# Patient Record
Sex: Female | Born: 1935
Health system: Southern US, Community
[De-identification: ages and names within clinical notes are randomized; demographics above are authoritative.]

## PROBLEM LIST (undated history)

## (undated) ENCOUNTER — Emergency Department (HOSPITAL_COMMUNITY)
Admission: EM | Disposition: A | Discharge status: Skilled Nursing Facility | Payer: Medicare Other | Attending: Emergency Medicine | Admitting: Emergency Medicine

## (undated) DIAGNOSIS — K219 Gastro-esophageal reflux disease without esophagitis: Secondary | ICD-10-CM

## (undated) DIAGNOSIS — I2510 Atherosclerotic heart disease of native coronary artery without angina pectoris: Secondary | ICD-10-CM

## (undated) DIAGNOSIS — I219 Acute myocardial infarction, unspecified: Secondary | ICD-10-CM

## (undated) DIAGNOSIS — F32A Depression, unspecified: Secondary | ICD-10-CM

## (undated) DIAGNOSIS — M810 Age-related osteoporosis without current pathological fracture: Secondary | ICD-10-CM

## (undated) DIAGNOSIS — E785 Hyperlipidemia, unspecified: Secondary | ICD-10-CM

## (undated) DIAGNOSIS — I10 Essential (primary) hypertension: Secondary | ICD-10-CM

## (undated) DIAGNOSIS — I739 Peripheral vascular disease, unspecified: Secondary | ICD-10-CM

## (undated) DIAGNOSIS — L409 Psoriasis, unspecified: Secondary | ICD-10-CM

## (undated) DIAGNOSIS — E039 Hypothyroidism, unspecified: Secondary | ICD-10-CM

## (undated) DIAGNOSIS — F329 Major depressive disorder, single episode, unspecified: Secondary | ICD-10-CM

## (undated) HISTORY — PX: TUBAL LIGATION: SHX77

## (undated) HISTORY — PX: EYE SURGERY: SHX253

## (undated) HISTORY — DX: Hypothyroidism, unspecified: E03.9

## (undated) HISTORY — PX: CATARACT EXTRACTION: SUR2

## (undated) HISTORY — PX: SUBCLAVIAN ARTERY STENT: SHX2452

## (undated) HISTORY — DX: Peripheral vascular disease, unspecified: I73.9

## (undated) HISTORY — DX: Gastro-esophageal reflux disease without esophagitis: K21.9

## (undated) HISTORY — DX: Major depressive disorder, single episode, unspecified: F32.9

## (undated) HISTORY — PX: ABDOMINAL HYSTERECTOMY: SHX81

## (undated) HISTORY — DX: Hyperlipidemia, unspecified: E78.5

## (undated) HISTORY — PX: CORONARY ANGIOPLASTY WITH STENT PLACEMENT: SHX49

## (undated) HISTORY — DX: Depression, unspecified: F32.A

## (undated) HISTORY — DX: Age-related osteoporosis without current pathological fracture: M81.0

## (undated) HISTORY — DX: Acute myocardial infarction, unspecified: I21.9

## (undated) HISTORY — DX: Atherosclerotic heart disease of native coronary artery without angina pectoris: I25.10

## (undated) HISTORY — PX: LUNG LOBECTOMY: SHX167

## (undated) HISTORY — DX: Essential (primary) hypertension: I10

## (undated) HISTORY — DX: Psoriasis, unspecified: L40.9

---

## 1999-11-11 ENCOUNTER — Inpatient Hospital Stay (HOSPITAL_COMMUNITY): Admission: EM | Admit: 1999-11-11 | Discharge: 1999-11-17 | Payer: Self-pay | Admitting: Internal Medicine

## 2001-12-18 ENCOUNTER — Inpatient Hospital Stay (HOSPITAL_COMMUNITY): Admission: EM | Admit: 2001-12-18 | Discharge: 2001-12-24 | Payer: Self-pay | Admitting: Internal Medicine

## 2001-12-18 ENCOUNTER — Encounter: Payer: Self-pay | Admitting: Internal Medicine

## 2001-12-19 ENCOUNTER — Encounter: Payer: Self-pay | Admitting: Cardiology

## 2001-12-21 ENCOUNTER — Encounter: Payer: Self-pay | Admitting: Internal Medicine

## 2001-12-23 ENCOUNTER — Encounter: Payer: Self-pay | Admitting: Neurology

## 2002-01-15 ENCOUNTER — Ambulatory Visit (HOSPITAL_COMMUNITY): Admission: RE | Admit: 2002-01-15 | Discharge: 2002-01-16 | Payer: Self-pay | Admitting: *Deleted

## 2005-11-04 ENCOUNTER — Ambulatory Visit: Payer: Self-pay | Admitting: Cardiology

## 2007-04-23 ENCOUNTER — Ambulatory Visit: Payer: Self-pay | Admitting: Cardiology

## 2011-04-25 DIAGNOSIS — IMO0002 Reserved for concepts with insufficient information to code with codable children: Secondary | ICD-10-CM | POA: Diagnosis not present

## 2011-04-25 DIAGNOSIS — IMO0001 Reserved for inherently not codable concepts without codable children: Secondary | ICD-10-CM | POA: Diagnosis not present

## 2011-04-25 DIAGNOSIS — E7800 Pure hypercholesterolemia, unspecified: Secondary | ICD-10-CM | POA: Diagnosis not present

## 2011-04-25 DIAGNOSIS — Z79899 Other long term (current) drug therapy: Secondary | ICD-10-CM | POA: Diagnosis not present

## 2011-04-25 DIAGNOSIS — I739 Peripheral vascular disease, unspecified: Secondary | ICD-10-CM | POA: Diagnosis not present

## 2011-04-25 DIAGNOSIS — M1990 Unspecified osteoarthritis, unspecified site: Secondary | ICD-10-CM | POA: Diagnosis not present

## 2011-04-25 DIAGNOSIS — G622 Polyneuropathy due to other toxic agents: Secondary | ICD-10-CM | POA: Diagnosis not present

## 2011-04-25 DIAGNOSIS — M810 Age-related osteoporosis without current pathological fracture: Secondary | ICD-10-CM | POA: Diagnosis not present

## 2011-04-25 DIAGNOSIS — I10 Essential (primary) hypertension: Secondary | ICD-10-CM | POA: Diagnosis not present

## 2011-04-25 DIAGNOSIS — G619 Inflammatory polyneuropathy, unspecified: Secondary | ICD-10-CM | POA: Diagnosis not present

## 2011-04-25 DIAGNOSIS — E782 Mixed hyperlipidemia: Secondary | ICD-10-CM | POA: Diagnosis not present

## 2011-08-16 DIAGNOSIS — I10 Essential (primary) hypertension: Secondary | ICD-10-CM | POA: Diagnosis not present

## 2011-08-16 DIAGNOSIS — S96819A Strain of other specified muscles and tendons at ankle and foot level, unspecified foot, initial encounter: Secondary | ICD-10-CM | POA: Diagnosis not present

## 2011-08-16 DIAGNOSIS — S93499A Sprain of other ligament of unspecified ankle, initial encounter: Secondary | ICD-10-CM | POA: Diagnosis not present

## 2011-08-30 DIAGNOSIS — E782 Mixed hyperlipidemia: Secondary | ICD-10-CM | POA: Diagnosis not present

## 2011-08-30 DIAGNOSIS — I10 Essential (primary) hypertension: Secondary | ICD-10-CM | POA: Diagnosis not present

## 2011-09-06 DIAGNOSIS — I739 Peripheral vascular disease, unspecified: Secondary | ICD-10-CM | POA: Diagnosis not present

## 2011-09-06 DIAGNOSIS — G622 Polyneuropathy due to other toxic agents: Secondary | ICD-10-CM | POA: Diagnosis not present

## 2011-09-06 DIAGNOSIS — IMO0002 Reserved for concepts with insufficient information to code with codable children: Secondary | ICD-10-CM | POA: Diagnosis not present

## 2011-09-06 DIAGNOSIS — M1990 Unspecified osteoarthritis, unspecified site: Secondary | ICD-10-CM | POA: Diagnosis not present

## 2011-09-06 DIAGNOSIS — IMO0001 Reserved for inherently not codable concepts without codable children: Secondary | ICD-10-CM | POA: Diagnosis not present

## 2011-09-06 DIAGNOSIS — E782 Mixed hyperlipidemia: Secondary | ICD-10-CM | POA: Diagnosis not present

## 2011-09-06 DIAGNOSIS — L408 Other psoriasis: Secondary | ICD-10-CM | POA: Diagnosis not present

## 2011-09-06 DIAGNOSIS — G619 Inflammatory polyneuropathy, unspecified: Secondary | ICD-10-CM | POA: Diagnosis not present

## 2011-09-06 DIAGNOSIS — M810 Age-related osteoporosis without current pathological fracture: Secondary | ICD-10-CM | POA: Diagnosis not present

## 2011-10-23 DIAGNOSIS — Z23 Encounter for immunization: Secondary | ICD-10-CM | POA: Diagnosis not present

## 2012-01-01 DIAGNOSIS — E782 Mixed hyperlipidemia: Secondary | ICD-10-CM | POA: Diagnosis not present

## 2012-01-01 DIAGNOSIS — I10 Essential (primary) hypertension: Secondary | ICD-10-CM | POA: Diagnosis not present

## 2012-01-01 DIAGNOSIS — E039 Hypothyroidism, unspecified: Secondary | ICD-10-CM | POA: Diagnosis not present

## 2012-01-08 DIAGNOSIS — M1990 Unspecified osteoarthritis, unspecified site: Secondary | ICD-10-CM | POA: Diagnosis not present

## 2012-01-08 DIAGNOSIS — M810 Age-related osteoporosis without current pathological fracture: Secondary | ICD-10-CM | POA: Diagnosis not present

## 2012-01-08 DIAGNOSIS — G619 Inflammatory polyneuropathy, unspecified: Secondary | ICD-10-CM | POA: Diagnosis not present

## 2012-01-08 DIAGNOSIS — I739 Peripheral vascular disease, unspecified: Secondary | ICD-10-CM | POA: Diagnosis not present

## 2012-01-08 DIAGNOSIS — L408 Other psoriasis: Secondary | ICD-10-CM | POA: Diagnosis not present

## 2012-01-08 DIAGNOSIS — IMO0002 Reserved for concepts with insufficient information to code with codable children: Secondary | ICD-10-CM | POA: Diagnosis not present

## 2012-01-08 DIAGNOSIS — IMO0001 Reserved for inherently not codable concepts without codable children: Secondary | ICD-10-CM | POA: Diagnosis not present

## 2012-01-08 DIAGNOSIS — E782 Mixed hyperlipidemia: Secondary | ICD-10-CM | POA: Diagnosis not present

## 2012-05-13 DIAGNOSIS — I10 Essential (primary) hypertension: Secondary | ICD-10-CM | POA: Diagnosis not present

## 2012-05-13 DIAGNOSIS — E782 Mixed hyperlipidemia: Secondary | ICD-10-CM | POA: Diagnosis not present

## 2012-05-20 DIAGNOSIS — M1990 Unspecified osteoarthritis, unspecified site: Secondary | ICD-10-CM | POA: Diagnosis not present

## 2012-05-20 DIAGNOSIS — L408 Other psoriasis: Secondary | ICD-10-CM | POA: Diagnosis not present

## 2012-05-20 DIAGNOSIS — IMO0002 Reserved for concepts with insufficient information to code with codable children: Secondary | ICD-10-CM | POA: Diagnosis not present

## 2012-05-20 DIAGNOSIS — IMO0001 Reserved for inherently not codable concepts without codable children: Secondary | ICD-10-CM | POA: Diagnosis not present

## 2012-05-20 DIAGNOSIS — I739 Peripheral vascular disease, unspecified: Secondary | ICD-10-CM | POA: Diagnosis not present

## 2012-05-20 DIAGNOSIS — G619 Inflammatory polyneuropathy, unspecified: Secondary | ICD-10-CM | POA: Diagnosis not present

## 2012-05-20 DIAGNOSIS — E782 Mixed hyperlipidemia: Secondary | ICD-10-CM | POA: Diagnosis not present

## 2012-05-20 DIAGNOSIS — M810 Age-related osteoporosis without current pathological fracture: Secondary | ICD-10-CM | POA: Diagnosis not present

## 2012-07-01 DIAGNOSIS — M810 Age-related osteoporosis without current pathological fracture: Secondary | ICD-10-CM | POA: Diagnosis not present

## 2012-07-01 DIAGNOSIS — L408 Other psoriasis: Secondary | ICD-10-CM | POA: Diagnosis not present

## 2012-07-01 DIAGNOSIS — G619 Inflammatory polyneuropathy, unspecified: Secondary | ICD-10-CM | POA: Diagnosis not present

## 2012-07-01 DIAGNOSIS — E782 Mixed hyperlipidemia: Secondary | ICD-10-CM | POA: Diagnosis not present

## 2012-07-01 DIAGNOSIS — M1990 Unspecified osteoarthritis, unspecified site: Secondary | ICD-10-CM | POA: Diagnosis not present

## 2012-07-01 DIAGNOSIS — IMO0001 Reserved for inherently not codable concepts without codable children: Secondary | ICD-10-CM | POA: Diagnosis not present

## 2012-07-01 DIAGNOSIS — I739 Peripheral vascular disease, unspecified: Secondary | ICD-10-CM | POA: Diagnosis not present

## 2012-07-01 DIAGNOSIS — G622 Polyneuropathy due to other toxic agents: Secondary | ICD-10-CM | POA: Diagnosis not present

## 2012-07-01 DIAGNOSIS — IMO0002 Reserved for concepts with insufficient information to code with codable children: Secondary | ICD-10-CM | POA: Diagnosis not present

## 2012-11-16 DIAGNOSIS — Z23 Encounter for immunization: Secondary | ICD-10-CM | POA: Diagnosis not present

## 2012-11-17 DIAGNOSIS — I10 Essential (primary) hypertension: Secondary | ICD-10-CM | POA: Diagnosis not present

## 2012-11-17 DIAGNOSIS — E782 Mixed hyperlipidemia: Secondary | ICD-10-CM | POA: Diagnosis not present

## 2012-11-25 DIAGNOSIS — IMO0002 Reserved for concepts with insufficient information to code with codable children: Secondary | ICD-10-CM | POA: Diagnosis not present

## 2012-11-25 DIAGNOSIS — L408 Other psoriasis: Secondary | ICD-10-CM | POA: Diagnosis not present

## 2012-11-25 DIAGNOSIS — IMO0001 Reserved for inherently not codable concepts without codable children: Secondary | ICD-10-CM | POA: Diagnosis not present

## 2012-11-25 DIAGNOSIS — I739 Peripheral vascular disease, unspecified: Secondary | ICD-10-CM | POA: Diagnosis not present

## 2012-11-25 DIAGNOSIS — G619 Inflammatory polyneuropathy, unspecified: Secondary | ICD-10-CM | POA: Diagnosis not present

## 2012-11-25 DIAGNOSIS — M810 Age-related osteoporosis without current pathological fracture: Secondary | ICD-10-CM | POA: Diagnosis not present

## 2012-11-25 DIAGNOSIS — M1990 Unspecified osteoarthritis, unspecified site: Secondary | ICD-10-CM | POA: Diagnosis not present

## 2012-11-25 DIAGNOSIS — E782 Mixed hyperlipidemia: Secondary | ICD-10-CM | POA: Diagnosis not present

## 2013-02-01 DIAGNOSIS — Z1231 Encounter for screening mammogram for malignant neoplasm of breast: Secondary | ICD-10-CM | POA: Diagnosis not present

## 2013-02-25 DIAGNOSIS — Z79899 Other long term (current) drug therapy: Secondary | ICD-10-CM | POA: Diagnosis not present

## 2013-02-25 DIAGNOSIS — Z7982 Long term (current) use of aspirin: Secondary | ICD-10-CM | POA: Diagnosis not present

## 2013-02-25 DIAGNOSIS — Z87891 Personal history of nicotine dependence: Secondary | ICD-10-CM | POA: Diagnosis not present

## 2013-02-25 DIAGNOSIS — M810 Age-related osteoporosis without current pathological fracture: Secondary | ICD-10-CM | POA: Diagnosis not present

## 2013-02-25 DIAGNOSIS — Z780 Asymptomatic menopausal state: Secondary | ICD-10-CM | POA: Diagnosis not present

## 2013-02-26 DIAGNOSIS — R197 Diarrhea, unspecified: Secondary | ICD-10-CM | POA: Diagnosis not present

## 2013-03-29 DIAGNOSIS — I10 Essential (primary) hypertension: Secondary | ICD-10-CM | POA: Diagnosis not present

## 2013-03-29 DIAGNOSIS — E782 Mixed hyperlipidemia: Secondary | ICD-10-CM | POA: Diagnosis not present

## 2013-03-29 DIAGNOSIS — E039 Hypothyroidism, unspecified: Secondary | ICD-10-CM | POA: Diagnosis not present

## 2013-03-29 DIAGNOSIS — K2100 Gastro-esophageal reflux disease with esophagitis, without bleeding: Secondary | ICD-10-CM | POA: Diagnosis not present

## 2013-04-01 DIAGNOSIS — G619 Inflammatory polyneuropathy, unspecified: Secondary | ICD-10-CM | POA: Diagnosis not present

## 2013-04-01 DIAGNOSIS — Z1331 Encounter for screening for depression: Secondary | ICD-10-CM | POA: Diagnosis not present

## 2013-04-01 DIAGNOSIS — M810 Age-related osteoporosis without current pathological fracture: Secondary | ICD-10-CM | POA: Diagnosis not present

## 2013-04-01 DIAGNOSIS — E782 Mixed hyperlipidemia: Secondary | ICD-10-CM | POA: Diagnosis not present

## 2013-04-01 DIAGNOSIS — M1990 Unspecified osteoarthritis, unspecified site: Secondary | ICD-10-CM | POA: Diagnosis not present

## 2013-04-01 DIAGNOSIS — I739 Peripheral vascular disease, unspecified: Secondary | ICD-10-CM | POA: Diagnosis not present

## 2013-04-01 DIAGNOSIS — G622 Polyneuropathy due to other toxic agents: Secondary | ICD-10-CM | POA: Diagnosis not present

## 2013-04-01 DIAGNOSIS — IMO0002 Reserved for concepts with insufficient information to code with codable children: Secondary | ICD-10-CM | POA: Diagnosis not present

## 2013-04-01 DIAGNOSIS — IMO0001 Reserved for inherently not codable concepts without codable children: Secondary | ICD-10-CM | POA: Diagnosis not present

## 2013-08-19 DIAGNOSIS — E039 Hypothyroidism, unspecified: Secondary | ICD-10-CM | POA: Diagnosis not present

## 2013-08-19 DIAGNOSIS — E782 Mixed hyperlipidemia: Secondary | ICD-10-CM | POA: Diagnosis not present

## 2013-08-19 DIAGNOSIS — K2100 Gastro-esophageal reflux disease with esophagitis, without bleeding: Secondary | ICD-10-CM | POA: Diagnosis not present

## 2013-08-19 DIAGNOSIS — M1990 Unspecified osteoarthritis, unspecified site: Secondary | ICD-10-CM | POA: Diagnosis not present

## 2013-08-19 DIAGNOSIS — I10 Essential (primary) hypertension: Secondary | ICD-10-CM | POA: Diagnosis not present

## 2013-08-26 DIAGNOSIS — G619 Inflammatory polyneuropathy, unspecified: Secondary | ICD-10-CM | POA: Diagnosis not present

## 2013-08-26 DIAGNOSIS — IMO0001 Reserved for inherently not codable concepts without codable children: Secondary | ICD-10-CM | POA: Diagnosis not present

## 2013-08-26 DIAGNOSIS — L408 Other psoriasis: Secondary | ICD-10-CM | POA: Diagnosis not present

## 2013-08-26 DIAGNOSIS — E782 Mixed hyperlipidemia: Secondary | ICD-10-CM | POA: Diagnosis not present

## 2013-08-26 DIAGNOSIS — M810 Age-related osteoporosis without current pathological fracture: Secondary | ICD-10-CM | POA: Diagnosis not present

## 2013-08-26 DIAGNOSIS — I739 Peripheral vascular disease, unspecified: Secondary | ICD-10-CM | POA: Diagnosis not present

## 2013-08-26 DIAGNOSIS — M1990 Unspecified osteoarthritis, unspecified site: Secondary | ICD-10-CM | POA: Diagnosis not present

## 2013-08-26 DIAGNOSIS — IMO0002 Reserved for concepts with insufficient information to code with codable children: Secondary | ICD-10-CM | POA: Diagnosis not present

## 2013-08-26 DIAGNOSIS — G622 Polyneuropathy due to other toxic agents: Secondary | ICD-10-CM | POA: Diagnosis not present

## 2013-10-27 DIAGNOSIS — Z23 Encounter for immunization: Secondary | ICD-10-CM | POA: Diagnosis not present

## 2013-11-18 DIAGNOSIS — H35371 Puckering of macula, right eye: Secondary | ICD-10-CM | POA: Diagnosis not present

## 2013-12-24 DIAGNOSIS — E782 Mixed hyperlipidemia: Secondary | ICD-10-CM | POA: Diagnosis not present

## 2013-12-24 DIAGNOSIS — I10 Essential (primary) hypertension: Secondary | ICD-10-CM | POA: Diagnosis not present

## 2013-12-24 DIAGNOSIS — K210 Gastro-esophageal reflux disease with esophagitis: Secondary | ICD-10-CM | POA: Diagnosis not present

## 2013-12-24 DIAGNOSIS — E039 Hypothyroidism, unspecified: Secondary | ICD-10-CM | POA: Diagnosis not present

## 2013-12-30 DIAGNOSIS — I739 Peripheral vascular disease, unspecified: Secondary | ICD-10-CM | POA: Diagnosis not present

## 2013-12-30 DIAGNOSIS — F325 Major depressive disorder, single episode, in full remission: Secondary | ICD-10-CM | POA: Diagnosis not present

## 2013-12-30 DIAGNOSIS — Z9189 Other specified personal risk factors, not elsewhere classified: Secondary | ICD-10-CM | POA: Diagnosis not present

## 2013-12-30 DIAGNOSIS — E039 Hypothyroidism, unspecified: Secondary | ICD-10-CM | POA: Diagnosis not present

## 2013-12-30 DIAGNOSIS — K210 Gastro-esophageal reflux disease with esophagitis: Secondary | ICD-10-CM | POA: Diagnosis not present

## 2013-12-30 DIAGNOSIS — E782 Mixed hyperlipidemia: Secondary | ICD-10-CM | POA: Diagnosis not present

## 2013-12-30 DIAGNOSIS — G619 Inflammatory polyneuropathy, unspecified: Secondary | ICD-10-CM | POA: Diagnosis not present

## 2013-12-30 DIAGNOSIS — I10 Essential (primary) hypertension: Secondary | ICD-10-CM | POA: Diagnosis not present

## 2014-04-12 DIAGNOSIS — Z0001 Encounter for general adult medical examination with abnormal findings: Secondary | ICD-10-CM | POA: Diagnosis not present

## 2014-04-12 DIAGNOSIS — I10 Essential (primary) hypertension: Secondary | ICD-10-CM | POA: Diagnosis not present

## 2014-04-12 DIAGNOSIS — E039 Hypothyroidism, unspecified: Secondary | ICD-10-CM | POA: Diagnosis not present

## 2014-04-12 DIAGNOSIS — F325 Major depressive disorder, single episode, in full remission: Secondary | ICD-10-CM | POA: Diagnosis not present

## 2014-04-12 DIAGNOSIS — E782 Mixed hyperlipidemia: Secondary | ICD-10-CM | POA: Diagnosis not present

## 2014-04-12 DIAGNOSIS — L400 Psoriasis vulgaris: Secondary | ICD-10-CM | POA: Diagnosis not present

## 2014-04-12 DIAGNOSIS — I2510 Atherosclerotic heart disease of native coronary artery without angina pectoris: Secondary | ICD-10-CM | POA: Diagnosis not present

## 2014-04-12 DIAGNOSIS — I739 Peripheral vascular disease, unspecified: Secondary | ICD-10-CM | POA: Diagnosis not present

## 2014-04-12 DIAGNOSIS — G619 Inflammatory polyneuropathy, unspecified: Secondary | ICD-10-CM | POA: Diagnosis not present

## 2014-04-12 DIAGNOSIS — Z1389 Encounter for screening for other disorder: Secondary | ICD-10-CM | POA: Diagnosis not present

## 2014-04-12 DIAGNOSIS — L900 Lichen sclerosus et atrophicus: Secondary | ICD-10-CM | POA: Diagnosis not present

## 2014-04-12 DIAGNOSIS — K219 Gastro-esophageal reflux disease without esophagitis: Secondary | ICD-10-CM | POA: Diagnosis not present

## 2014-04-12 DIAGNOSIS — K210 Gastro-esophageal reflux disease with esophagitis: Secondary | ICD-10-CM | POA: Diagnosis not present

## 2014-04-15 DIAGNOSIS — Z1231 Encounter for screening mammogram for malignant neoplasm of breast: Secondary | ICD-10-CM | POA: Diagnosis not present

## 2014-08-16 DIAGNOSIS — K219 Gastro-esophageal reflux disease without esophagitis: Secondary | ICD-10-CM | POA: Diagnosis not present

## 2014-08-16 DIAGNOSIS — E782 Mixed hyperlipidemia: Secondary | ICD-10-CM | POA: Diagnosis not present

## 2014-08-16 DIAGNOSIS — I10 Essential (primary) hypertension: Secondary | ICD-10-CM | POA: Diagnosis not present

## 2014-08-16 DIAGNOSIS — E039 Hypothyroidism, unspecified: Secondary | ICD-10-CM | POA: Diagnosis not present

## 2014-08-23 DIAGNOSIS — I739 Peripheral vascular disease, unspecified: Secondary | ICD-10-CM | POA: Diagnosis not present

## 2014-08-23 DIAGNOSIS — L900 Lichen sclerosus et atrophicus: Secondary | ICD-10-CM | POA: Diagnosis not present

## 2014-08-23 DIAGNOSIS — I2510 Atherosclerotic heart disease of native coronary artery without angina pectoris: Secondary | ICD-10-CM | POA: Diagnosis not present

## 2014-08-23 DIAGNOSIS — K219 Gastro-esophageal reflux disease without esophagitis: Secondary | ICD-10-CM | POA: Diagnosis not present

## 2014-08-23 DIAGNOSIS — F331 Major depressive disorder, recurrent, moderate: Secondary | ICD-10-CM | POA: Diagnosis not present

## 2014-08-23 DIAGNOSIS — L400 Psoriasis vulgaris: Secondary | ICD-10-CM | POA: Diagnosis not present

## 2014-08-23 DIAGNOSIS — E782 Mixed hyperlipidemia: Secondary | ICD-10-CM | POA: Diagnosis not present

## 2014-08-23 DIAGNOSIS — E039 Hypothyroidism, unspecified: Secondary | ICD-10-CM | POA: Diagnosis not present

## 2014-12-20 DIAGNOSIS — E782 Mixed hyperlipidemia: Secondary | ICD-10-CM | POA: Diagnosis not present

## 2014-12-20 DIAGNOSIS — K210 Gastro-esophageal reflux disease with esophagitis: Secondary | ICD-10-CM | POA: Diagnosis not present

## 2014-12-20 DIAGNOSIS — I10 Essential (primary) hypertension: Secondary | ICD-10-CM | POA: Diagnosis not present

## 2014-12-20 DIAGNOSIS — E039 Hypothyroidism, unspecified: Secondary | ICD-10-CM | POA: Diagnosis not present

## 2014-12-26 DIAGNOSIS — E039 Hypothyroidism, unspecified: Secondary | ICD-10-CM | POA: Diagnosis not present

## 2014-12-26 DIAGNOSIS — Z23 Encounter for immunization: Secondary | ICD-10-CM | POA: Diagnosis not present

## 2014-12-26 DIAGNOSIS — L400 Psoriasis vulgaris: Secondary | ICD-10-CM | POA: Diagnosis not present

## 2014-12-26 DIAGNOSIS — K219 Gastro-esophageal reflux disease without esophagitis: Secondary | ICD-10-CM | POA: Diagnosis not present

## 2014-12-26 DIAGNOSIS — I739 Peripheral vascular disease, unspecified: Secondary | ICD-10-CM | POA: Diagnosis not present

## 2014-12-26 DIAGNOSIS — I25111 Atherosclerotic heart disease of native coronary artery with angina pectoris with documented spasm: Secondary | ICD-10-CM | POA: Diagnosis not present

## 2014-12-26 DIAGNOSIS — E782 Mixed hyperlipidemia: Secondary | ICD-10-CM | POA: Diagnosis not present

## 2014-12-26 DIAGNOSIS — F331 Major depressive disorder, recurrent, moderate: Secondary | ICD-10-CM | POA: Diagnosis not present

## 2014-12-29 ENCOUNTER — Encounter: Payer: Self-pay | Admitting: Internal Medicine

## 2015-01-17 ENCOUNTER — Other Ambulatory Visit: Payer: Self-pay

## 2015-01-17 ENCOUNTER — Encounter: Payer: Self-pay | Admitting: Gastroenterology

## 2015-01-17 ENCOUNTER — Ambulatory Visit (INDEPENDENT_AMBULATORY_CARE_PROVIDER_SITE_OTHER): Payer: Medicare Other | Admitting: Gastroenterology

## 2015-01-17 ENCOUNTER — Encounter: Payer: Self-pay | Admitting: Internal Medicine

## 2015-01-17 VITALS — BP 172/72 | HR 60 | Temp 96.7°F | Ht 61.0 in | Wt 126.4 lb

## 2015-01-17 DIAGNOSIS — K529 Noninfective gastroenteritis and colitis, unspecified: Secondary | ICD-10-CM | POA: Diagnosis not present

## 2015-01-17 DIAGNOSIS — R197 Diarrhea, unspecified: Secondary | ICD-10-CM

## 2015-01-17 MED ORDER — PEG 3350-KCL-NA BICARB-NACL 420 G PO SOLR: 4000.0000 mL | ORAL | Status: DC

## 2015-01-17 NOTE — Progress Notes (Signed)
Primary Care Physician:  Donzetta SprungANIEL, TERRY, MD Primary Gastroenterologist:  Dr. Jena Gaussourk   Chief Complaint  Patient presents with  . Diarrhea  . set up TCS    HPI:   Melissa Bentley is a 81 y.o. female presenting today at the request of her PCP secondary to diarrhea.   Has had severe diarrhea for over a year. States she has been tried on multiple agents. States she had stool studies "quite awhile ago". Has to wear a pad when she leaves the house. Has fecal incontinence. Every day "running". Will sometimes be up to 7-8 times. Lately has been taking Imodium after a big bowel movement, and it slows it down for 2 days at the most. Has been embarrassing. Has funny feelings in stomach but no abdominal pain. Baseline bowel habits for patient had been constipation historically. No hematochezia. States weight has slowly been decreasing. Food doesn't taste good to her. Eats like a bird. No relation of diarrhea with eating. No aggravating factors. Has had episodes of getting hot, sweaty, shaky, then gets nauseated but no vomiting. Took a few bites of yogurt, which helped to calm down. States the first time it happened, she was worried about her heart. Believes she had a [facility] past.   Past Medical History  Diagnosis Date  . CAD (coronary artery disease)   . GERD (gastroesophageal reflux disease)   . Osteoporosis   . Hypothyroidism   . Hyperlipidemia   . Hypertension   . Depression   . PVD (peripheral vascular disease) (HCC)   . Psoriasis   . MI (myocardial infarction) [facility](HCC)     Past Surgical History  Procedure Laterality Date  . Abdominal hysterectomy    . Lung lobectomy    . Tubal ligation    . Coronary angioplasty with stent placement    . Subclavian artery stent      Current Outpatient Prescriptions  Medication Sig Dispense Refill  . aspirin 81 MG tablet Take 81 mg by mouth daily.    Marland Kitchen. atorvastatin (LIPITOR) 40 MG tablet     . cilostazol (PLETAL) 100  MG tablet     . citalopram (CELEXA) 20 MG tablet     . clopidogrel (PLAVIX) 75 MG tablet     . levothyroxine (SYNTHROID, LEVOTHROID) 75 MCG tablet     . lisinopril (PRINIVIL,ZESTRIL) 40 MG tablet     . metoprolol (LOPRESSOR) 50 MG tablet     . omeprazole (PRILOSEC) 20 MG capsule Reported on 01/17/2015     No current facility-administered medications for this visit.    Allergies as of 01/17/2015  . (No Known Allergies)    Family History  Problem Relation Age of Onset  . Colon cancer Neg Hx   . Lung cancer Sister     Social History   Social History  . Marital Status: Widowed    Spouse Name: N/A  . Number of Children: N/A  . Years of Education: N/A   Occupational History  . Not on file.   Social History Main Topics  . Smoking status: Former Smoker    Quit date: 01/17/1996  . Smokeless tobacco: Not on file  . Alcohol Use: No  . Drug Use: No  . Sexual Activity: Not on file   Other Topics Concern  . Not on file   Social History Narrative  . No narrative on file    Review of Systems: Gen: see HPI  CV: Denies chest pain, heart  palpitations, peripheral edema, syncope.  Resp: +cough occasionally, DOE GI: see HPI  GU : Denies urinary burning, urinary frequency, urinary hesitancy MS: +joint pain  Derm: Denies rash, itching, dry skin Psych: Denies depression, anxiety, memory loss, and confusion Heme: Denies bruising, bleeding, and enlarged lymph nodes.  Physical Exam: BP 172/72 mmHg  Pulse 60  Temp(Src) 96.7 F (35.9 C)  Ht  (1.549 m)  Wt 126 lb 6.4 oz (57.335 kg)  BMI 23.90 kg/m2 General:   Alert and oriented. Pleasant and cooperative. Well-nourished and well-developed.  Head:  Normocephalic and atraumatic. Eyes:  Without icterus, sclera clear and conjunctiva pink.  Ears:  Normal auditory acuity. Nose:  No deformity, discharge,  or lesions. Mouth:  No deformity or lesions, oral mucosa pink.  Lungs:  Clear to auscultation bilaterally. No wheezes, rales,  or rhonchi. No distress.  Heart:  S1, S2 present without murmurs appreciated.  Abdomen:  +BS, soft, non-tender and non-distended. No HSM noted. No guarding or rebound. No masses appreciated.  Rectal:  Deferred  Msk:  Symmetrical without gross deformities. Normal posture. Extremities:  Without edema. Neurologic:  Alert and  oriented x4;  grossly normal neurologically. Psych:  Alert and cooperative. Normal mood and affect.   Outside labs: requested Outside stool studies: requested

## 2015-01-17 NOTE — Patient Instructions (Signed)
We have scheduled you for a colonoscopy with Dr. Jena Gaussourk in the near future.  I am getting copies of your prior stool studies and labs. I will call you once we have samples of a new medication I would like you to try, only after I review the stool studies.

## 2015-01-27 NOTE — Assessment & Plan Note (Addendum)
81 year old female with severe diarrhea for at least a year, failing multiple OTC agents. Associated incontinence. [facility], and I have requested procedure notes. Patient also tells me she had stool studies a few months ago that were normal, but I do not have this either. Doubt dealing with infectious etiology due to chronicity. I have requested stool studies regardless. She needs an updated colonoscopy with random colonic biopsies to evaluate for microscopic colitis or other etiology. If stool studies were negative and colonoscopy benign, could trial Viberzi 75 mg po BID.   Proceed with TCS with Dr. Jena Gaussourk in near future: the risks, benefits, and alternatives have been discussed with the patient in detail. The patient states understanding and desires to proceed. Attempt to obtain outside colonoscopy report and labs Follow-up with me after colonoscopy

## 2015-01-31 NOTE — Progress Notes (Signed)
cc'ed to pcp °

## 2015-02-01 ENCOUNTER — Encounter (HOSPITAL_COMMUNITY)
Admission: RE | Disposition: A | Discharge status: Home / Self Care | Payer: Self-pay | Source: Ambulatory Visit | Attending: Internal Medicine

## 2015-02-01 ENCOUNTER — Ambulatory Visit (HOSPITAL_COMMUNITY)
Admission: RE | Admit: 2015-02-01 | Discharge: 2015-02-01 | Disposition: A | Discharge status: Home / Self Care | Payer: Medicare Other | Source: Ambulatory Visit | Attending: Internal Medicine | Admitting: Internal Medicine

## 2015-02-01 ENCOUNTER — Encounter (HOSPITAL_COMMUNITY): Payer: Self-pay | Admitting: *Deleted

## 2015-02-01 DIAGNOSIS — E039 Hypothyroidism, unspecified: Secondary | ICD-10-CM | POA: Insufficient documentation

## 2015-02-01 DIAGNOSIS — F329 Major depressive disorder, single episode, unspecified: Secondary | ICD-10-CM | POA: Insufficient documentation

## 2015-02-01 DIAGNOSIS — K5730 Diverticulosis of large intestine without perforation or abscess without bleeding: Secondary | ICD-10-CM | POA: Diagnosis not present

## 2015-02-01 DIAGNOSIS — E785 Hyperlipidemia, unspecified: Secondary | ICD-10-CM | POA: Diagnosis not present

## 2015-02-01 DIAGNOSIS — Z79899 Other long term (current) drug therapy: Secondary | ICD-10-CM | POA: Diagnosis not present

## 2015-02-01 DIAGNOSIS — Z87891 Personal history of nicotine dependence: Secondary | ICD-10-CM | POA: Diagnosis not present

## 2015-02-01 DIAGNOSIS — Z7982 Long term (current) use of aspirin: Secondary | ICD-10-CM | POA: Diagnosis not present

## 2015-02-01 DIAGNOSIS — I10 Essential (primary) hypertension: Secondary | ICD-10-CM | POA: Diagnosis not present

## 2015-02-01 DIAGNOSIS — R197 Diarrhea, unspecified: Secondary | ICD-10-CM | POA: Diagnosis not present

## 2015-02-01 DIAGNOSIS — K219 Gastro-esophageal reflux disease without esophagitis: Secondary | ICD-10-CM | POA: Insufficient documentation

## 2015-02-01 DIAGNOSIS — Z801 Family history of malignant neoplasm of trachea, bronchus and lung: Secondary | ICD-10-CM | POA: Diagnosis not present

## 2015-02-01 HISTORY — PX: COLONOSCOPY: SHX5424

## 2015-02-01 SURGERY — COLONOSCOPY
Anesthesia: Moderate Sedation

## 2015-02-01 MED ORDER — SODIUM CHLORIDE 0.9 % IV SOLN
INTRAVENOUS | Status: DC
  Administered 2015-02-01: 1000 mL via INTRAVENOUS

## 2015-02-01 MED ORDER — MEPERIDINE HCL 100 MG/ML IJ SOLN
INTRAMUSCULAR | Status: DC
Start: 2015-02-01 — End: 2015-02-01
  Filled 2015-02-01: qty 2

## 2015-02-01 MED ORDER — ONDANSETRON HCL 4 MG/2ML IJ SOLN
INTRAMUSCULAR | Status: AC
  Filled 2015-02-01: qty 2

## 2015-02-01 MED ORDER — ONDANSETRON HCL 4 MG/2ML IJ SOLN
INTRAMUSCULAR | Status: DC | PRN
  Administered 2015-02-01: 4 mg via INTRAVENOUS

## 2015-02-01 MED ORDER — STERILE WATER FOR IRRIGATION IR SOLN
Status: DC | PRN
  Administered 2015-02-01: 13:00:00

## 2015-02-01 MED ORDER — MIDAZOLAM HCL 5 MG/5ML IJ SOLN
INTRAMUSCULAR | Status: DC | PRN
  Administered 2015-02-01 (×2): 1 mg via INTRAVENOUS

## 2015-02-01 MED ORDER — MEPERIDINE HCL 100 MG/ML IJ SOLN
INTRAMUSCULAR | Status: DC | PRN
  Administered 2015-02-01 (×2): 25 mg via INTRAVENOUS

## 2015-02-01 MED ORDER — MIDAZOLAM HCL 5 MG/5ML IJ SOLN
INTRAMUSCULAR | Status: DC
Start: 2015-02-01 — End: 2015-02-01
  Filled 2015-02-01: qty 10

## 2015-02-01 NOTE — H&P (View-Only) (Signed)
Primary Care Physician:  Donzetta SprungANIEL, TERRY, MD Primary Gastroenterologist:  Dr. Jena Gaussourk   Chief Complaint  Patient presents with  . Diarrhea  . set up TCS    HPI:   Melissa Bentley is a 82 y.o. female presenting today at the request of her PCP secondary to diarrhea.   Has had severe diarrhea for over a year. States she has been tried on multiple agents. States she had stool studies "quite awhile ago". Has to wear a pad when she leaves the house. Has fecal incontinence. Every day "running". Will sometimes be up to 7-8 times. Lately has been taking Imodium after a big bowel movement, and it slows it down for 2 days at the most. Has been embarrassing. Has funny feelings in stomach but no abdominal pain. Baseline bowel habits for patient had been constipation historically. No hematochezia. States weight has slowly been decreasing. Food doesn't taste good to her. Eats like a bird. No relation of diarrhea with eating. No aggravating factors. Has had episodes of getting hot, sweaty, shaky, then gets nauseated but no vomiting. Took a few bites of yogurt, which helped to calm down. States the first time it happened, she was worried about her heart. Believes she had a colonoscopy at [facility] in remote past.   Past Medical History  Diagnosis Date  . CAD (coronary artery disease)   . GERD (gastroesophageal reflux disease)   . Osteoporosis   . Hypothyroidism   . Hyperlipidemia   . Hypertension   . Depression   . PVD (peripheral vascular disease) (HCC)   . Psoriasis   . MI (myocardial infarction) [facility](HCC)     Past Surgical History  Procedure Laterality Date  . Abdominal hysterectomy    . Lung lobectomy    . Tubal ligation    . Coronary angioplasty with stent placement    . Subclavian artery stent      Current Outpatient Prescriptions  Medication Sig Dispense Refill  . aspirin 81 MG tablet Take 81 mg by mouth daily.    Marland Kitchen. atorvastatin (LIPITOR) 40 MG tablet     . cilostazol (PLETAL) 100  MG tablet     . citalopram (CELEXA) 20 MG tablet     . clopidogrel (PLAVIX) 75 MG tablet     . levothyroxine (SYNTHROID, LEVOTHROID) 75 MCG tablet     . lisinopril (PRINIVIL,ZESTRIL) 40 MG tablet     . metoprolol (LOPRESSOR) 50 MG tablet     . omeprazole (PRILOSEC) 20 MG capsule Reported on 01/17/2015     No current facility-administered medications for this visit.    Allergies as of 01/17/2015  . (No Known Allergies)    Family History  Problem Relation Age of Onset  . Colon cancer Neg Hx   . Lung cancer Sister     Social History   Social History  . Marital Status: Widowed    Spouse Name: N/A  . Number of Children: N/A  . Years of Education: N/A   Occupational History  . Not on file.   Social History Main Topics  . Smoking status: Former Smoker    Quit date: 01/17/1996  . Smokeless tobacco: Not on file  . Alcohol Use: No  . Drug Use: No  . Sexual Activity: Not on file   Other Topics Concern  . Not on file   Social History Narrative  . No narrative on file    Review of Systems: Gen: see HPI  CV: Denies chest pain, heart  palpitations, peripheral edema, syncope.  Resp: +cough occasionally, DOE GI: see HPI  GU : Denies urinary burning, urinary frequency, urinary hesitancy MS: +joint pain  Derm: Denies rash, itching, dry skin Psych: Denies depression, anxiety, memory loss, and confusion Heme: Denies bruising, bleeding, and enlarged lymph nodes.  Physical Exam: BP 172/72 mmHg  Pulse 60  Temp(Src) 96.7 F (35.9 C)  Ht 5\' 1"  (1.549 m)  Wt 126 lb 6.4 oz (57.335 kg)  BMI 23.90 kg/m2 General:   Alert and oriented. Pleasant and cooperative. Well-nourished and well-developed.  Head:  Normocephalic and atraumatic. Eyes:  Without icterus, sclera clear and conjunctiva pink.  Ears:  Normal auditory acuity. Nose:  No deformity, discharge,  or lesions. Mouth:  No deformity or lesions, oral mucosa pink.  Lungs:  Clear to auscultation bilaterally. No wheezes, rales,  or rhonchi. No distress.  Heart:  S1, S2 present without murmurs appreciated.  Abdomen:  +BS, soft, non-tender and non-distended. No HSM noted. No guarding or rebound. No masses appreciated.  Rectal:  Deferred  Msk:  Symmetrical without gross deformities. Normal posture. Extremities:  Without edema. Neurologic:  Alert and  oriented x4;  grossly normal neurologically. Psych:  Alert and cooperative. Normal mood and affect.   Outside labs: requested Outside stool studies: requested

## 2015-02-01 NOTE — Op Note (Signed)
[facility] [address] [place], [id number]   COLONOSCOPY PROCEDURE REPORT  PATIENT: Melissa Bentley, Melissa Bentley  MR#: [id number] BIRTHDATE: 02-13-1927 , 87  yrs. old GENDER: female ENDOSCOPIST: Bentley.  Roetta SessionsMichael Matina Rodier, MD FACP [facility] REFERRED XB:JYNWGBY:Terry Reuel Boomaniel, M.D. PROCEDURE DATE:  02/01/2015 PROCEDURE:   Colonoscopy with biopsy INDICATIONS:Chronic diarrhea. MEDICATIONS: Versed 2 mg IV and Demerol 50 mg IV in divided doses. Zofran 4 mg IV. ASA CLASS:       Class III  CONSENT: The risks, benefits, alternatives and imponderables including but not limited to bleeding, perforation as well as the possibility of a missed lesion have been reviewed.  The potential for biopsy, lesion removal, etc. have also been discussed. Questions have been answered.  All parties agreeable.  Please see the history and physical in the medical record for more information.  DESCRIPTION OF PROCEDURE:   After the risks benefits and alternatives of the procedure were thoroughly explained, informed consent was obtained.  The digital rectal exam revealed no abnormalities of the rectum.   The EC-[id number] ([id number]  endoscope was introduced through the anus and advanced to the cecum, which was identified by both the appendix and ileocecal valve. No adverse events experienced.   The quality of the prep was adequate  The instrument was then slowly withdrawn as the colon was fully examined. Estimated blood loss is zero unless otherwise noted in this procedure report.      COLON FINDINGS: Normal-appearing rectal mucosa.  Densely populated colonic diverticula from the rectosigmoid junction all the way to the cecum.  However, the colonic mucosa otherwise appeared normal. I attempted to intubate the terminal ileum but was unable to do so.  Segmental biopsies of the ascending and descending/sigmoid segments taken to evaluate for microscopic colitis.  Also, a stool sample was submitted for GI pathogen panel.   Retroflexion was performed. .   Withdrawal time=10 minutes 0 seconds.  The scope was withdrawn and the procedure completed. COMPLICATIONS: There were no immediate complications. EBL 3 mL ENDOSCOPIC IMPRESSION: Colonic diverticulosis. Status post segmental biopsy and stool sampling  RECOMMENDATIONS: Follow-up on pending studies.  eSigned:  R. Roetta SessionsMichael Kert Shackett, MD Jerrel IvoryFACP [facility] 02/01/2015 1:39 PM   cc:  CPT CODES: ICD CODES:  The ICD and CPT codes recommended by this software are interpretations from the data that the clinical staff has captured with the software.  The verification of the translation of this report to the ICD and CPT codes and modifiers is the sole responsibility of the health care institution and practicing physician where this report was generated.  [employer] will not be held responsible for the validity of the ICD and CPT codes included on this report.  AMA assumes no liability for data contained or not contained herein. CPT is a Publishing rights managerregistered trademark of the [employer].

## 2015-02-01 NOTE — Discharge Instructions (Signed)
Colonoscopy Discharge Instructions  Read the instructions outlined below and refer to this sheet in the next few weeks. These discharge instructions provide you with general information on caring for yourself after you leave the hospital. Your doctor may also give you specific instructions. While your treatment has been planned according to the most current medical practices available, unavoidable complications occasionally occur. If you have any problems or questions after discharge, call Dr. Jena Gauss at [phone]. ACTIVITY  You may resume your regular activity, but move at a slower pace for the next 24 hours.   Take frequent rest periods for the next 24 hours.   Walking will help get rid of the air and reduce the bloated feeling in your belly (abdomen).   No driving for 24 hours (because of the medicine (anesthesia) used during the test).    Do not sign any important legal documents or operate any machinery for 24 hours (because of the anesthesia used during the test).  NUTRITION  Drink plenty of fluids.   You may resume your normal diet as instructed by your doctor.   Begin with a light meal and progress to your normal diet. Heavy or fried foods are harder to digest and may make you feel sick to your stomach (nauseated).   Avoid alcoholic beverages for 24 hours or as instructed.  MEDICATIONS  You may resume your normal medications unless your doctor tells you otherwise.  WHAT YOU CAN EXPECT TODAY  Some feelings of bloating in the abdomen.   Passage of more gas than usual.   Spotting of blood in your stool or on the toilet paper.  IF YOU HAD POLYPS REMOVED DURING THE COLONOSCOPY:  No aspirin products for 7 days or as instructed.   No alcohol for 7 days or as instructed.   Eat a soft diet for the next 24 hours.  FINDING OUT THE RESULTS OF YOUR TEST Not all test results are available during your visit. If your test results are not back during the visit, make an appointment  with your caregiver to find out the results. Do not assume everything is normal if you have not heard from your caregiver or the medical facility. It is important for you to follow up on all of your test results.  SEEK IMMEDIATE MEDICAL ATTENTION IF:  You have more than a spotting of blood in your stool.   Your belly is swollen (abdominal distention).   You are nauseated or vomiting.   You have a temperature over 101.   You have abdominal pain or discomfort that is severe or gets worse throughout the day.    Diverticulosis information provided  Continue all of your regular medications including Plavix  Further recommendations to follow pending review of pathology report   Diverticulosis Diverticulosis is the condition that develops when small pouches (diverticula) form in the wall of your colon. Your colon, or large intestine, is where water is absorbed and stool is formed. The pouches form when the inside layer of your colon pushes through weak spots in the outer layers of your colon. CAUSES  No one knows exactly what causes diverticulosis. RISK FACTORS  Being older than 50. Your risk for this condition increases with age. Diverticulosis is rare in people younger than 40 years. By age 22, almost everyone has it.  Eating a low-fiber diet.  Being frequently constipated.  Being overweight.  Not getting enough exercise.  Smoking.  Taking over-the-counter pain medicines, like aspirin and ibuprofen. SYMPTOMS  Most people with  diverticulosis do not have symptoms. DIAGNOSIS  Because diverticulosis often has no symptoms, health care providers often discover the condition during an exam for other colon problems. In many cases, a health care provider will diagnose diverticulosis while using a flexible scope to examine the colon (colonoscopy). TREATMENT  If you have never developed an infection related to diverticulosis, you may not need treatment. If you have had an infection  before, treatment may include:  Eating more fruits, vegetables, and grains.  Taking a fiber supplement.  Taking a live bacteria supplement (probiotic).  Taking medicine to relax your colon. HOME CARE INSTRUCTIONS   Drink at least 6-8 glasses of water each day to prevent constipation.  Try not to strain when you have a bowel movement.  Keep all follow-up appointments. If you have had an infection before:  Increase the fiber in your diet as directed by your health care provider or dietitian.  Take a dietary fiber supplement if your health care provider approves.  Only take medicines as directed by your health care provider. SEEK MEDICAL CARE IF:   You have abdominal pain.  You have bloating.  You have cramps.  You have not gone to the bathroom in 3 days. SEEK IMMEDIATE MEDICAL CARE IF:   Your pain gets worse.  Yourbloating becomes very bad.  You have a fever or chills, and your symptoms suddenly get worse.  You begin vomiting.  You have bowel movements that are bloody or black. MAKE SURE YOU:  Understand these instructions.  Will watch your condition.  Will get help right away if you are not doing well or get worse.   This information is not intended to replace advice given to you by your health care provider. Make sure you discuss any questions you have with your health care provider.   Document Released: 10/12/2003 Document Revised: 01/19/2013 Document Reviewed: 12/09/2012 Elsevier Interactive Patient Education [employer]2016 Elsevier Inc.

## 2015-02-01 NOTE — Interval H&P Note (Signed)
History and Physical Interval Note:  02/01/2015 12:57 PM  Melissa Bentley  has presented today for surgery, with the diagnosis of diarrhea  The various methods of treatment have been discussed with the patient and family. After consideration of risks, benefits and other options for treatment, the patient has consented to  Procedure(s) with comments: COLONOSCOPY (N/A) - 145 - moved to 10:10 - office to notify  as a surgical intervention .  The patient's history has been reviewed, patient examined, no change in status, stable for surgery.  I have reviewed the patient's chart and labs.  Questions were answered to the patient's satisfaction.     Chistina Roston  No change. Diagnostic colonoscopy per plan.  The risks, benefits, limitations, alternatives and imponderables have been reviewed with the patient. Questions have been answered. All parties are agreeable.

## 2015-02-02 LAB — GASTROINTESTINAL PANEL BY PCR, STOOL (REPLACES STOOL CULTURE)

## 2015-02-05 ENCOUNTER — Encounter: Payer: Self-pay | Admitting: Internal Medicine

## 2015-02-06 ENCOUNTER — Encounter (HOSPITAL_COMMUNITY): Payer: Self-pay | Admitting: Internal Medicine

## 2015-02-09 ENCOUNTER — Telehealth: Payer: Self-pay | Admitting: Gastroenterology

## 2015-02-09 NOTE — Telephone Encounter (Signed)
GI pathogen panel completed, but as we now know (but didn't know then), Cdiff not included.  Needs Cdiff prior to appt on 1/18.

## 2015-02-15 ENCOUNTER — Ambulatory Visit (INDEPENDENT_AMBULATORY_CARE_PROVIDER_SITE_OTHER): Payer: Medicare Other | Admitting: Gastroenterology

## 2015-02-15 ENCOUNTER — Encounter: Payer: Self-pay | Admitting: Gastroenterology

## 2015-02-15 VITALS — BP 108/58 | HR 56 | Temp 97.9°F | Ht 61.0 in | Wt 122.0 lb

## 2015-02-15 DIAGNOSIS — K529 Noninfective gastroenteritis and colitis, unspecified: Secondary | ICD-10-CM

## 2015-02-15 MED ORDER — ELUXADOLINE 75 MG PO TABS: 1.0000 | ORAL_TABLET | Freq: Two times a day (BID) | ORAL | Status: DC

## 2015-02-15 NOTE — Patient Instructions (Signed)
Please complete the stool studies first.    I would like to get the results before you start the medication. Once we get the results, you can start Viberzi 1 tablet twice a day with food. Monitor for any severe abdominal pain or constipation without a bowel movement in 3-4 days. Call me if this happens.  I will see you back in 2 weeks.

## 2015-02-15 NOTE — Progress Notes (Signed)
Referring Provider: Caryl Bis, MD Primary Care Physician:  Gar Ponto, MD  Primary GI: Dr. Gala Romney   Chief Complaint  Patient presents with  . Diarrhea    HPI:   Melissa Bentley is an 82 y.o. female presenting today with a history of chronic diarrhea, with most recent colonoscopy revealing colonic diverticulosis but negative stool sampling. GI pathogen panel completed with negative results, but Cdiff PCR is no longer included in GI pathogen panel.   No change in bowel habits. Persistent loose stool. Sometimes fecal incontinence. Will take some imodium to try and slow it down. Down 4 lbs since last visit. Believes she may have lost 15-20 lbs in the past year. Will have mild abdominal discomfort occasionally.   Had a few episodes of lower abdominal discomfort, radiating up all over her, felt like she had to vomit but passed on its own. Last episode a month or more.   Past Medical History  Diagnosis Date  . CAD (coronary artery disease)   . GERD (gastroesophageal reflux disease)   . Osteoporosis   . Hypothyroidism   . Hyperlipidemia   . Hypertension   . Depression   . PVD (peripheral vascular disease) ([place])   . Psoriasis   . MI (myocardial infarction) [facility])     Past Surgical History  Procedure Laterality Date  . Abdominal hysterectomy    . Lung lobectomy    . Tubal ligation    . Coronary angioplasty with stent placement    . Subclavian artery stent    . Colonoscopy N/A 02/01/2015    Dr. Rourk:colonic diverticulosis s/p biopsy with unremarkable colonic mucosa, no microscopic colitis.     Current Outpatient Prescriptions  Medication Sig Dispense Refill  . aspirin 81 MG tablet Take 81 mg by mouth daily.    Marland Kitchen atorvastatin (LIPITOR) 40 MG tablet Take 40 mg by mouth daily at 6 PM.     . cilostazol (PLETAL) 100 MG tablet Take 100 mg by mouth 2 (two) times daily.     . citalopram (CELEXA) 20 MG tablet Take 20 mg by mouth daily.     . clopidogrel (PLAVIX) 75 MG tablet  Take 75 mg by mouth daily.     Marland Kitchen levothyroxine (SYNTHROID, LEVOTHROID) 75 MCG tablet Take 75 mcg by mouth daily before breakfast.     . lisinopril (PRINIVIL,ZESTRIL) 40 MG tablet Take 40 mg by mouth daily.     . metoprolol (LOPRESSOR) 50 MG tablet Take 50 mg by mouth 2 (two) times daily.     Marland Kitchen omeprazole (PRILOSEC) 20 MG capsule Take 20 mg by mouth daily. Reported on 01/17/2015    . pantoprazole (PROTONIX) 40 MG tablet Take 40 mg by mouth daily.    . polyethylene glycol-electrolytes (TRILYTE) 420 G solution Take 4,000 mLs by mouth as directed. (Patient not taking: Reported on 02/15/2015) 4000 mL 0   No current facility-administered medications for this visit.    Allergies as of 02/15/2015  . (No Known Allergies)    Family History  Problem Relation Age of Onset  . Colon cancer Neg Hx   . Lung cancer Sister     Social History   Social History  . Marital Status: Widowed    Spouse Name: N/A  . Number of Children: N/A  . Years of Education: N/A   Social History Main Topics  . Smoking status: Former Smoker    Quit date: 01/17/1996  . Smokeless tobacco: None  . Alcohol Use: No  .  Drug Use: No  . Sexual Activity: Not Asked   Other Topics Concern  . None   Social History Narrative    Review of Systems: Negative unless mentioned in HPI.   Physical Exam: BP 108/58 mmHg  Pulse 56  Temp(Src) 97.9 F (36.6 C) (Oral)  Ht _0  (1.549 m)  Wt 122 lb (55.339 kg)  BMI 23.06 kg/m2 General:   Alert and oriented. No distress noted. Pleasant and cooperative.  Head:  Normocephalic and atraumatic. Eyes:  Conjuctiva clear without scleral icterus. Abdomen:  +BS, soft, non-tender and non-distended. No rebound or guarding. No HSM or masses noted. Msk:  Symmetrical without gross deformities. Normal posture. Extremities:  Without edema. Neurologic:  Alert and  oriented x4;  grossly normal neurologically. Psych:  Alert and cooperative. Normal mood and affect.  Outside labs: per nurse  with Dr. Quillian Quince, TSH is 0.870 a few months ago. Nov 2016: BUN 12, Cr 0.70, Albumin 3.9, Tbili 0.4, Alk Phos 118, AST 16, ALT 6,

## 2015-02-15 NOTE — Assessment & Plan Note (Signed)
82 year old female with chronic diarrhea and recent colonoscopy on file without any significant findings. GI pathogen panel negative, but Cdiff PCR has not been checked as of yet (no longer in GI pathogen panel as thought). TSH normal recently. I highly doubt infectious etiology but would still like to obtain a Cdiff by PCR. Her weight loss over the past year and even since last appointment is concerning. Unable to rule out pancreatic insufficiency or other occult process. I will check Cdiff PCR and fecal elastase now. Once this is returned, will trial Viberzi 75 mg at the lower dose (even though she would be a candidate for 100 mg), and have her return in 2 weeks for close follow-up. I have a low threshold for pursuing a CT scan if there is not marked improvement at that time.

## 2015-02-15 NOTE — Telephone Encounter (Signed)
Was not able to reach pt prior to her appt. AS is aware. Pt given stool container at the ov.

## 2015-02-20 DIAGNOSIS — K529 Noninfective gastroenteritis and colitis, unspecified: Secondary | ICD-10-CM | POA: Diagnosis not present

## 2015-02-20 DIAGNOSIS — R197 Diarrhea, unspecified: Secondary | ICD-10-CM | POA: Diagnosis not present

## 2015-02-20 NOTE — Progress Notes (Signed)
CC'ED TO PCP 

## 2015-02-21 LAB — CLOSTRIDIUM DIFFICILE BY PCR: Toxigenic C. Difficile by PCR: NOT DETECTED

## 2015-02-24 LAB — PANCREATIC ELASTASE, FECAL: Pancreatic Elastase-1, Stool: 343 mcg/g

## 2015-03-01 ENCOUNTER — Encounter: Payer: Self-pay | Admitting: Gastroenterology

## 2015-03-01 ENCOUNTER — Ambulatory Visit (INDEPENDENT_AMBULATORY_CARE_PROVIDER_SITE_OTHER): Payer: Medicare Other | Admitting: Gastroenterology

## 2015-03-01 VITALS — BP 144/75 | HR 64 | Temp 97.1°F | Ht 61.0 in | Wt 125.4 lb

## 2015-03-01 DIAGNOSIS — R911 Solitary pulmonary nodule: Secondary | ICD-10-CM | POA: Diagnosis not present

## 2015-03-01 DIAGNOSIS — M546 Pain in thoracic spine: Secondary | ICD-10-CM | POA: Diagnosis not present

## 2015-03-01 DIAGNOSIS — E039 Hypothyroidism, unspecified: Secondary | ICD-10-CM | POA: Diagnosis not present

## 2015-03-01 DIAGNOSIS — S299XXA Unspecified injury of thorax, initial encounter: Secondary | ICD-10-CM | POA: Diagnosis not present

## 2015-03-01 DIAGNOSIS — Z87891 Personal history of nicotine dependence: Secondary | ICD-10-CM | POA: Diagnosis not present

## 2015-03-01 DIAGNOSIS — F329 Major depressive disorder, single episode, unspecified: Secondary | ICD-10-CM | POA: Diagnosis not present

## 2015-03-01 DIAGNOSIS — R197 Diarrhea, unspecified: Secondary | ICD-10-CM

## 2015-03-01 DIAGNOSIS — Z7902 Long term (current) use of antithrombotics/antiplatelets: Secondary | ICD-10-CM | POA: Diagnosis not present

## 2015-03-01 DIAGNOSIS — W010XXA Fall on same level from slipping, tripping and stumbling without subsequent striking against object, initial encounter: Secondary | ICD-10-CM | POA: Diagnosis not present

## 2015-03-01 DIAGNOSIS — I739 Peripheral vascular disease, unspecified: Secondary | ICD-10-CM | POA: Diagnosis not present

## 2015-03-01 DIAGNOSIS — I252 Old myocardial infarction: Secondary | ICD-10-CM | POA: Diagnosis not present

## 2015-03-01 DIAGNOSIS — M549 Dorsalgia, unspecified: Secondary | ICD-10-CM | POA: Diagnosis not present

## 2015-03-01 DIAGNOSIS — M533 Sacrococcygeal disorders, not elsewhere classified: Secondary | ICD-10-CM | POA: Diagnosis not present

## 2015-03-01 DIAGNOSIS — Z79899 Other long term (current) drug therapy: Secondary | ICD-10-CM | POA: Diagnosis not present

## 2015-03-01 DIAGNOSIS — S3993XA Unspecified injury of pelvis, initial encounter: Secondary | ICD-10-CM | POA: Diagnosis not present

## 2015-03-01 DIAGNOSIS — E7800 Pure hypercholesterolemia, unspecified: Secondary | ICD-10-CM | POA: Diagnosis not present

## 2015-03-01 DIAGNOSIS — S300XXA Contusion of lower back and pelvis, initial encounter: Secondary | ICD-10-CM | POA: Diagnosis not present

## 2015-03-01 DIAGNOSIS — I10 Essential (primary) hypertension: Secondary | ICD-10-CM | POA: Diagnosis not present

## 2015-03-01 DIAGNOSIS — S3992XA Unspecified injury of lower back, initial encounter: Secondary | ICD-10-CM | POA: Diagnosis not present

## 2015-03-01 MED ORDER — ELUXADOLINE 75 MG PO TABS: 1.0000 | ORAL_TABLET | Freq: Every day | ORAL | Status: DC

## 2015-03-01 NOTE — Progress Notes (Signed)
Referring Provider: Richardean Chimera, MD Primary Care Physician:  Donzetta Sprung, MD  Primary GI: Dr. Jena Gauss   Chief Complaint  Patient presents with  . Follow-up    HPI:   Melissa Bentley is an 82 y.o. female presenting today with a history of chronic diarrhea, with most recent colonoscopy revealing colonic diverticulosis but negative stool sampling. GI pathogen panel completed with negative results, Cdiff PCR negative. Normal pancreatic elastase.   Fell right before visit today. Tailbone hurts. Upper back hurts. Was trying to get out of tub. Started Viberzi 75 mg BID. Finished samples today that were given last week. Actually caused constipation. No abdominal pain but feels bloated. Last BM last week. No N/V.   Past Medical History  Diagnosis Date  . CAD (coronary artery disease)   . GERD (gastroesophageal reflux disease)   . Osteoporosis   . Hypothyroidism   . Hyperlipidemia   . Hypertension   . Depression   . PVD (peripheral vascular disease) ([facility])   . Psoriasis   . MI (myocardial infarction) [facility])     Past Surgical History  Procedure Laterality Date  . Abdominal hysterectomy    . Lung lobectomy    . Tubal ligation    . Coronary angioplasty with stent placement    . Subclavian artery stent    . Colonoscopy N/A 02/01/2015    Dr. Rourk:colonic diverticulosis s/p biopsy with unremarkable colonic mucosa, no microscopic colitis.     Current Outpatient Prescriptions  Medication Sig Dispense Refill  . aspirin 81 MG tablet Take 81 mg by mouth daily.    Marland Kitchen atorvastatin (LIPITOR) 40 MG tablet Take 40 mg by mouth daily at 6 PM.     . cilostazol (PLETAL) 100 MG tablet Take 100 mg by mouth 2 (two) times daily.     . citalopram (CELEXA) 20 MG tablet Take 20 mg by mouth daily.     . clopidogrel (PLAVIX) 75 MG tablet Take 75 mg by mouth daily.     . Eluxadoline (VIBERZI) 75 MG TABS Take 1 tablet by mouth 2 (two) times daily with a meal. 14 tablet 0  . levothyroxine (SYNTHROID,  LEVOTHROID) 75 MCG tablet Take 75 mcg by mouth daily before breakfast.     . lisinopril (PRINIVIL,ZESTRIL) 40 MG tablet Take 40 mg by mouth daily.     . metoprolol (LOPRESSOR) 50 MG tablet Take 50 mg by mouth 2 (two) times daily.     Marland Kitchen omeprazole (PRILOSEC) 20 MG capsule Take 20 mg by mouth daily. Reported on 01/17/2015    . pantoprazole (PROTONIX) 40 MG tablet Take 40 mg by mouth daily.    . polyethylene glycol-electrolytes (TRILYTE) 420 G solution Take 4,000 mLs by mouth as directed. (Patient not taking: Reported on 03/01/2015) 4000 mL 0   No current facility-administered medications for this visit.    Allergies as of 03/01/2015  . (No Known Allergies)    Family History  Problem Relation Age of Onset  . Colon cancer Neg Hx   . Lung cancer Sister     Social History   Social History  . Marital Status: Widowed    Spouse Name: N/A  . Number of Children: N/A  . Years of Education: N/A   Social History Main Topics  . Smoking status: Former Smoker    Quit date: 01/17/1996  . Smokeless tobacco: None  . Alcohol Use: No  . Drug Use: No  . Sexual Activity: Not Asked   Other Topics Concern  .  None   Social History Narrative    Review of Systems: As noted in HPI.   Physical Exam: BP 144/75 mmHg  Pulse 64  Temp(Src) 97.1 F (36.2 C)  Ht  (1.549 m)  Wt 125 lb 6.4 oz (56.881 kg)  BMI 23.71 kg/m2 General:   Alert and oriented. No distress noted. Pleasant and cooperative.  Head:  Normocephalic and atraumatic. Eyes:  Conjuctiva clear without scleral icterus. Mouth:  Oral mucosa pink and moist. Good dentition. No lesions. Abdomen:  +BS, soft, non-tender and non-distended. No rebound or guarding. No HSM or masses noted. Msk:  Kyphosis. No obvious abnormalities on spine, lower back, where patient had fallen. No obvious bruising.  Extremities:  Without edema. Neurologic:  Alert and  oriented x4;  grossly normal neurologically. Psych:  Alert and cooperative. Normal mood and  affect.

## 2015-03-01 NOTE — Patient Instructions (Signed)
Please have your son take you to the emergency room to be evaluated.  Do not take Viberzi right now. You may take dulcolax and Miralax. Let's see how you do without anything. If you start having diarrhea again, you can take just 1 tablet with breakfast. You may just need to just take it every other day. CALL ME IF YOU DON"T HAVE A BOWEL MOVEMENT FOR MORE THAN 3 DAYS.   I will see you in 2-4 weeks.

## 2015-03-02 DIAGNOSIS — M545 Low back pain: Secondary | ICD-10-CM | POA: Diagnosis not present

## 2015-03-02 DIAGNOSIS — R911 Solitary pulmonary nodule: Secondary | ICD-10-CM | POA: Diagnosis not present

## 2015-03-07 DIAGNOSIS — R911 Solitary pulmonary nodule: Secondary | ICD-10-CM | POA: Diagnosis not present

## 2015-03-07 DIAGNOSIS — I2510 Atherosclerotic heart disease of native coronary artery without angina pectoris: Secondary | ICD-10-CM | POA: Diagnosis not present

## 2015-03-07 DIAGNOSIS — K449 Diaphragmatic hernia without obstruction or gangrene: Secondary | ICD-10-CM | POA: Diagnosis not present

## 2015-03-07 DIAGNOSIS — J432 Centrilobular emphysema: Secondary | ICD-10-CM | POA: Diagnosis not present

## 2015-03-08 NOTE — Assessment & Plan Note (Signed)
82 year old female with chronic diarrhea, essentially normal colonoscopy with negative stool sampling, now with improvement after starting Viberzi 75 mg BID. In fact, she is more constipated and hasn't had a BM in several days. She has no concerning features. Of more concern, she fell prior to her appointment and notes considerable back discomfort. No numbness, tingling, difficulty moving extremities.   Stop Viberzi for right now. Take a dose of Miralax. If diarrhea recurs, ONLY take 1 Viberzi daily to every other day. Call if no BM in more than 2 days. I have also instructed her to go to the ED due to recent fall. Her son will meet her at her home and take her. She is in no apparent distress. Return in 2-4 weeks.

## 2015-03-09 ENCOUNTER — Telehealth: Payer: Self-pay | Admitting: Gastroenterology

## 2015-03-09 NOTE — Telephone Encounter (Signed)
I spoke with the pt, she is constipated. Ever since taking the viberzi, she will go 5-6 days without a bm. No pain, but does have some cramping before finally having a bm. Her last bm was today, she said it was a small amount but it was semi-formed. She is out of the viberzi samples but stated it was going to cost her $400 a month and she couldn't afford them anyway.  She also wanted me to let AS know that she did go to the ED and get checked out where she fell. She said to let AS know that they told her nothing was broken. They did find a lung nodule and did a CT and they told her it was nothing to worry about.

## 2015-03-09 NOTE — Progress Notes (Signed)
cc'ed to pcp °

## 2015-03-09 NOTE — Telephone Encounter (Signed)
Patient calling to give update, viberzi is working too well, is not constipated.  Taken all the samples.  [phone]

## 2015-03-10 NOTE — Telephone Encounter (Signed)
Stop the Viberzi.  Let's just have her take a probiotic daily such as Align, Restora, Philip's colon health. I need to see her back in a few weeks, which is probably already scheduled.

## 2015-03-13 NOTE — Telephone Encounter (Signed)
Pt has ov on 03/24/15

## 2015-03-13 NOTE — Telephone Encounter (Signed)
Tried to call pt- NA- LMOM with recommendations.  

## 2015-03-24 ENCOUNTER — Encounter: Payer: Self-pay | Admitting: Gastroenterology

## 2015-03-24 ENCOUNTER — Ambulatory Visit (INDEPENDENT_AMBULATORY_CARE_PROVIDER_SITE_OTHER): Payer: Medicare Other | Admitting: Gastroenterology

## 2015-03-24 VITALS — BP 141/75 | HR 56 | Temp 98.0°F | Ht 61.0 in | Wt 124.6 lb

## 2015-03-24 DIAGNOSIS — R197 Diarrhea, unspecified: Secondary | ICD-10-CM | POA: Diagnosis not present

## 2015-03-24 NOTE — Progress Notes (Signed)
Referring Provider: Richardean Chimera, MD Primary Care Physician:  Donzetta Sprung, MD  Primary GI: Dr. Jena Gauss   Chief Complaint  Patient presents with  . Follow-up    HPI:   Melissa Bentley is a 82 y.o. female presenting today with a history of chronic diarrhea, with most recent colonoscopy revealing colonic diverticulosis but negative stool sampling. GI pathogen panel completed with negative results, Cdiff PCR negative. Normal pancreatic elastase. Was given Viberzi 75 BID but this caused significant constipation. She is unable to afford this either.   Weight is fluctuating. Down about a lb from earlier this month. Dec 2016 was 2 lbs heavier than today. Outside labs with normal TSH. States when she went to the ED after a fall that they found a "nodule" on her lung. Dr. Reuel Boom has followed up on this and she states it is benign. Had a CT chest [facility]. Will request this. Taking Philip's probiotic. No further diarrhea. Stool is now "broken up pieces" but not watery. Terrible gas. Still wears a pad "just in case".   Past Medical History  Diagnosis Date  . CAD (coronary artery disease)   . GERD (gastroesophageal reflux disease)   . Osteoporosis   . Hypothyroidism   . Hyperlipidemia   . Hypertension   . Depression   . PVD (peripheral vascular disease) ([facility])   . Psoriasis   . MI (myocardial infarction) [facility])     Past Surgical History  Procedure Laterality Date  . Abdominal hysterectomy    . Lung lobectomy    . Tubal ligation    . Coronary angioplasty with stent placement    . Subclavian artery stent    . Colonoscopy N/A 02/01/2015    Dr. Rourk:colonic diverticulosis s/p biopsy with unremarkable colonic mucosa, no microscopic colitis.     Current Outpatient Prescriptions  Medication Sig Dispense Refill  . aspirin 81 MG tablet Take 81 mg by mouth daily.    Marland Kitchen atorvastatin (LIPITOR) 40 MG tablet Take 40 mg by mouth daily at 6 PM.     . cilostazol (PLETAL) 100 MG tablet Take  100 mg by mouth 2 (two) times daily.     . citalopram (CELEXA) 20 MG tablet Take 20 mg by mouth daily.     . clopidogrel (PLAVIX) 75 MG tablet Take 75 mg by mouth daily.     . Eluxadoline (VIBERZI) 75 MG TABS Take 1 tablet by mouth daily with breakfast. Do not take if more than 4 days without a bowel movement. Call our office. 60 tablet 3  . levothyroxine (SYNTHROID, LEVOTHROID) 75 MCG tablet Take 75 mcg by mouth daily before breakfast.     . lisinopril (PRINIVIL,ZESTRIL) 40 MG tablet Take 40 mg by mouth daily.     . metoprolol (LOPRESSOR) 50 MG tablet Take 50 mg by mouth 2 (two) times daily.     Marland Kitchen omeprazole (PRILOSEC) 20 MG capsule Take 20 mg by mouth daily. Reported on 01/17/2015    . pantoprazole (PROTONIX) 40 MG tablet Take 40 mg by mouth daily.    . polyethylene glycol-electrolytes (TRILYTE) 420 G solution Take 4,000 mLs by mouth as directed. (Patient not taking: Reported on 03/24/2015) 4000 mL 0   No current facility-administered medications for this visit.    Allergies as of 03/24/2015  . (No Known Allergies)    Family History  Problem Relation Age of Onset  . Colon cancer Neg Hx   . Lung cancer Sister     Social History  Social History  . Marital Status: Widowed    Spouse Name: N/A  . Number of Children: N/A  . Years of Education: N/A   Social History Main Topics  . Smoking status: Former Smoker    Quit date: 01/17/1996  . Smokeless tobacco: None  . Alcohol Use: No  . Drug Use: No  . Sexual Activity: Not Asked   Other Topics Concern  . None   Social History Narrative    Review of Systems: As mentioned in HPI   Physical Exam: BP 141/75 mmHg  Pulse 56  Temp(Src) 98 F (36.7 C) (Oral)  Ht  (1.549 m)  Wt 124 lb 9.6 oz (56.518 kg)  BMI 23.56 kg/m2 General:   Alert and oriented. No distress noted. Pleasant and cooperative.  Head:  Normocephalic and atraumatic. Eyes:  Conjuctiva clear without scleral icterus. Abdomen:  +BS, soft, non-tender and  non-distended. No rebound or guarding. No HSM or masses noted. Extremities:  Without edema. Neurologic:  Alert and  oriented x4;  grossly normal neurologically. Psych:  Alert and cooperative. Normal mood and affect.

## 2015-03-24 NOTE — Assessment & Plan Note (Signed)
82 year old female with chronic diarrhea that actually has resolved as of this visit. Previously Viberzi prescribed but this caused significant constipation. She has had a thorough work-up to include colonoscopy, stool studies, fecal elastase test. I am not convinced that there is not a component of pancreatic insufficiency in this scenario, despite "normal" fecal elastase.  She is down 1 lb since last visit but overall her weight has been staying within the same few pounds. I would like to trial Zenpep 40,000 units one with each meal. We will see her back in 6 weeks. If she has any further weight loss, I recommend a CT to rule out any occult malignancy. Will retrieve CT chest from [facility] in interm.

## 2015-03-24 NOTE — Patient Instructions (Signed)
Start taking Zenpep 1 capsule with meals three times a day.  I would like to see you in 4-6 weeks.   I have provided samples of Zenpep. Let me know how you like this.

## 2015-03-27 NOTE — Progress Notes (Signed)
CC'D TO PCP °

## 2015-04-25 DIAGNOSIS — E782 Mixed hyperlipidemia: Secondary | ICD-10-CM | POA: Diagnosis not present

## 2015-04-25 DIAGNOSIS — K219 Gastro-esophageal reflux disease without esophagitis: Secondary | ICD-10-CM | POA: Diagnosis not present

## 2015-04-25 DIAGNOSIS — I10 Essential (primary) hypertension: Secondary | ICD-10-CM | POA: Diagnosis not present

## 2015-04-25 DIAGNOSIS — Z1389 Encounter for screening for other disorder: Secondary | ICD-10-CM | POA: Diagnosis not present

## 2015-04-25 DIAGNOSIS — I739 Peripheral vascular disease, unspecified: Secondary | ICD-10-CM | POA: Diagnosis not present

## 2015-04-25 DIAGNOSIS — E039 Hypothyroidism, unspecified: Secondary | ICD-10-CM | POA: Diagnosis not present

## 2015-04-25 DIAGNOSIS — I2510 Atherosclerotic heart disease of native coronary artery without angina pectoris: Secondary | ICD-10-CM | POA: Diagnosis not present

## 2015-04-25 DIAGNOSIS — F331 Major depressive disorder, recurrent, moderate: Secondary | ICD-10-CM | POA: Diagnosis not present

## 2015-04-27 ENCOUNTER — Ambulatory Visit (INDEPENDENT_AMBULATORY_CARE_PROVIDER_SITE_OTHER): Payer: Medicare Other | Admitting: Gastroenterology

## 2015-04-27 ENCOUNTER — Encounter: Payer: Self-pay | Admitting: Gastroenterology

## 2015-04-27 VITALS — BP 124/75 | HR 55 | Temp 96.9°F | Ht 61.0 in | Wt 125.2 lb

## 2015-04-27 DIAGNOSIS — R197 Diarrhea, unspecified: Secondary | ICD-10-CM

## 2015-04-27 NOTE — Assessment & Plan Note (Signed)
Resolved. Colonoscopy up-to-date. I am quite pleased that her weight is remaining stable. At this point, she is actually trending more to constipation. Start fiber daily. Return in 3 months or sooner if needed.

## 2015-04-27 NOTE — Progress Notes (Signed)
Referring Provider: Richardean Chimeraaniel, [name], MD Primary Care Physician:  Donzetta SprungANIEL, TERRY, MD  Primary GI: Dr. Jena Gaussourk.   Chief Complaint  Patient presents with  . Follow-up    HPI:   Melissa Bentley is an 82 y.o. female presenting today with a history of chronic diarrhea, with most recent colonoscopy revealing colonic diverticulosis but negative stool sampling. GI pathogen panel completed with negative results, Cdiff PCR negative. Normal pancreatic elastase. Was given Viberzi 75 BID but this caused significant constipation. She is unable to afford this either. Weight has been fluctuating. Outside TSH normal. At last visit no diarrhea. I gave her Zenpep empirically at last visit. She states she never got this. Weight is stable. She returns today stating her diarrhea has resolved. No abdominal pain. Appetite so/so. Doing better than she has been doing. Excited about visiting her great great granddaughter in the future in [place].   Past Medical History  Diagnosis Date  . CAD (coronary artery disease)   . GERD (gastroesophageal reflux disease)   . Osteoporosis   . Hypothyroidism   . Hyperlipidemia   . Hypertension   . Depression   . PVD (peripheral vascular disease) ([facility])   . Psoriasis   . MI (myocardial infarction) [facility])     Past Surgical History  Procedure Laterality Date  . Abdominal hysterectomy    . Lung lobectomy    . Tubal ligation    . Coronary angioplasty with stent placement    . Subclavian artery stent    . Colonoscopy N/A 02/01/2015    Dr. Rourk:colonic diverticulosis s/p biopsy with unremarkable colonic mucosa, no microscopic colitis.     Current Outpatient Prescriptions  Medication Sig Dispense Refill  . aspirin 81 MG tablet Take 81 mg by mouth daily. Reported on 04/27/2015    . atorvastatin (LIPITOR) 40 MG tablet Take 40 mg by mouth daily at 6 PM.     . cilostazol (PLETAL) 100 MG tablet Take 100 mg by mouth 2 (two) times daily.     . citalopram (CELEXA) 20 MG tablet Take  20 mg by mouth daily.     . clopidogrel (PLAVIX) 75 MG tablet Take 75 mg by mouth daily.     . Eluxadoline (VIBERZI) 75 MG TABS Take 1 tablet by mouth daily with breakfast. Do not take if more than 4 days without a bowel movement. Call our office. 60 tablet 3  . levothyroxine (SYNTHROID, LEVOTHROID) 75 MCG tablet Take 75 mcg by mouth daily before breakfast.     . lisinopril (PRINIVIL,ZESTRIL) 40 MG tablet Take 40 mg by mouth daily.     . metoprolol (LOPRESSOR) 50 MG tablet Take 50 mg by mouth 2 (two) times daily.     . pantoprazole (PROTONIX) 40 MG tablet Take 40 mg by mouth daily.    Marland Kitchen. omeprazole (PRILOSEC) 20 MG capsule Take 20 mg by mouth daily. Reported on 04/27/2015    . polyethylene glycol-electrolytes (TRILYTE) 420 G solution Take 4,000 mLs by mouth as directed. (Patient not taking: Reported on 03/24/2015) 4000 mL 0   No current facility-administered medications for this visit.    Allergies as of 04/27/2015  . (No Known Allergies)    Family History  Problem Relation Age of Onset  . Colon cancer Neg Hx   . Lung cancer Sister     Social History   Social History  . Marital Status: Widowed    Spouse Name: N/A  . Number of Children: N/A  . Years of Education:  N/A   Social History Main Topics  . Smoking status: Former Smoker    Quit date: 01/17/1996  . Smokeless tobacco: None  . Alcohol Use: No  . Drug Use: No  . Sexual Activity: Not Asked   Other Topics Concern  . None   Social History Narrative    Review of Systems: Negative unless mentioned in HPI.   Physical Exam: BP 124/75 mmHg  Pulse 55  Temp(Src) 96.9 F (36.1 C)  Ht  (1.549 m)  Wt 125 lb 3.2 oz (56.79 kg)  BMI 23.67 kg/m2 General:   Alert and oriented. No distress noted. Pleasant and cooperative.  Head:  Normocephalic and atraumatic. Eyes:  Conjuctiva clear without scleral icterus Abdomen:  +BS, soft, non-tender and non-distended. No rebound or guarding. No HSM or masses noted. Msk:  Symmetrical  without gross deformities. Normal posture Extremities:  Without edema. Neurologic:  Alert and  oriented x4;  grossly normal neurologically Psych:  Alert and cooperative. Normal mood and affect.

## 2015-04-27 NOTE — Patient Instructions (Signed)
Start taking benefiber or metamucil as directed daily.  I will see you back in 3 months!

## 2015-04-28 DIAGNOSIS — Z1231 Encounter for screening mammogram for malignant neoplasm of breast: Secondary | ICD-10-CM | POA: Diagnosis not present

## 2015-04-28 NOTE — Progress Notes (Signed)
cc'ed to pcp °

## 2015-04-28 NOTE — Progress Notes (Signed)
[facility]. This notes that there is NO suspicious lung nodule. There had been a small nodule questioned on thoracic spine films previously, but this was felt to be a vessel likely.

## 2015-05-31 DIAGNOSIS — Z79899 Other long term (current) drug therapy: Secondary | ICD-10-CM | POA: Diagnosis not present

## 2015-05-31 DIAGNOSIS — I10 Essential (primary) hypertension: Secondary | ICD-10-CM | POA: Diagnosis not present

## 2015-05-31 DIAGNOSIS — Z87891 Personal history of nicotine dependence: Secondary | ICD-10-CM | POA: Diagnosis not present

## 2015-05-31 DIAGNOSIS — M810 Age-related osteoporosis without current pathological fracture: Secondary | ICD-10-CM | POA: Diagnosis not present

## 2015-05-31 DIAGNOSIS — E7800 Pure hypercholesterolemia, unspecified: Secondary | ICD-10-CM | POA: Diagnosis not present

## 2015-05-31 DIAGNOSIS — E039 Hypothyroidism, unspecified: Secondary | ICD-10-CM | POA: Diagnosis not present

## 2015-05-31 DIAGNOSIS — Z780 Asymptomatic menopausal state: Secondary | ICD-10-CM | POA: Diagnosis not present

## 2015-05-31 DIAGNOSIS — Z7982 Long term (current) use of aspirin: Secondary | ICD-10-CM | POA: Diagnosis not present

## 2015-05-31 DIAGNOSIS — K219 Gastro-esophageal reflux disease without esophagitis: Secondary | ICD-10-CM | POA: Diagnosis not present

## 2015-07-05 DIAGNOSIS — Z8249 Family history of ischemic heart disease and other diseases of the circulatory system: Secondary | ICD-10-CM | POA: Diagnosis not present

## 2015-07-05 DIAGNOSIS — Z23 Encounter for immunization: Secondary | ICD-10-CM | POA: Diagnosis not present

## 2015-07-05 DIAGNOSIS — S52591A Other fractures of lower end of right radius, initial encounter for closed fracture: Secondary | ICD-10-CM | POA: Diagnosis not present

## 2015-07-05 DIAGNOSIS — W010XXA Fall on same level from slipping, tripping and stumbling without subsequent striking against object, initial encounter: Secondary | ICD-10-CM | POA: Diagnosis not present

## 2015-07-05 DIAGNOSIS — I252 Old myocardial infarction: Secondary | ICD-10-CM | POA: Diagnosis not present

## 2015-07-05 DIAGNOSIS — E039 Hypothyroidism, unspecified: Secondary | ICD-10-CM | POA: Diagnosis not present

## 2015-07-05 DIAGNOSIS — Z7902 Long term (current) use of antithrombotics/antiplatelets: Secondary | ICD-10-CM | POA: Diagnosis not present

## 2015-07-05 DIAGNOSIS — Z79899 Other long term (current) drug therapy: Secondary | ICD-10-CM | POA: Diagnosis not present

## 2015-07-05 DIAGNOSIS — S8391XA Sprain of unspecified site of right knee, initial encounter: Secondary | ICD-10-CM | POA: Diagnosis not present

## 2015-07-05 DIAGNOSIS — I739 Peripheral vascular disease, unspecified: Secondary | ICD-10-CM | POA: Diagnosis not present

## 2015-07-05 DIAGNOSIS — I10 Essential (primary) hypertension: Secondary | ICD-10-CM | POA: Diagnosis not present

## 2015-07-05 DIAGNOSIS — I519 Heart disease, unspecified: Secondary | ICD-10-CM | POA: Diagnosis not present

## 2015-07-05 DIAGNOSIS — M25561 Pain in right knee: Secondary | ICD-10-CM | POA: Diagnosis not present

## 2015-07-05 DIAGNOSIS — S52501A Unspecified fracture of the lower end of right radius, initial encounter for closed fracture: Secondary | ICD-10-CM | POA: Diagnosis not present

## 2015-07-05 DIAGNOSIS — M25531 Pain in right wrist: Secondary | ICD-10-CM | POA: Diagnosis not present

## 2015-07-05 DIAGNOSIS — S8001XA Contusion of right knee, initial encounter: Secondary | ICD-10-CM | POA: Diagnosis not present

## 2015-07-06 DIAGNOSIS — S52591A Other fractures of lower end of right radius, initial encounter for closed fracture: Secondary | ICD-10-CM | POA: Diagnosis not present

## 2015-07-06 DIAGNOSIS — S80211A Abrasion, right knee, initial encounter: Secondary | ICD-10-CM | POA: Diagnosis not present

## 2015-07-06 DIAGNOSIS — R296 Repeated falls: Secondary | ICD-10-CM | POA: Diagnosis not present

## 2015-07-08 DIAGNOSIS — M79641 Pain in right hand: Secondary | ICD-10-CM | POA: Diagnosis not present

## 2015-07-08 DIAGNOSIS — S0512XA Contusion of eyeball and orbital tissues, left eye, initial encounter: Secondary | ICD-10-CM | POA: Diagnosis not present

## 2015-07-08 DIAGNOSIS — S29019A Strain of muscle and tendon of unspecified wall of thorax, initial encounter: Secondary | ICD-10-CM | POA: Diagnosis not present

## 2015-07-08 DIAGNOSIS — I10 Essential (primary) hypertension: Secondary | ICD-10-CM | POA: Diagnosis not present

## 2015-07-08 DIAGNOSIS — I519 Heart disease, unspecified: Secondary | ICD-10-CM | POA: Diagnosis not present

## 2015-07-08 DIAGNOSIS — E039 Hypothyroidism, unspecified: Secondary | ICD-10-CM | POA: Diagnosis not present

## 2015-07-08 DIAGNOSIS — R51 Headache: Secondary | ICD-10-CM | POA: Diagnosis not present

## 2015-07-08 DIAGNOSIS — W109XXA Fall (on) (from) unspecified stairs and steps, initial encounter: Secondary | ICD-10-CM | POA: Diagnosis not present

## 2015-07-08 DIAGNOSIS — Z955 Presence of coronary angioplasty implant and graft: Secondary | ICD-10-CM | POA: Diagnosis not present

## 2015-07-08 DIAGNOSIS — S29012A Strain of muscle and tendon of back wall of thorax, initial encounter: Secondary | ICD-10-CM | POA: Diagnosis not present

## 2015-07-08 DIAGNOSIS — S0511XA Contusion of eyeball and orbital tissues, right eye, initial encounter: Secondary | ICD-10-CM | POA: Diagnosis not present

## 2015-07-08 DIAGNOSIS — Z7902 Long term (current) use of antithrombotics/antiplatelets: Secondary | ICD-10-CM | POA: Diagnosis not present

## 2015-07-08 DIAGNOSIS — Z79899 Other long term (current) drug therapy: Secondary | ICD-10-CM | POA: Diagnosis not present

## 2015-07-08 DIAGNOSIS — M542 Cervicalgia: Secondary | ICD-10-CM | POA: Diagnosis not present

## 2015-07-08 DIAGNOSIS — S0083XA Contusion of other part of head, initial encounter: Secondary | ICD-10-CM | POA: Diagnosis not present

## 2015-07-08 DIAGNOSIS — M546 Pain in thoracic spine: Secondary | ICD-10-CM | POA: Diagnosis not present

## 2015-07-08 DIAGNOSIS — F329 Major depressive disorder, single episode, unspecified: Secondary | ICD-10-CM | POA: Diagnosis not present

## 2015-07-08 DIAGNOSIS — Z8249 Family history of ischemic heart disease and other diseases of the circulatory system: Secondary | ICD-10-CM | POA: Diagnosis not present

## 2015-07-08 DIAGNOSIS — M7989 Other specified soft tissue disorders: Secondary | ICD-10-CM | POA: Diagnosis not present

## 2015-07-08 DIAGNOSIS — E7800 Pure hypercholesterolemia, unspecified: Secondary | ICD-10-CM | POA: Diagnosis not present

## 2015-07-08 DIAGNOSIS — I739 Peripheral vascular disease, unspecified: Secondary | ICD-10-CM | POA: Diagnosis not present

## 2015-07-08 DIAGNOSIS — M25531 Pain in right wrist: Secondary | ICD-10-CM | POA: Diagnosis not present

## 2015-07-08 DIAGNOSIS — I252 Old myocardial infarction: Secondary | ICD-10-CM | POA: Diagnosis not present

## 2015-07-08 DIAGNOSIS — M79631 Pain in right forearm: Secondary | ICD-10-CM | POA: Diagnosis not present

## 2015-07-11 DIAGNOSIS — M25531 Pain in right wrist: Secondary | ICD-10-CM | POA: Diagnosis not present

## 2015-07-11 DIAGNOSIS — S52551A Other extraarticular fracture of lower end of right radius, initial encounter for closed fracture: Secondary | ICD-10-CM | POA: Diagnosis not present

## 2015-07-25 DIAGNOSIS — S52551A Other extraarticular fracture of lower end of right radius, initial encounter for closed fracture: Secondary | ICD-10-CM | POA: Diagnosis not present

## 2015-07-28 ENCOUNTER — Ambulatory Visit: Payer: Medicare Other | Admitting: Gastroenterology

## 2015-08-22 DIAGNOSIS — S52551A Other extraarticular fracture of lower end of right radius, initial encounter for closed fracture: Secondary | ICD-10-CM | POA: Diagnosis not present

## 2015-09-07 ENCOUNTER — Encounter: Payer: Self-pay | Admitting: Gastroenterology

## 2015-09-07 ENCOUNTER — Ambulatory Visit (INDEPENDENT_AMBULATORY_CARE_PROVIDER_SITE_OTHER): Payer: Medicare Other | Admitting: Gastroenterology

## 2015-09-07 VITALS — BP 133/70 | HR 53 | Temp 97.0°F | Ht 61.0 in | Wt 118.2 lb

## 2015-09-07 DIAGNOSIS — K529 Noninfective gastroenteritis and colitis, unspecified: Secondary | ICD-10-CM

## 2015-09-07 NOTE — Patient Instructions (Signed)
Stop Imodium for now.  Start taking Zenpep 1 capsule with meals. I have provided samples. Let me know how this works. If it does, we will send to the pharmacy.  I will see you in 6-8 weeks!

## 2015-09-07 NOTE — Progress Notes (Signed)
Referring Provider: Richardean Bentley, Melissa G, MD Primary Care Physician:  Donzetta SprungANIEL, TERRY, MD  Primary GI: Dr. Jena Gaussourk   Chief Complaint  Patient presents with  . Follow-up    diarrhea at times    HPI:   Melissa Bentley is a 82 y.o. female presenting today with a history of chronic diarrhea, with most recent colonoscopy noting colonic diverticulosis but negative stool sampling. GI pathogen panel completed and negative. Cdiff PCR negative. Normal pancreatic elastase. Viberzi with significant constipation but also unable to afford. Outside TSH normal. At last visit in March, diarrhea had resolved. She has lost 7 lbs since March.   Fallen twice since last seen. Will have diarrhea, takes Imodium, then not go for several days then have diarrhea again. No abdominal pain. No N/V. Good appetite.   Past Medical History:  Diagnosis Date  . CAD (coronary artery disease)   . Depression   . GERD (gastroesophageal reflux disease)   . Hyperlipidemia   . Hypertension   . Hypothyroidism   . MI (myocardial infarction) (HCC)   . Osteoporosis   . Psoriasis   . PVD (peripheral vascular disease) (HCC)     Past Surgical History:  Procedure Laterality Date  . ABDOMINAL HYSTERECTOMY    . COLONOSCOPY N/A 02/01/2015   Dr. Rourk:colonic diverticulosis s/p biopsy with unremarkable colonic mucosa, no microscopic colitis.   . CORONARY ANGIOPLASTY WITH STENT PLACEMENT    . LUNG LOBECTOMY    . SUBCLAVIAN ARTERY STENT    . TUBAL LIGATION      Current Outpatient Prescriptions  Medication Sig Dispense Refill  . aspirin 81 MG tablet Take 81 mg by mouth daily. Reported on 04/27/2015    . atorvastatin (LIPITOR) 40 MG tablet Take 40 mg by mouth daily at 6 PM.     . cilostazol (PLETAL) 100 MG tablet Take 100 mg by mouth 2 (two) times daily.     . citalopram (CELEXA) 20 MG tablet Take 20 mg by mouth daily.     . clopidogrel (PLAVIX) 75 MG tablet Take 75 mg by mouth daily.     Marland Kitchen. levothyroxine (SYNTHROID, LEVOTHROID) 75  MCG tablet Take 75 mcg by mouth daily before breakfast.     . lisinopril (PRINIVIL,ZESTRIL) 40 MG tablet Take 40 mg by mouth daily.     . metoprolol (LOPRESSOR) 50 MG tablet Take 50 mg by mouth 2 (two) times daily.     . pantoprazole (PROTONIX) 40 MG tablet Take 40 mg by mouth daily.     No current facility-administered medications for this visit.     Allergies as of 09/07/2015  . (No Known Allergies)    Family History  Problem Relation Age of Onset  . Colon cancer Neg Hx   . Lung cancer Sister     Social History   Social History  . Marital status: Widowed    Spouse name: N/A  . Number of children: N/A  . Years of education: N/A   Social History Main Topics  . Smoking status: Former Smoker    Quit date: 01/17/1996  . Smokeless tobacco: None  . Alcohol use No  . Drug use: No  . Sexual activity: Not Asked   Other Topics Concern  . None   Social History Narrative  . None    Review of Systems: As mentioned in HPI   Physical Exam: BP 133/70   Pulse (!) 53   Temp 97 F (36.1 C) (Oral)   Ht 5\' 1"  (1.549 m)  Wt 118 lb 3.2 oz (53.6 kg)   BMI 22.33 kg/m  General:   Alert and oriented. No distress noted. Pleasant and cooperative.  Head:  Normocephalic and atraumatic. Eyes:  Conjuctiva clear without scleral icterus. Abdomen:  +BS, soft, non-tender and non-distended. No rebound or guarding. No HSM or masses noted. Extremities:  Without edema. Neurologic:  Alert and  oriented x4;  grossly normal neurologically. Psych:  Alert and cooperative. Normal mood and affect.

## 2015-09-10 NOTE — Assessment & Plan Note (Addendum)
82 year old female with chronic, intermittent diarrhea. Weight loss noted since last visit, although this is in the setting of 2 falls. Colonoscopy on file, negative stool studies, TSH normal recently. Trial of Zenpep 40,000 units 1 capsule with meals three times a day. Hold Imodium for now. Return for close follow-up in 6 weeks.

## 2015-09-11 NOTE — Progress Notes (Signed)
cc'ed to pcp °

## 2015-09-19 DIAGNOSIS — I10 Essential (primary) hypertension: Secondary | ICD-10-CM | POA: Diagnosis not present

## 2015-09-19 DIAGNOSIS — E039 Hypothyroidism, unspecified: Secondary | ICD-10-CM | POA: Diagnosis not present

## 2015-09-19 DIAGNOSIS — K219 Gastro-esophageal reflux disease without esophagitis: Secondary | ICD-10-CM | POA: Diagnosis not present

## 2015-09-19 DIAGNOSIS — E782 Mixed hyperlipidemia: Secondary | ICD-10-CM | POA: Diagnosis not present

## 2015-09-26 DIAGNOSIS — E039 Hypothyroidism, unspecified: Secondary | ICD-10-CM | POA: Diagnosis not present

## 2015-09-26 DIAGNOSIS — Z0001 Encounter for general adult medical examination with abnormal findings: Secondary | ICD-10-CM | POA: Diagnosis not present

## 2015-09-26 DIAGNOSIS — Z6821 Body mass index (BMI) 21.0-21.9, adult: Secondary | ICD-10-CM | POA: Diagnosis not present

## 2015-09-26 DIAGNOSIS — I2510 Atherosclerotic heart disease of native coronary artery without angina pectoris: Secondary | ICD-10-CM | POA: Diagnosis not present

## 2015-09-26 DIAGNOSIS — I10 Essential (primary) hypertension: Secondary | ICD-10-CM | POA: Diagnosis not present

## 2015-09-26 DIAGNOSIS — F331 Major depressive disorder, recurrent, moderate: Secondary | ICD-10-CM | POA: Diagnosis not present

## 2015-09-26 DIAGNOSIS — E782 Mixed hyperlipidemia: Secondary | ICD-10-CM | POA: Diagnosis not present

## 2015-09-26 DIAGNOSIS — I739 Peripheral vascular disease, unspecified: Secondary | ICD-10-CM | POA: Diagnosis not present

## 2015-09-29 ENCOUNTER — Other Ambulatory Visit: Payer: Self-pay

## 2015-10-24 ENCOUNTER — Ambulatory Visit (INDEPENDENT_AMBULATORY_CARE_PROVIDER_SITE_OTHER): Payer: Medicare Other | Admitting: Gastroenterology

## 2015-10-24 ENCOUNTER — Encounter: Payer: Self-pay | Admitting: Gastroenterology

## 2015-10-24 VITALS — BP 116/63 | HR 52 | Temp 97.9°F | Ht 61.0 in | Wt 117.8 lb

## 2015-10-24 DIAGNOSIS — K529 Noninfective gastroenteritis and colitis, unspecified: Secondary | ICD-10-CM

## 2015-10-24 NOTE — Patient Instructions (Signed)
We will see you back in January 2018.   I am glad you are doing well, and please let me know if you need anything!

## 2015-10-24 NOTE — Progress Notes (Signed)
Referring Provider: Richardean Chimeraaniel, [name], MD Primary Care Physician:  Donzetta SprungERRY DANIEL, MD  Primary GI: Dr. Jena Gaussourk   Chief Complaint  Patient presents with  . Follow-up    HPI:   Melissa Bentley is a 82 y.o. female presenting today with a history of chronic diarrhea, with most recent colonoscopy noting colonic diverticulosis but negative stool sampling. GI pathogen panel completed and negative. Cdiff PCR negative. Normal pancreatic elastase. Viberzi with significant constipation but also unable to afford. Outside TSH normal. At last visit in March, diarrhea had resolved. At last visit in Aug, she had lost 7 lbs. She was given trial of Zenpep. Returns today for close follow-up.   Zenpep helped with diarrhea but didn't want to take as prescribed because it constipated her. Wants to just go back to Imodium. Will wait until she has a loose stool, then take a small Imodium, but then she knows she won't have a BM for a few days. If she doesn't take something, she is concerned about urgency. She would rather take the imodium.   Past Medical History:  Diagnosis Date  . CAD (coronary artery disease)   . Depression   . GERD (gastroesophageal reflux disease)   . Hyperlipidemia   . Hypertension   . Hypothyroidism   . MI (myocardial infarction) (HCC)   . Osteoporosis   . Psoriasis   . PVD (peripheral vascular disease) (HCC)     Past Surgical History:  Procedure Laterality Date  . ABDOMINAL HYSTERECTOMY    . COLONOSCOPY N/A 02/01/2015   Dr. Rourk:colonic diverticulosis s/p biopsy with unremarkable colonic mucosa, no microscopic colitis.   . CORONARY ANGIOPLASTY WITH STENT PLACEMENT    . LUNG LOBECTOMY    . SUBCLAVIAN ARTERY STENT    . TUBAL LIGATION      Current Outpatient Prescriptions  Medication Sig Dispense Refill  . aspirin 81 MG tablet Take 81 mg by mouth daily. Reported on 04/27/2015    . atorvastatin (LIPITOR) 40 MG tablet Take 40 mg by mouth daily at 6 PM.     . cilostazol (PLETAL)  100 MG tablet Take 100 mg by mouth 2 (two) times daily.     . citalopram (CELEXA) 20 MG tablet Take 20 mg by mouth daily.     . clopidogrel (PLAVIX) 75 MG tablet Take 75 mg by mouth daily.     Marland Kitchen. levothyroxine (SYNTHROID, LEVOTHROID) 75 MCG tablet Take 75 mcg by mouth daily before breakfast.     . lisinopril (PRINIVIL,ZESTRIL) 40 MG tablet Take 40 mg by mouth daily.     . metoprolol (LOPRESSOR) 50 MG tablet Take 50 mg by mouth 2 (two) times daily.     . pantoprazole (PROTONIX) 40 MG tablet Take 40 mg by mouth daily.     No current facility-administered medications for this visit.     Allergies as of 10/24/2015  . (No Known Allergies)    Family History  Problem Relation Age of Onset  . Colon cancer Neg Hx   . Lung cancer Sister     Social History   Social History  . Marital status: Widowed    Spouse name: N/A  . Number of children: N/A  . Years of education: N/A   Social History Main Topics  . Smoking status: Former Smoker    Quit date: 01/17/1996  . Smokeless tobacco: None  . Alcohol use No  . Drug use: No  . Sexual activity: Not Asked   Other Topics Concern  .  None   Social History Narrative  . None    Review of Systems: Negative unless mentioned in HPI   Physical Exam: BP 116/63   Pulse (!) 52   Temp 97.9 F (36.6 C) (Oral)   Ht 5\' 1"  (1.549 m)   Wt 117 lb 12.8 oz (53.4 kg)   BMI 22.26 kg/m  General:   Alert and oriented. No distress noted. Pleasant and cooperative.  Head:  Normocephalic and atraumatic. Eyes:  Conjuctiva clear without scleral icterus. Abdomen:  +BS, soft, non-tender and non-distended. No rebound or guarding. No HSM or masses noted. Msk:  Symmetrical without gross deformities. Normal posture. Extremities:  Without edema. Neurologic:  Alert and  oriented x4 Psych:  Alert and cooperative. Normal mood and affect.

## 2015-10-25 NOTE — Progress Notes (Signed)
CC'ED TO PCP 

## 2015-10-25 NOTE — Assessment & Plan Note (Signed)
Chronic intermittent diarrhea, with Zenpep causing constipation. Colonoscopy on file, stool studies normal, TSH normal. Imodium seems to be the best agent, taken sparingly prn. Continue close follow-up. Return in Jan.

## 2015-12-01 DIAGNOSIS — Z23 Encounter for immunization: Secondary | ICD-10-CM | POA: Diagnosis not present

## 2016-01-26 DIAGNOSIS — I10 Essential (primary) hypertension: Secondary | ICD-10-CM | POA: Diagnosis not present

## 2016-01-26 DIAGNOSIS — E782 Mixed hyperlipidemia: Secondary | ICD-10-CM | POA: Diagnosis not present

## 2016-01-26 DIAGNOSIS — D692 Other nonthrombocytopenic purpura: Secondary | ICD-10-CM | POA: Diagnosis not present

## 2016-01-26 DIAGNOSIS — K210 Gastro-esophageal reflux disease with esophagitis: Secondary | ICD-10-CM | POA: Diagnosis not present

## 2016-01-31 DIAGNOSIS — Z6822 Body mass index (BMI) 22.0-22.9, adult: Secondary | ICD-10-CM | POA: Diagnosis not present

## 2016-01-31 DIAGNOSIS — I10 Essential (primary) hypertension: Secondary | ICD-10-CM | POA: Diagnosis not present

## 2016-01-31 DIAGNOSIS — I739 Peripheral vascular disease, unspecified: Secondary | ICD-10-CM | POA: Diagnosis not present

## 2016-01-31 DIAGNOSIS — K219 Gastro-esophageal reflux disease without esophagitis: Secondary | ICD-10-CM | POA: Diagnosis not present

## 2016-01-31 DIAGNOSIS — E782 Mixed hyperlipidemia: Secondary | ICD-10-CM | POA: Diagnosis not present

## 2016-01-31 DIAGNOSIS — I2510 Atherosclerotic heart disease of native coronary artery without angina pectoris: Secondary | ICD-10-CM | POA: Diagnosis not present

## 2016-01-31 DIAGNOSIS — E039 Hypothyroidism, unspecified: Secondary | ICD-10-CM | POA: Diagnosis not present

## 2016-01-31 DIAGNOSIS — F331 Major depressive disorder, recurrent, moderate: Secondary | ICD-10-CM | POA: Diagnosis not present

## 2016-02-26 ENCOUNTER — Ambulatory Visit: Payer: Medicare Other | Admitting: Gastroenterology

## 2016-05-27 DIAGNOSIS — E039 Hypothyroidism, unspecified: Secondary | ICD-10-CM | POA: Diagnosis not present

## 2016-05-27 DIAGNOSIS — I10 Essential (primary) hypertension: Secondary | ICD-10-CM | POA: Diagnosis not present

## 2016-05-27 DIAGNOSIS — K210 Gastro-esophageal reflux disease with esophagitis: Secondary | ICD-10-CM | POA: Diagnosis not present

## 2016-05-27 DIAGNOSIS — E782 Mixed hyperlipidemia: Secondary | ICD-10-CM | POA: Diagnosis not present

## 2016-05-29 DIAGNOSIS — E039 Hypothyroidism, unspecified: Secondary | ICD-10-CM | POA: Diagnosis not present

## 2016-05-29 DIAGNOSIS — I2510 Atherosclerotic heart disease of native coronary artery without angina pectoris: Secondary | ICD-10-CM | POA: Diagnosis not present

## 2016-05-29 DIAGNOSIS — I739 Peripheral vascular disease, unspecified: Secondary | ICD-10-CM | POA: Diagnosis not present

## 2016-05-29 DIAGNOSIS — Z1389 Encounter for screening for other disorder: Secondary | ICD-10-CM | POA: Diagnosis not present

## 2016-05-29 DIAGNOSIS — Z6823 Body mass index (BMI) 23.0-23.9, adult: Secondary | ICD-10-CM | POA: Diagnosis not present

## 2016-05-29 DIAGNOSIS — F331 Major depressive disorder, recurrent, moderate: Secondary | ICD-10-CM | POA: Diagnosis not present

## 2016-05-29 DIAGNOSIS — E782 Mixed hyperlipidemia: Secondary | ICD-10-CM | POA: Diagnosis not present

## 2016-05-29 DIAGNOSIS — I10 Essential (primary) hypertension: Secondary | ICD-10-CM | POA: Diagnosis not present

## 2016-06-10 DIAGNOSIS — S8002XA Contusion of left knee, initial encounter: Secondary | ICD-10-CM | POA: Diagnosis not present

## 2016-06-10 DIAGNOSIS — Z6823 Body mass index (BMI) 23.0-23.9, adult: Secondary | ICD-10-CM | POA: Diagnosis not present

## 2016-06-10 DIAGNOSIS — S51802A Unspecified open wound of left forearm, initial encounter: Secondary | ICD-10-CM | POA: Diagnosis not present

## 2016-06-25 DIAGNOSIS — M1000 Idiopathic gout, unspecified site: Secondary | ICD-10-CM | POA: Diagnosis not present

## 2016-06-25 DIAGNOSIS — Z6822 Body mass index (BMI) 22.0-22.9, adult: Secondary | ICD-10-CM | POA: Diagnosis not present

## 2016-06-26 DIAGNOSIS — M1000 Idiopathic gout, unspecified site: Secondary | ICD-10-CM | POA: Diagnosis not present

## 2016-07-10 DIAGNOSIS — Z6821 Body mass index (BMI) 21.0-21.9, adult: Secondary | ICD-10-CM | POA: Diagnosis not present

## 2016-07-10 DIAGNOSIS — R0602 Shortness of breath: Secondary | ICD-10-CM | POA: Diagnosis not present

## 2016-07-10 DIAGNOSIS — R55 Syncope and collapse: Secondary | ICD-10-CM | POA: Diagnosis not present

## 2016-07-15 DIAGNOSIS — I517 Cardiomegaly: Secondary | ICD-10-CM | POA: Diagnosis not present

## 2016-07-15 DIAGNOSIS — R55 Syncope and collapse: Secondary | ICD-10-CM | POA: Diagnosis not present

## 2016-07-15 DIAGNOSIS — I340 Nonrheumatic mitral (valve) insufficiency: Secondary | ICD-10-CM | POA: Diagnosis not present

## 2016-10-02 DIAGNOSIS — E782 Mixed hyperlipidemia: Secondary | ICD-10-CM | POA: Diagnosis not present

## 2016-10-02 DIAGNOSIS — E039 Hypothyroidism, unspecified: Secondary | ICD-10-CM | POA: Diagnosis not present

## 2016-10-02 DIAGNOSIS — Z9189 Other specified personal risk factors, not elsewhere classified: Secondary | ICD-10-CM | POA: Diagnosis not present

## 2016-10-02 DIAGNOSIS — K210 Gastro-esophageal reflux disease with esophagitis: Secondary | ICD-10-CM | POA: Diagnosis not present

## 2016-10-02 DIAGNOSIS — I10 Essential (primary) hypertension: Secondary | ICD-10-CM | POA: Diagnosis not present

## 2016-10-07 DIAGNOSIS — Z0001 Encounter for general adult medical examination with abnormal findings: Secondary | ICD-10-CM | POA: Diagnosis not present

## 2016-10-07 DIAGNOSIS — Z1212 Encounter for screening for malignant neoplasm of rectum: Secondary | ICD-10-CM | POA: Diagnosis not present

## 2016-10-07 DIAGNOSIS — Z23 Encounter for immunization: Secondary | ICD-10-CM | POA: Diagnosis not present

## 2016-10-07 DIAGNOSIS — I2510 Atherosclerotic heart disease of native coronary artery without angina pectoris: Secondary | ICD-10-CM | POA: Diagnosis not present

## 2016-10-07 DIAGNOSIS — I739 Peripheral vascular disease, unspecified: Secondary | ICD-10-CM | POA: Diagnosis not present

## 2016-10-07 DIAGNOSIS — E039 Hypothyroidism, unspecified: Secondary | ICD-10-CM | POA: Diagnosis not present

## 2016-10-07 DIAGNOSIS — R911 Solitary pulmonary nodule: Secondary | ICD-10-CM | POA: Diagnosis not present

## 2016-10-07 DIAGNOSIS — I10 Essential (primary) hypertension: Secondary | ICD-10-CM | POA: Diagnosis not present

## 2016-10-28 DIAGNOSIS — S3993XA Unspecified injury of pelvis, initial encounter: Secondary | ICD-10-CM | POA: Diagnosis not present

## 2016-10-28 DIAGNOSIS — I252 Old myocardial infarction: Secondary | ICD-10-CM | POA: Diagnosis not present

## 2016-10-28 DIAGNOSIS — S7001XA Contusion of right hip, initial encounter: Secondary | ICD-10-CM | POA: Diagnosis not present

## 2016-10-28 DIAGNOSIS — S32120A Nondisplaced Zone II fracture of sacrum, initial encounter for closed fracture: Secondary | ICD-10-CM | POA: Diagnosis not present

## 2016-10-28 DIAGNOSIS — W1839XA Other fall on same level, initial encounter: Secondary | ICD-10-CM | POA: Diagnosis not present

## 2016-10-28 DIAGNOSIS — F329 Major depressive disorder, single episode, unspecified: Secondary | ICD-10-CM | POA: Diagnosis not present

## 2016-10-28 DIAGNOSIS — Z87891 Personal history of nicotine dependence: Secondary | ICD-10-CM | POA: Diagnosis not present

## 2016-10-28 DIAGNOSIS — Z902 Acquired absence of lung [part of]: Secondary | ICD-10-CM | POA: Diagnosis not present

## 2016-10-28 DIAGNOSIS — M25551 Pain in right hip: Secondary | ICD-10-CM | POA: Diagnosis not present

## 2016-10-28 DIAGNOSIS — Z79899 Other long term (current) drug therapy: Secondary | ICD-10-CM | POA: Diagnosis not present

## 2016-10-28 DIAGNOSIS — E7800 Pure hypercholesterolemia, unspecified: Secondary | ICD-10-CM | POA: Diagnosis not present

## 2016-10-28 DIAGNOSIS — I10 Essential (primary) hypertension: Secondary | ICD-10-CM | POA: Diagnosis not present

## 2016-10-28 DIAGNOSIS — E039 Hypothyroidism, unspecified: Secondary | ICD-10-CM | POA: Diagnosis not present

## 2016-10-28 DIAGNOSIS — S79911A Unspecified injury of right hip, initial encounter: Secondary | ICD-10-CM | POA: Diagnosis not present

## 2016-10-31 DIAGNOSIS — W102XXA Fall (on)(from) incline, initial encounter: Secondary | ICD-10-CM | POA: Diagnosis not present

## 2016-10-31 DIAGNOSIS — S32129A Unspecified Zone II fracture of sacrum, initial encounter for closed fracture: Secondary | ICD-10-CM | POA: Diagnosis not present

## 2016-10-31 DIAGNOSIS — Z6821 Body mass index (BMI) 21.0-21.9, adult: Secondary | ICD-10-CM | POA: Diagnosis not present

## 2016-10-31 DIAGNOSIS — M25551 Pain in right hip: Secondary | ICD-10-CM | POA: Diagnosis not present

## 2016-11-13 DIAGNOSIS — M84454A Pathological fracture, pelvis, initial encounter for fracture: Secondary | ICD-10-CM | POA: Diagnosis not present

## 2016-11-13 DIAGNOSIS — R296 Repeated falls: Secondary | ICD-10-CM | POA: Diagnosis not present

## 2016-11-13 DIAGNOSIS — Z6821 Body mass index (BMI) 21.0-21.9, adult: Secondary | ICD-10-CM | POA: Diagnosis not present

## 2017-02-03 DIAGNOSIS — I10 Essential (primary) hypertension: Secondary | ICD-10-CM | POA: Diagnosis not present

## 2017-02-03 DIAGNOSIS — E039 Hypothyroidism, unspecified: Secondary | ICD-10-CM | POA: Diagnosis not present

## 2017-02-03 DIAGNOSIS — Z9189 Other specified personal risk factors, not elsewhere classified: Secondary | ICD-10-CM | POA: Diagnosis not present

## 2017-02-03 DIAGNOSIS — K210 Gastro-esophageal reflux disease with esophagitis: Secondary | ICD-10-CM | POA: Diagnosis not present

## 2017-02-03 DIAGNOSIS — E782 Mixed hyperlipidemia: Secondary | ICD-10-CM | POA: Diagnosis not present

## 2017-02-06 DIAGNOSIS — I739 Peripheral vascular disease, unspecified: Secondary | ICD-10-CM | POA: Diagnosis not present

## 2017-02-06 DIAGNOSIS — Z6821 Body mass index (BMI) 21.0-21.9, adult: Secondary | ICD-10-CM | POA: Diagnosis not present

## 2017-02-06 DIAGNOSIS — E039 Hypothyroidism, unspecified: Secondary | ICD-10-CM | POA: Diagnosis not present

## 2017-02-06 DIAGNOSIS — I2510 Atherosclerotic heart disease of native coronary artery without angina pectoris: Secondary | ICD-10-CM | POA: Diagnosis not present

## 2017-02-06 DIAGNOSIS — L400 Psoriasis vulgaris: Secondary | ICD-10-CM | POA: Diagnosis not present

## 2017-02-06 DIAGNOSIS — K219 Gastro-esophageal reflux disease without esophagitis: Secondary | ICD-10-CM | POA: Diagnosis not present

## 2017-02-06 DIAGNOSIS — E782 Mixed hyperlipidemia: Secondary | ICD-10-CM | POA: Diagnosis not present

## 2017-02-06 DIAGNOSIS — I10 Essential (primary) hypertension: Secondary | ICD-10-CM | POA: Diagnosis not present

## 2017-06-06 DIAGNOSIS — E782 Mixed hyperlipidemia: Secondary | ICD-10-CM | POA: Diagnosis not present

## 2017-06-06 DIAGNOSIS — Z1331 Encounter for screening for depression: Secondary | ICD-10-CM | POA: Diagnosis not present

## 2017-06-06 DIAGNOSIS — Z1389 Encounter for screening for other disorder: Secondary | ICD-10-CM | POA: Diagnosis not present

## 2017-06-06 DIAGNOSIS — I10 Essential (primary) hypertension: Secondary | ICD-10-CM | POA: Diagnosis not present

## 2017-06-06 DIAGNOSIS — Z681 Body mass index (BMI) 19 or less, adult: Secondary | ICD-10-CM | POA: Diagnosis not present

## 2017-06-06 DIAGNOSIS — I739 Peripheral vascular disease, unspecified: Secondary | ICD-10-CM | POA: Diagnosis not present

## 2017-06-06 DIAGNOSIS — M545 Low back pain: Secondary | ICD-10-CM | POA: Diagnosis not present

## 2017-06-06 DIAGNOSIS — E039 Hypothyroidism, unspecified: Secondary | ICD-10-CM | POA: Diagnosis not present

## 2017-07-25 DIAGNOSIS — I10 Essential (primary) hypertension: Secondary | ICD-10-CM | POA: Diagnosis not present

## 2017-07-25 DIAGNOSIS — E782 Mixed hyperlipidemia: Secondary | ICD-10-CM | POA: Diagnosis not present

## 2017-07-25 DIAGNOSIS — E039 Hypothyroidism, unspecified: Secondary | ICD-10-CM | POA: Diagnosis not present

## 2017-09-24 DIAGNOSIS — Z6820 Body mass index (BMI) 20.0-20.9, adult: Secondary | ICD-10-CM | POA: Diagnosis not present

## 2017-09-24 DIAGNOSIS — R0602 Shortness of breath: Secondary | ICD-10-CM | POA: Diagnosis not present

## 2017-09-24 DIAGNOSIS — R55 Syncope and collapse: Secondary | ICD-10-CM | POA: Diagnosis not present

## 2017-09-24 DIAGNOSIS — R296 Repeated falls: Secondary | ICD-10-CM | POA: Diagnosis not present

## 2017-10-01 DIAGNOSIS — E039 Hypothyroidism, unspecified: Secondary | ICD-10-CM | POA: Diagnosis not present

## 2017-10-01 DIAGNOSIS — R202 Paresthesia of skin: Secondary | ICD-10-CM | POA: Diagnosis not present

## 2017-10-01 DIAGNOSIS — E7800 Pure hypercholesterolemia, unspecified: Secondary | ICD-10-CM | POA: Diagnosis not present

## 2017-10-01 DIAGNOSIS — I252 Old myocardial infarction: Secondary | ICD-10-CM | POA: Diagnosis not present

## 2017-10-01 DIAGNOSIS — S065X0A Traumatic subdural hemorrhage without loss of consciousness, initial encounter: Secondary | ICD-10-CM | POA: Diagnosis not present

## 2017-10-01 DIAGNOSIS — Z87891 Personal history of nicotine dependence: Secondary | ICD-10-CM | POA: Diagnosis not present

## 2017-10-01 DIAGNOSIS — R51 Headache: Secondary | ICD-10-CM | POA: Diagnosis not present

## 2017-10-01 DIAGNOSIS — I10 Essential (primary) hypertension: Secondary | ICD-10-CM | POA: Diagnosis not present

## 2017-10-01 DIAGNOSIS — I6200 Nontraumatic subdural hemorrhage, unspecified: Secondary | ICD-10-CM | POA: Diagnosis not present

## 2017-10-01 DIAGNOSIS — W1839XA Other fall on same level, initial encounter: Secondary | ICD-10-CM | POA: Diagnosis not present

## 2017-10-01 DIAGNOSIS — R55 Syncope and collapse: Secondary | ICD-10-CM | POA: Diagnosis not present

## 2017-10-01 DIAGNOSIS — I739 Peripheral vascular disease, unspecified: Secondary | ICD-10-CM | POA: Diagnosis not present

## 2017-10-01 DIAGNOSIS — F329 Major depressive disorder, single episode, unspecified: Secondary | ICD-10-CM | POA: Diagnosis not present

## 2017-10-01 DIAGNOSIS — Z79899 Other long term (current) drug therapy: Secondary | ICD-10-CM | POA: Diagnosis not present

## 2017-10-01 DIAGNOSIS — W19XXXA Unspecified fall, initial encounter: Secondary | ICD-10-CM | POA: Diagnosis not present

## 2017-10-01 DIAGNOSIS — Z7902 Long term (current) use of antithrombotics/antiplatelets: Secondary | ICD-10-CM | POA: Diagnosis not present

## 2017-10-02 DIAGNOSIS — I6203 Nontraumatic chronic subdural hemorrhage: Secondary | ICD-10-CM | POA: Diagnosis not present

## 2017-10-03 ENCOUNTER — Inpatient Hospital Stay (HOSPITAL_COMMUNITY): Payer: Medicare Other | Admitting: Certified Registered"

## 2017-10-03 ENCOUNTER — Inpatient Hospital Stay (HOSPITAL_COMMUNITY)
Admission: AD | Admit: 2017-10-03 | Discharge: 2017-10-10 | DRG: 026 | Disposition: A | Discharge status: Skilled Nursing Facility | Payer: Medicare Other | Source: Other Acute Inpatient Hospital | Attending: Neurosurgery | Admitting: Neurosurgery

## 2017-10-03 ENCOUNTER — Inpatient Hospital Stay (HOSPITAL_COMMUNITY)
Admission: AD | Disposition: A | Discharge status: Skilled Nursing Facility | Payer: Self-pay | Source: Other Acute Inpatient Hospital | Attending: Neurosurgery

## 2017-10-03 ENCOUNTER — Other Ambulatory Visit: Payer: Self-pay

## 2017-10-03 ENCOUNTER — Encounter (HOSPITAL_COMMUNITY): Payer: Self-pay | Admitting: Certified Registered"

## 2017-10-03 DIAGNOSIS — S065X0A Traumatic subdural hemorrhage without loss of consciousness, initial encounter: Principal | ICD-10-CM | POA: Diagnosis present

## 2017-10-03 DIAGNOSIS — R2689 Other abnormalities of gait and mobility: Secondary | ICD-10-CM | POA: Diagnosis not present

## 2017-10-03 DIAGNOSIS — I10 Essential (primary) hypertension: Secondary | ICD-10-CM | POA: Diagnosis present

## 2017-10-03 DIAGNOSIS — M5030 Other cervical disc degeneration, unspecified cervical region: Secondary | ICD-10-CM | POA: Diagnosis not present

## 2017-10-03 DIAGNOSIS — Z48811 Encounter for surgical aftercare following surgery on the nervous system: Secondary | ICD-10-CM | POA: Diagnosis not present

## 2017-10-03 DIAGNOSIS — Z743 Need for continuous supervision: Secondary | ICD-10-CM | POA: Diagnosis not present

## 2017-10-03 DIAGNOSIS — I252 Old myocardial infarction: Secondary | ICD-10-CM | POA: Diagnosis not present

## 2017-10-03 DIAGNOSIS — W19XXXA Unspecified fall, initial encounter: Secondary | ICD-10-CM | POA: Diagnosis present

## 2017-10-03 DIAGNOSIS — L409 Psoriasis, unspecified: Secondary | ICD-10-CM | POA: Diagnosis present

## 2017-10-03 DIAGNOSIS — Z7982 Long term (current) use of aspirin: Secondary | ICD-10-CM

## 2017-10-03 DIAGNOSIS — E039 Hypothyroidism, unspecified: Secondary | ICD-10-CM | POA: Diagnosis not present

## 2017-10-03 DIAGNOSIS — Z79890 Hormone replacement therapy (postmenopausal): Secondary | ICD-10-CM | POA: Diagnosis not present

## 2017-10-03 DIAGNOSIS — Z79899 Other long term (current) drug therapy: Secondary | ICD-10-CM

## 2017-10-03 DIAGNOSIS — M6281 Muscle weakness (generalized): Secondary | ICD-10-CM | POA: Diagnosis not present

## 2017-10-03 DIAGNOSIS — Y838 Other surgical procedures as the cause of abnormal reaction of the patient, or of later complication, without mention of misadventure at the time of the procedure: Secondary | ICD-10-CM | POA: Diagnosis not present

## 2017-10-03 DIAGNOSIS — I739 Peripheral vascular disease, unspecified: Secondary | ICD-10-CM | POA: Diagnosis not present

## 2017-10-03 DIAGNOSIS — R4182 Altered mental status, unspecified: Secondary | ICD-10-CM | POA: Diagnosis not present

## 2017-10-03 DIAGNOSIS — Z87891 Personal history of nicotine dependence: Secondary | ICD-10-CM

## 2017-10-03 DIAGNOSIS — I2510 Atherosclerotic heart disease of native coronary artery without angina pectoris: Secondary | ICD-10-CM | POA: Diagnosis not present

## 2017-10-03 DIAGNOSIS — K219 Gastro-esophageal reflux disease without esophagitis: Secondary | ICD-10-CM | POA: Diagnosis not present

## 2017-10-03 DIAGNOSIS — W1830XA Fall on same level, unspecified, initial encounter: Secondary | ICD-10-CM | POA: Diagnosis not present

## 2017-10-03 DIAGNOSIS — Z7902 Long term (current) use of antithrombotics/antiplatelets: Secondary | ICD-10-CM

## 2017-10-03 DIAGNOSIS — L8990 Pressure ulcer of unspecified site, unspecified stage: Secondary | ICD-10-CM | POA: Diagnosis present

## 2017-10-03 DIAGNOSIS — I6203 Nontraumatic chronic subdural hemorrhage: Secondary | ICD-10-CM | POA: Diagnosis present

## 2017-10-03 DIAGNOSIS — R296 Repeated falls: Secondary | ICD-10-CM | POA: Diagnosis not present

## 2017-10-03 DIAGNOSIS — R41841 Cognitive communication deficit: Secondary | ICD-10-CM | POA: Diagnosis not present

## 2017-10-03 DIAGNOSIS — G9761 Postprocedural hematoma of a nervous system organ or structure following a nervous system procedure: Secondary | ICD-10-CM | POA: Diagnosis not present

## 2017-10-03 DIAGNOSIS — E7800 Pure hypercholesterolemia, unspecified: Secondary | ICD-10-CM | POA: Diagnosis not present

## 2017-10-03 DIAGNOSIS — E785 Hyperlipidemia, unspecified: Secondary | ICD-10-CM | POA: Diagnosis present

## 2017-10-03 DIAGNOSIS — F329 Major depressive disorder, single episode, unspecified: Secondary | ICD-10-CM | POA: Diagnosis present

## 2017-10-03 DIAGNOSIS — Z90710 Acquired absence of both cervix and uterus: Secondary | ICD-10-CM | POA: Diagnosis not present

## 2017-10-03 DIAGNOSIS — M810 Age-related osteoporosis without current pathological fracture: Secondary | ICD-10-CM | POA: Diagnosis not present

## 2017-10-03 DIAGNOSIS — Z955 Presence of coronary angioplasty implant and graft: Secondary | ICD-10-CM

## 2017-10-03 DIAGNOSIS — R52 Pain, unspecified: Secondary | ICD-10-CM | POA: Diagnosis not present

## 2017-10-03 DIAGNOSIS — E041 Nontoxic single thyroid nodule: Secondary | ICD-10-CM | POA: Diagnosis not present

## 2017-10-03 DIAGNOSIS — S0993XA Unspecified injury of face, initial encounter: Secondary | ICD-10-CM | POA: Diagnosis not present

## 2017-10-03 DIAGNOSIS — Z9181 History of falling: Secondary | ICD-10-CM | POA: Diagnosis not present

## 2017-10-03 DIAGNOSIS — I6200 Nontraumatic subdural hemorrhage, unspecified: Secondary | ICD-10-CM | POA: Diagnosis not present

## 2017-10-03 DIAGNOSIS — R279 Unspecified lack of coordination: Secondary | ICD-10-CM | POA: Diagnosis not present

## 2017-10-03 DIAGNOSIS — S065X9D Traumatic subdural hemorrhage with loss of consciousness of unspecified duration, subsequent encounter: Secondary | ICD-10-CM | POA: Diagnosis not present

## 2017-10-03 DIAGNOSIS — I119 Hypertensive heart disease without heart failure: Secondary | ICD-10-CM | POA: Diagnosis not present

## 2017-10-03 DIAGNOSIS — M5489 Other dorsalgia: Secondary | ICD-10-CM | POA: Diagnosis not present

## 2017-10-03 DIAGNOSIS — S199XXA Unspecified injury of neck, initial encounter: Secondary | ICD-10-CM | POA: Diagnosis not present

## 2017-10-03 HISTORY — PX: CRANIOTOMY: SHX93

## 2017-10-03 LAB — ABO/RH: ABO/RH(D): O NEG

## 2017-10-03 LAB — MRSA PCR SCREENING: MRSA by PCR: NEGATIVE

## 2017-10-03 SURGERY — CRANIOTOMY HEMATOMA EVACUATION SUBDURAL
Anesthesia: General | Laterality: Right

## 2017-10-03 MED ORDER — THROMBIN 20000 UNITS EX SOLR
CUTANEOUS | Status: DC | PRN
  Administered 2017-10-03: 15:00:00 via TOPICAL

## 2017-10-03 MED ORDER — FENTANYL CITRATE (PF) 100 MCG/2ML IJ SOLN: 25.0000 ug | INTRAMUSCULAR | Status: DC | PRN

## 2017-10-03 MED ORDER — LACTATED RINGERS IV SOLN
INTRAVENOUS | Status: DC | PRN
  Administered 2017-10-03: 16:00:00 via INTRAVENOUS

## 2017-10-03 MED ORDER — LEVETIRACETAM IN NACL 500 MG/100ML IV SOLN
500.0000 mg | Freq: Two times a day (BID) | INTRAVENOUS | Status: DC
  Administered 2017-10-03 – 2017-10-08 (×11): 500 mg via INTRAVENOUS
  Filled 2017-10-03 (×11): qty 100

## 2017-10-03 MED ORDER — ONDANSETRON HCL 4 MG/2ML IJ SOLN: 4.0000 mg | INTRAMUSCULAR | Status: DC | PRN

## 2017-10-03 MED ORDER — METOPROLOL TARTRATE 50 MG PO TABS
50.0000 mg | ORAL_TABLET | Freq: Two times a day (BID) | ORAL | Status: DC
  Administered 2017-10-03 – 2017-10-10 (×12): 50 mg via ORAL
  Filled 2017-10-03 (×12): qty 1

## 2017-10-03 MED ORDER — DEXAMETHASONE SODIUM PHOSPHATE 10 MG/ML IJ SOLN
INTRAMUSCULAR | Status: DC | PRN
  Administered 2017-10-03: 10 mg via INTRAVENOUS

## 2017-10-03 MED ORDER — LIDOCAINE-EPINEPHRINE 0.5 %-1:200000 IJ SOLN
INTRAMUSCULAR | Status: AC
  Filled 2017-10-03: qty 1

## 2017-10-03 MED ORDER — ONDANSETRON HCL 4 MG/2ML IJ SOLN
INTRAMUSCULAR | Status: DC | PRN
  Administered 2017-10-03: 4 mg via INTRAVENOUS

## 2017-10-03 MED ORDER — HYDROCODONE-ACETAMINOPHEN 5-325 MG PO TABS
1.0000 | ORAL_TABLET | ORAL | Status: DC | PRN
  Administered 2017-10-04 – 2017-10-09 (×5): 1 via ORAL
  Filled 2017-10-03 (×5): qty 1

## 2017-10-03 MED ORDER — LISINOPRIL 20 MG PO TABS
40.0000 mg | ORAL_TABLET | Freq: Every day | ORAL | Status: DC
  Administered 2017-10-05 – 2017-10-10 (×5): 40 mg via ORAL
  Filled 2017-10-03 (×5): qty 2

## 2017-10-03 MED ORDER — LIDOCAINE 2% (20 MG/ML) 5 ML SYRINGE
INTRAMUSCULAR | Status: DC | PRN
  Administered 2017-10-03: 100 mg via INTRAVENOUS

## 2017-10-03 MED ORDER — LIDOCAINE-EPINEPHRINE 0.5 %-1:200000 IJ SOLN
INTRAMUSCULAR | Status: DC | PRN
  Administered 2017-10-03: 6 mL

## 2017-10-03 MED ORDER — ONDANSETRON HCL 4 MG/2ML IJ SOLN
INTRAMUSCULAR | Status: AC
  Filled 2017-10-03: qty 2

## 2017-10-03 MED ORDER — PROMETHAZINE HCL 25 MG PO TABS: 12.5000 mg | ORAL_TABLET | ORAL | Status: DC | PRN

## 2017-10-03 MED ORDER — PROPOFOL 10 MG/ML IV BOLUS
INTRAVENOUS | Status: DC | PRN
  Administered 2017-10-03: 100 mg via INTRAVENOUS

## 2017-10-03 MED ORDER — METOCLOPRAMIDE HCL 5 MG/ML IJ SOLN: 10.0000 mg | Freq: Once | INTRAMUSCULAR | Status: DC | PRN

## 2017-10-03 MED ORDER — FENTANYL CITRATE (PF) 250 MCG/5ML IJ SOLN
INTRAMUSCULAR | Status: AC
  Filled 2017-10-03: qty 5

## 2017-10-03 MED ORDER — DEXAMETHASONE SODIUM PHOSPHATE 10 MG/ML IJ SOLN
INTRAMUSCULAR | Status: AC
  Filled 2017-10-03: qty 1

## 2017-10-03 MED ORDER — CEFAZOLIN SODIUM-DEXTROSE 2-3 GM-%(50ML) IV SOLR
INTRAVENOUS | Status: DC | PRN
  Administered 2017-10-03: 2 g via INTRAVENOUS

## 2017-10-03 MED ORDER — ATORVASTATIN CALCIUM 40 MG PO TABS
40.0000 mg | ORAL_TABLET | Freq: Every day | ORAL | Status: DC
  Administered 2017-10-04 – 2017-10-09 (×5): 40 mg via ORAL
  Filled 2017-10-03 (×5): qty 1

## 2017-10-03 MED ORDER — POTASSIUM CHLORIDE IN NACL 20-0.9 MEQ/L-% IV SOLN
INTRAVENOUS | Status: DC
  Administered 2017-10-03 – 2017-10-08 (×10): via INTRAVENOUS
  Filled 2017-10-03 (×10): qty 1000

## 2017-10-03 MED ORDER — SUGAMMADEX SODIUM 200 MG/2ML IV SOLN
INTRAVENOUS | Status: DC | PRN
  Administered 2017-10-03: 200 mg via INTRAVENOUS

## 2017-10-03 MED ORDER — MAGNESIUM CITRATE PO SOLN: 1.0000 | Freq: Once | ORAL | Status: DC | PRN

## 2017-10-03 MED ORDER — SODIUM CHLORIDE 0.9 % IV SOLN
INTRAVENOUS | Status: DC | PRN
  Administered 2017-10-03: 16:00:00 via INTRAVENOUS

## 2017-10-03 MED ORDER — CILOSTAZOL 50 MG PO TABS
100.0000 mg | ORAL_TABLET | Freq: Two times a day (BID) | ORAL | Status: DC
  Administered 2017-10-03 – 2017-10-10 (×13): 100 mg via ORAL
  Filled 2017-10-03 (×4): qty 1
  Filled 2017-10-03 (×2): qty 2
  Filled 2017-10-03 (×3): qty 1
  Filled 2017-10-03 (×2): qty 2
  Filled 2017-10-03 (×3): qty 1
  Filled 2017-10-03: qty 2
  Filled 2017-10-03: qty 1

## 2017-10-03 MED ORDER — ROCURONIUM BROMIDE 10 MG/ML (PF) SYRINGE
PREFILLED_SYRINGE | INTRAVENOUS | Status: DC | PRN
  Administered 2017-10-03: 50 mg via INTRAVENOUS

## 2017-10-03 MED ORDER — FAMOTIDINE IN NACL 20-0.9 MG/50ML-% IV SOLN
20.0000 mg | Freq: Two times a day (BID) | INTRAVENOUS | Status: DC
  Administered 2017-10-03 – 2017-10-07 (×9): 20 mg via INTRAVENOUS
  Filled 2017-10-03 (×9): qty 50

## 2017-10-03 MED ORDER — PANTOPRAZOLE SODIUM 40 MG PO TBEC
40.0000 mg | DELAYED_RELEASE_TABLET | Freq: Every day | ORAL | Status: DC
  Administered 2017-10-04 – 2017-10-10 (×6): 40 mg via ORAL
  Filled 2017-10-03 (×6): qty 1

## 2017-10-03 MED ORDER — SODIUM CHLORIDE 0.9 % IV SOLN
INTRAVENOUS | Status: DC | PRN
  Administered 2017-10-03: 19:00:00 via INTRAVENOUS

## 2017-10-03 MED ORDER — MORPHINE SULFATE (PF) 2 MG/ML IV SOLN
1.0000 mg | INTRAVENOUS | Status: DC | PRN
  Administered 2017-10-04 – 2017-10-06 (×2): 2 mg via INTRAVENOUS
  Filled 2017-10-03 (×2): qty 1

## 2017-10-03 MED ORDER — EPHEDRINE SULFATE-NACL 50-0.9 MG/10ML-% IV SOSY
PREFILLED_SYRINGE | INTRAVENOUS | Status: DC | PRN
  Administered 2017-10-03 (×4): 10 mg via INTRAVENOUS

## 2017-10-03 MED ORDER — DOCUSATE SODIUM 100 MG PO CAPS
100.0000 mg | ORAL_CAPSULE | Freq: Two times a day (BID) | ORAL | Status: DC
  Administered 2017-10-03 – 2017-10-10 (×12): 100 mg via ORAL
  Filled 2017-10-03 (×13): qty 1

## 2017-10-03 MED ORDER — LEVOTHYROXINE SODIUM 75 MCG PO TABS
75.0000 ug | ORAL_TABLET | Freq: Every day | ORAL | Status: DC
  Administered 2017-10-04 – 2017-10-10 (×6): 75 ug via ORAL
  Filled 2017-10-03 (×6): qty 1

## 2017-10-03 MED ORDER — THROMBIN 20000 UNITS EX KIT
PACK | CUTANEOUS | Status: AC
  Filled 2017-10-03: qty 1

## 2017-10-03 MED ORDER — 0.9 % SODIUM CHLORIDE (POUR BTL) OPTIME
TOPICAL | Status: DC | PRN
  Administered 2017-10-03 (×3): 1000 mL

## 2017-10-03 MED ORDER — NALOXONE HCL 0.4 MG/ML IJ SOLN: 0.0800 mg | INTRAMUSCULAR | Status: DC | PRN

## 2017-10-03 MED ORDER — CEFAZOLIN SODIUM 1 G IJ SOLR
INTRAMUSCULAR | Status: AC
  Filled 2017-10-03: qty 20

## 2017-10-03 MED ORDER — MEPERIDINE HCL 50 MG/ML IJ SOLN: 6.2500 mg | INTRAMUSCULAR | Status: DC | PRN

## 2017-10-03 MED ORDER — ASPIRIN EC 81 MG PO TBEC
81.0000 mg | DELAYED_RELEASE_TABLET | Freq: Every day | ORAL | Status: DC
  Administered 2017-10-04 – 2017-10-10 (×6): 81 mg via ORAL
  Filled 2017-10-03 (×6): qty 1

## 2017-10-03 MED ORDER — LABETALOL HCL 5 MG/ML IV SOLN
10.0000 mg | INTRAVENOUS | Status: DC | PRN
  Administered 2017-10-05: 20 mg via INTRAVENOUS
  Administered 2017-10-06: 10 mg via INTRAVENOUS
  Administered 2017-10-08 (×2): 20 mg via INTRAVENOUS
  Administered 2017-10-08: 40 mg via INTRAVENOUS
  Filled 2017-10-03 (×2): qty 4
  Filled 2017-10-03: qty 8
  Filled 2017-10-03 (×2): qty 4

## 2017-10-03 MED ORDER — FENTANYL CITRATE (PF) 250 MCG/5ML IJ SOLN
INTRAMUSCULAR | Status: DC | PRN
  Administered 2017-10-03: 50 ug via INTRAVENOUS
  Administered 2017-10-03: 25 ug via INTRAVENOUS

## 2017-10-03 MED ORDER — ONDANSETRON HCL 4 MG PO TABS: 4.0000 mg | ORAL_TABLET | ORAL | Status: DC | PRN

## 2017-10-03 MED ORDER — CITALOPRAM HYDROBROMIDE 10 MG PO TABS
20.0000 mg | ORAL_TABLET | Freq: Every day | ORAL | Status: DC
  Administered 2017-10-04 – 2017-10-10 (×6): 20 mg via ORAL
  Filled 2017-10-03 (×6): qty 2

## 2017-10-03 MED ORDER — BISACODYL 5 MG PO TBEC: 5.0000 mg | DELAYED_RELEASE_TABLET | Freq: Every day | ORAL | Status: DC | PRN

## 2017-10-03 SURGICAL SUPPLY — 63 items
BENZOIN TINCTURE PRP APPL 2/3 (GAUZE/BANDAGES/DRESSINGS) IMPLANT
BLADE CLIPPER SURG (BLADE) ×2 IMPLANT
BLADE ULTRA TIP 2M (BLADE) ×2 IMPLANT
BNDG GAUZE ELAST 4 BULKY (GAUZE/BANDAGES/DRESSINGS) IMPLANT
BUR ACORN 6.0 PRECISION (BURR) ×2 IMPLANT
BUR MATCHSTICK NEURO 3.0 LAGG (BURR) IMPLANT
BUR SPIRAL ROUTER 2.3 (BUR) IMPLANT
CANISTER SUCT 3000ML PPV (MISCELLANEOUS) ×2 IMPLANT
CARTRIDGE OIL MAESTRO DRILL (MISCELLANEOUS) ×1 IMPLANT
CLIP VESOCCLUDE MED 6/CT (CLIP) IMPLANT
DIFFUSER DRILL AIR PNEUMATIC (MISCELLANEOUS) ×2 IMPLANT
DRAIN JACKSON PRATT 10MM FLAT (MISCELLANEOUS) IMPLANT
DRAPE NEUROLOGICAL W/INCISE (DRAPES) ×2 IMPLANT
DRAPE SURG 17X23 STRL (DRAPES) IMPLANT
DRAPE WARM FLUID 44X44 (DRAPE) ×2 IMPLANT
DRSG TELFA 3X8 NADH (GAUZE/BANDAGES/DRESSINGS) ×2 IMPLANT
DURAPREP 6ML APPLICATOR 50/CS (WOUND CARE) ×2 IMPLANT
ELECT REM PT RETURN 9FT ADLT (ELECTROSURGICAL) ×2
ELECTRODE REM PT RTRN 9FT ADLT (ELECTROSURGICAL) ×1 IMPLANT
EVACUATOR 1/8 PVC DRAIN (DRAIN) IMPLANT
EVACUATOR SILICONE 100CC (DRAIN) ×2 IMPLANT
GAUZE 4X4 16PLY RFD (DISPOSABLE) IMPLANT
GAUZE SPONGE 4X4 12PLY STRL (GAUZE/BANDAGES/DRESSINGS) ×2 IMPLANT
GLOVE BIOGEL PI IND STRL 8 (GLOVE) ×1 IMPLANT
GLOVE BIOGEL PI INDICATOR 8 (GLOVE) ×1
GLOVE ECLIPSE 6.5 STRL STRAW (GLOVE) ×2 IMPLANT
GLOVE ECLIPSE 8.0 STRL XLNG CF (GLOVE) ×2 IMPLANT
GLOVE EXAM NITRILE LRG STRL (GLOVE) IMPLANT
GLOVE EXAM NITRILE XL STR (GLOVE) IMPLANT
GLOVE EXAM NITRILE XS STR PU (GLOVE) IMPLANT
GOWN STRL REUS W/ TWL LRG LVL3 (GOWN DISPOSABLE) ×2 IMPLANT
GOWN STRL REUS W/ TWL XL LVL3 (GOWN DISPOSABLE) IMPLANT
GOWN STRL REUS W/TWL 2XL LVL3 (GOWN DISPOSABLE) ×2 IMPLANT
GOWN STRL REUS W/TWL LRG LVL3 (GOWN DISPOSABLE) ×4
GOWN STRL REUS W/TWL XL LVL3 (GOWN DISPOSABLE)
HEMOSTAT SURGICEL 2X14 (HEMOSTASIS) IMPLANT
KIT BASIN OR (CUSTOM PROCEDURE TRAY) ×2 IMPLANT
KIT TURNOVER KIT B (KITS) ×2 IMPLANT
NEEDLE HYPO 25X1 1.5 SAFETY (NEEDLE) ×2 IMPLANT
NS IRRIG 1000ML POUR BTL (IV SOLUTION) ×6 IMPLANT
OIL CARTRIDGE MAESTRO DRILL (MISCELLANEOUS) ×2
PACK CRANIOTOMY CUSTOM (CUSTOM PROCEDURE TRAY) ×2 IMPLANT
PATTIES SURGICAL .5 X.5 (GAUZE/BANDAGES/DRESSINGS) IMPLANT
PATTIES SURGICAL .5 X3 (DISPOSABLE) IMPLANT
PATTIES SURGICAL 1X1 (DISPOSABLE) IMPLANT
PLATE 1.5/0.5 18.5MM BURR HOLE (Plate) ×4 IMPLANT
SCREW SELF DRILL HT 1.5/4MM (Screw) ×20 IMPLANT
SPONGE NEURO XRAY DETECT 1X3 (DISPOSABLE) IMPLANT
SPONGE SURGIFOAM ABS GEL 100 (HEMOSTASIS) ×2 IMPLANT
STAPLER VISISTAT 35W (STAPLE) ×2 IMPLANT
SUT ETHILON 3 0 FSL (SUTURE) IMPLANT
SUT ETHILON 3 0 PS 1 (SUTURE) IMPLANT
SUT NURALON 4 0 TR CR/8 (SUTURE) ×4 IMPLANT
SUT STEEL 0 (SUTURE)
SUT STEEL 0 18XMFL TIE 17 (SUTURE) IMPLANT
SUT VIC AB 2-0 CT2 18 VCP726D (SUTURE) ×4 IMPLANT
TAPE CLOTH SURG 4X10 WHT LF (GAUZE/BANDAGES/DRESSINGS) ×2 IMPLANT
TOWEL GREEN STERILE (TOWEL DISPOSABLE) ×2 IMPLANT
TOWEL GREEN STERILE FF (TOWEL DISPOSABLE) ×2 IMPLANT
TRAY FOLEY MTR SLVR 16FR STAT (SET/KITS/TRAYS/PACK) ×2 IMPLANT
TUBE CONNECTING 12X1/4 (SUCTIONS) ×2 IMPLANT
UNDERPAD 30X30 (UNDERPADS AND DIAPERS) ×2 IMPLANT
WATER STERILE IRR 1000ML POUR (IV SOLUTION) ×2 IMPLANT

## 2017-10-03 NOTE — Progress Notes (Signed)
Patient arrived via [facility] to surgical short stay.  VS obtained, no personal belongings with patient, 20g IV in R AC already in placed, 18g placed in R FA, T&S obtained, labwork reviewed from [facility] with anesthesia, CHG bath provided, multiple bruises all over body, R/L hips, face, bilateral arms, L hematoma over eye.  Dr. Franky Macho speaking with family currently.

## 2017-10-03 NOTE — Anesthesia Preprocedure Evaluation (Signed)
Anesthesia Evaluation  Patient identified by MRN, date of birth, ID band Patient awake    Reviewed: Allergy & Precautions, NPO status , Patient's Chart, lab work & pertinent test results  Airway Mallampati: II  TM Distance: >3 FB Neck ROM: Full    Dental no notable dental hx. (+) Edentulous Upper, Edentulous Lower   Pulmonary former smoker,    Pulmonary exam normal breath sounds clear to auscultation       Cardiovascular hypertension, Pt. on medications and Pt. on home beta blockers + CAD, + Past MI and + Cardiac Stents  Normal cardiovascular exam Rhythm:Regular Rate:Normal     Neuro/Psych negative neurological ROS  negative psych ROS   GI/Hepatic negative GI ROS, Neg liver ROS,   Endo/Other  negative endocrine ROS  Renal/GU negative Renal ROS  negative genitourinary   Musculoskeletal negative musculoskeletal ROS (+)   Abdominal   Peds negative pediatric ROS (+)  Hematology negative hematology ROS (+)   Anesthesia Other Findings   Reproductive/Obstetrics negative OB ROS                             Anesthesia Physical Anesthesia Plan  ASA: III and emergent  Anesthesia Plan: General   Post-op Pain Management:    Induction: Intravenous  PONV Risk Score and Plan: 3 and Ondansetron and Treatment may vary due to age or medical condition  Airway Management Planned: Oral ETT  Additional Equipment:   Intra-op Plan:   Post-operative Plan: Extubation in OR and Possible Post-op intubation/ventilation  Informed Consent: I have reviewed the patients History and Physical, chart, labs and discussed the procedure including the risks, benefits and alternatives for the proposed anesthesia with the patient or authorized representative who has indicated his/her understanding and acceptance.   Dental advisory given  Plan Discussed with: CRNA  Anesthesia Plan Comments:          Anesthesia Quick Evaluation

## 2017-10-03 NOTE — Op Note (Signed)
10/03/2017  6:11 PM  PATIENT:  Melissa Bentley  84 y.o. female  PRE-OPERATIVE DIAGNOSIS: right chronic  Subdural Hematoma  POST-OPERATIVE DIAGNOSIS: right chronic subdural hematoma  PROCEDURE:  Procedure(s): right parietal CRANIOTOMY HEMATOMA EVACUATION SUBDURAL  SURGEON: Surgeon(s): Coletta Memos, MD Julio Sicks, MD  ASSISTANTS:Pool, Sherilyn Cooter  ANESTHESIA:   general  EBL:  Total I/O In: 1200 [I.V.:1200] Out: 750 [Urine:450; Blood:300]  BLOOD ADMINISTERED:none  COUNT:per nursing  DRAINS: none   SPECIMEN:  No Specimen  DICTATION: Melissa Bentley was taken to the operating room, intubated, and placed under a general anesthetic without difficulty. She was positioned supine with her head on a horseshoe headrest. I shaved and prepped her head in a sterile manner. A foley catheter was also placed under sterile conditions. I made a coronal incision in the parietal region and placed Raney clips on the scalp edges. I exposed the skull with a cerebellar retractor. I used the drill and created two burr holes, then finished the craniotomy with the router burr. I removed the flap and opened the dura. Dark reddish brown liquid shot out of the dural opening under pressure. I irrigated copiously in the subdural space to fully evacuate the hematoma. The brain while pulsatile did not immediately fill the subdural space. I approximated the dura, and had placed a drain which clotted immediately. I removed the drain, and closed.  I approximated the dura with Dr. Lindalou Hose assistance. We approximated the galea with vicryl sutures, the scalp edges were approximated with staples. I applied a sterile dressing. She was extubated and taken to the PACU  PLAN OF CARE: Admit to inpatient   PATIENT DISPOSITION:  PACU - hemodynamically stable.   Delay start of Pharmacological VTE agent (>24hrs) due to surgical blood loss or risk of bleeding:  yes

## 2017-10-03 NOTE — H&P (Signed)
Pulse (!) 57   Temp 98 F (36.7 C) (Oral)   Resp 18   Ht 5\' 1"  (1.549 m)   Wt 49 kg   SpO2 99%   BMI 20.41 kg/m   Melissa Bentley is a 84 y.o. female Whom I saw yesterday in the office to evaluate a chronic subdural hematoma which was identified two days ago on a head CT. She was in the [place] ED after the CT was performed. She had complained of headaches, and had taken three falls in the last three weeks. The hematoma was chronic and isodense with brain. Despite its size, there was minimal shift 5mm right to left, and basal cisterns were widely patent. Thus she and her son decided that we could monitor the clot and repeat her scan in one month.  She however, fell twice since seeing me in the office yesterday, and has expanded the clot, with some fresh blood. I was called by the ED at [facility] and she is transferred to undergo operative hematoma evacuation.  No Known Allergies Past Medical History:  Diagnosis Date  . CAD (coronary artery disease)   . Depression   . GERD (gastroesophageal reflux disease)   . Hyperlipidemia   . Hypertension   . Hypothyroidism   . MI (myocardial infarction) (HCC)   . Osteoporosis   . Psoriasis   . PVD (peripheral vascular disease) (HCC)    Past Surgical History:  Procedure Laterality Date  . ABDOMINAL HYSTERECTOMY    . COLONOSCOPY N/A 02/01/2015   Dr. Rourk:colonic diverticulosis s/p biopsy with unremarkable colonic mucosa, no microscopic colitis.   . CORONARY ANGIOPLASTY WITH STENT PLACEMENT    . LUNG LOBECTOMY    . SUBCLAVIAN ARTERY STENT    . TUBAL LIGATION     Family History  Problem Relation Age of Onset  . Lung cancer Sister   . Colon cancer Neg Hx    Social History   Socioeconomic History  . Marital status: Widowed    Spouse name: Not on file  . Number of children: Not on file  . Years of education: Not on file  . Highest education level: Not on file  Occupational History  . Not on file  Social Needs  . Financial resource  strain: Not on file  . Food insecurity:    Worry: Not on file    Inability: Not on file  . Transportation needs:    Medical: Not on file    Non-medical: Not on file  Tobacco Use  . Smoking status: Former Smoker    Last attempt to quit: 01/17/1996    Years since quitting: 21.7  . Smokeless tobacco: Never Used  Substance and Sexual Activity  . Alcohol use: No    Alcohol/week: 0.0 standard drinks  . Drug use: No  . Sexual activity: Not on file  Lifestyle  . Physical activity:    Days per week: Not on file    Minutes per session: Not on file  . Stress: Not on file  Relationships  . Social connections:    Talks on phone: Not on file    Gets together: Not on file    Attends religious service: Not on file    Active member of club or organization: Not on file    Attends meetings of clubs or organizations: Not on file    Relationship status: Not on file  . Intimate partner violence:    Fear of current or ex partner: Not on file  Emotionally abused: Not on file    Physically abused: Not on file    Forced sexual activity: Not on file  Other Topics Concern  . Not on file  Social History Narrative  . Not on file   Prior to Admission medications   Medication Sig Start Date End Date Taking? Authorizing Provider  aspirin 81 MG tablet Take 81 mg by mouth daily. Reported on 04/27/2015    [provider]  atorvastatin (LIPITOR) 40 MG tablet Take 40 mg by mouth daily at 6 PM.  01/05/15   [provider]  cilostazol (PLETAL) 100 MG tablet Take 100 mg by mouth 2 (two) times daily.  01/05/15   [provider]  citalopram (CELEXA) 20 MG tablet Take 20 mg by mouth daily.  01/05/15   [provider]  clopidogrel (PLAVIX) 75 MG tablet Take 75 mg by mouth daily.  01/05/15   [provider]  levothyroxine (SYNTHROID, LEVOTHROID) 75 MCG tablet Take 75 mcg by mouth daily before breakfast.  01/05/15   [provider]  lisinopril (PRINIVIL,ZESTRIL) 40  MG tablet Take 40 mg by mouth daily.  01/05/15   [provider]  metoprolol (LOPRESSOR) 50 MG tablet Take 50 mg by mouth 2 (two) times daily.  01/05/15   [provider]  pantoprazole (PROTONIX) 40 MG tablet Take 40 mg by mouth daily.    [provider]   Physical Exam  Constitutional: She is oriented to person, place, and time. She appears well-developed and well-nourished. She appears distressed.  HENT:  Head: Head is with raccoon's eyes, with Battle's sign and with right periorbital erythema.  Neurological: She is alert and oriented to person, place, and time. She has normal strength. No cranial nerve deficit. Gait normal. She displays no Babinski's sign on the right side. She displays no Babinski's sign on the left side.  Reflex Scores:      Tricep reflexes are 2+ on the right side and 2+ on the left side.      Bicep reflexes are 2+ on the right side and 2+ on the left side.      Brachioradialis reflexes are 2+ on the right side and 2+ on the left side.      Patellar reflexes are 2+ on the right side and 2+ on the left side.      Achilles reflexes are 2+ on the right side and 2+ on the left side. Coordination is poor in the lower extremities Gait slightly unsteady  No results found. Assessment/plan OR for craniotomy for subdural hematoma evacuation Risks and benefits were discussed at length with her family, all parties wish to move ahead with the surgery Risks including stroke, coma, death, brain damage, hematoma recurrence, need for further surgery and other risks were discussed.

## 2017-10-03 NOTE — Anesthesia Procedure Notes (Signed)
Procedure Name: Intubation Date/Time: 10/03/2017 4:02 PM Performed by: Myna Bright, CRNA Pre-anesthesia Checklist: Patient identified, Emergency Drugs available, Suction available and Patient being monitored Patient Re-evaluated:Patient Re-evaluated prior to induction Oxygen Delivery Method: Circle system utilized Preoxygenation: Pre-oxygenation with 100% oxygen Induction Type: IV induction Ventilation: Mask ventilation without difficulty and Oral airway inserted - appropriate to patient size Laryngoscope Size: Mac and 3 Grade View: Grade I Tube type: Oral Tube size: 7.0 mm Number of attempts: 1 Airway Equipment and Method: Stylet Placement Confirmation: ETT inserted through vocal cords under direct vision,  positive ETCO2 and breath sounds checked- equal and bilateral Secured at: 19 cm Tube secured with: Tape Dental Injury: Teeth and Oropharynx as per pre-operative assessment

## 2017-10-03 NOTE — Transfer of Care (Signed)
Immediate Anesthesia Transfer of Care Note  Patient: Melissa Bentley  Procedure(s) Performed: CRANIOTOMY HEMATOMA EVACUATION SUBDURAL (Right )  Patient Location: PACU  Anesthesia Type:General  Level of Consciousness: awake, alert , oriented and patient cooperative  Airway & Oxygen Therapy: Patient Spontanous Breathing and Patient connected to nasal cannula oxygen  Post-op Assessment: Report given to RN, Post -op Vital signs reviewed and stable and Patient moving all extremities  Post vital signs: Reviewed and stable  Last Vitals:  Vitals Value Taken Time  BP 157/71 10/03/2017  6:42 PM  Temp    Pulse 79 10/03/2017  6:43 PM  Resp 15 10/03/2017  6:43 PM  SpO2 97 % 10/03/2017  6:43 PM  Vitals shown include unvalidated device data.  Last Pain:  Vitals:   10/03/17 1750  TempSrc:   PainSc: 0-No pain      Patients Stated Pain Goal: 3 (10/03/17 1451)  Complications: No apparent anesthesia complications

## 2017-10-04 MED ORDER — BOOST / RESOURCE BREEZE PO LIQD CUSTOM
1.0000 | Freq: Three times a day (TID) | ORAL | Status: DC
  Administered 2017-10-04 – 2017-10-09 (×11): 1 via ORAL

## 2017-10-04 NOTE — Progress Notes (Signed)
Patient ID: Melissa Bentley, female   DOB: February 28, 1927, 84 y.o.   MRN: [id number] BP 133/61   Pulse 60   Temp 98.3 F (36.8 C) (Axillary)   Resp 18   Ht 5\' 1"  (1.549 m)   Wt 49 kg   SpO2 96%   BMI 20.41 kg/m  Lethargic, just given morphine. Has been confused Wound is dry, blood stain on dressing Stable.  Bruises on face are evolving normally

## 2017-10-04 NOTE — Anesthesia Postprocedure Evaluation (Signed)
Anesthesia Post Note  Patient: Stefan Church Laubach  Procedure(s) Performed: CRANIOTOMY HEMATOMA EVACUATION SUBDURAL (Right )     Patient location during evaluation: PACU Anesthesia Type: General Level of consciousness: awake and alert Pain management: pain level controlled Vital Signs Assessment: post-procedure vital signs reviewed and stable Respiratory status: spontaneous breathing, nonlabored ventilation, respiratory function stable and patient connected to nasal cannula oxygen Cardiovascular status: blood pressure returned to baseline and stable Postop Assessment: no apparent nausea or vomiting Anesthetic complications: no    Last Vitals:  Vitals:   10/04/17 0700 10/04/17 0800  BP: 133/61 (!) 111/52  Pulse: 60 (!) 58  Resp: 18 14  Temp:    SpO2: 96% 96%    Last Pain:  Vitals:   10/04/17 0354  TempSrc: Axillary  PainSc:                  Beryle Lathe

## 2017-10-04 NOTE — Progress Notes (Signed)
Initial Nutrition Assessment  DOCUMENTATION CODES:   Not applicable  INTERVENTION:    Boost Breeze po TID, each supplement provides 250 kcal and 9 grams of protein  NUTRITION DIAGNOSIS:   Increased nutrient needs related to acute illness, wound healing as evidenced by estimated needs.  GOAL:   Patient will meet greater than or equal to 90% of their needs  MONITOR:   Diet advancement, PO intake, Supplement acceptance  REASON FOR ASSESSMENT:   Malnutrition Screening Tool    ASSESSMENT:   84 yo female with PMH of CAD, GERD, osteoporosis, HLD, HTN, PVD, and MI who was admitted on 9/6 for chronic subdural hematoma. S/P evacuation of SDH on admission.   Patient sound asleep during RD visit. She did not awaken during NFPE. Clear liquid meal tray at bedside, untouched.   Labs and medications reviewed.    NUTRITION - FOCUSED PHYSICAL EXAM:    Most Recent Value  Orbital Region  Unable to assess  Upper Arm Region  Unable to assess  Thoracic and Lumbar Region  Unable to assess  Buccal Region  Mild depletion  Temple Region  Mild depletion  Clavicle Bone Region  Moderate depletion  Clavicle and Acromion Bone Region  Moderate depletion  Scapular Bone Region  Unable to assess  Dorsal Hand  Unable to assess  Patellar Region  Severe depletion  Anterior Thigh Region  Severe depletion  Posterior Calf Region  Moderate depletion  Edema (RD Assessment)  None  Hair  Reviewed  Eyes  Unable to assess  Mouth  Unable to assess  Skin  Reviewed  Nails  Unable to assess       Diet Order:   Diet Order            Diet clear liquid Room service appropriate? Yes; Fluid consistency: Thin  Diet effective now              EDUCATION NEEDS:   No education needs have been identified at this time  Skin:  Skin Assessment: Skin Integrity Issues: Skin Integrity Issues:: Stage I Stage I: coccyx  Last BM:  unknown  Height:   Ht Readings from Last 1 Encounters:  10/03/17 5\' 1"   (1.549 m)    Weight:   Wt Readings from Last 1 Encounters:  10/03/17 49 kg    Ideal Body Weight:  47.7 kg  BMI:  Body mass index is 20.41 kg/m.  Estimated Nutritional Needs:   Kcal:  1400-1600  Protein:  70-80 gm  Fluid:  1.4 L    Joaquin Courts, RD, LDN, CNSC Pager [phone] After Hours Pager [phone]

## 2017-10-05 ENCOUNTER — Inpatient Hospital Stay (HOSPITAL_COMMUNITY): Payer: Medicare Other

## 2017-10-05 ENCOUNTER — Inpatient Hospital Stay (HOSPITAL_COMMUNITY): Payer: Medicare Other | Admitting: Certified Registered"

## 2017-10-05 ENCOUNTER — Encounter (HOSPITAL_COMMUNITY)
Admission: AD | Disposition: A | Discharge status: Skilled Nursing Facility | Payer: Self-pay | Source: Other Acute Inpatient Hospital | Attending: Neurosurgery

## 2017-10-05 DIAGNOSIS — L8990 Pressure ulcer of unspecified site, unspecified stage: Secondary | ICD-10-CM

## 2017-10-05 HISTORY — PX: CRANIOTOMY: SHX93

## 2017-10-05 LAB — PREPARE RBC (CROSSMATCH)

## 2017-10-05 SURGERY — CRANIOTOMY HEMATOMA EVACUATION SUBDURAL
Anesthesia: General | Site: Head | Laterality: Right

## 2017-10-05 MED ORDER — PHENYLEPHRINE 40 MCG/ML (10ML) SYRINGE FOR IV PUSH (FOR BLOOD PRESSURE SUPPORT)
PREFILLED_SYRINGE | INTRAVENOUS | Status: DC | PRN
  Administered 2017-10-05 (×2): 40 ug via INTRAVENOUS

## 2017-10-05 MED ORDER — LIDOCAINE HCL (CARDIAC) PF 100 MG/5ML IV SOSY
PREFILLED_SYRINGE | INTRAVENOUS | Status: DC | PRN
  Administered 2017-10-05: 60 mg via INTRAVENOUS

## 2017-10-05 MED ORDER — PHENYLEPHRINE 40 MCG/ML (10ML) SYRINGE FOR IV PUSH (FOR BLOOD PRESSURE SUPPORT)
PREFILLED_SYRINGE | INTRAVENOUS | Status: AC
  Filled 2017-10-05: qty 10

## 2017-10-05 MED ORDER — LIDOCAINE-EPINEPHRINE 0.5 %-1:200000 IJ SOLN
INTRAMUSCULAR | Status: AC
  Filled 2017-10-05: qty 1

## 2017-10-05 MED ORDER — SODIUM CHLORIDE 0.9 % IV SOLN
INTRAVENOUS | Status: DC | PRN
  Administered 2017-10-05: 50 ug/min via INTRAVENOUS

## 2017-10-05 MED ORDER — BACITRACIN ZINC 500 UNIT/GM EX OINT
TOPICAL_OINTMENT | CUTANEOUS | Status: DC | PRN
  Administered 2017-10-05: 1 via TOPICAL

## 2017-10-05 MED ORDER — ORAL CARE MOUTH RINSE
15.0000 mL | Freq: Two times a day (BID) | OROMUCOSAL | Status: DC
  Administered 2017-10-05 – 2017-10-09 (×5): 15 mL via OROMUCOSAL

## 2017-10-05 MED ORDER — FENTANYL CITRATE (PF) 100 MCG/2ML IJ SOLN: 25.0000 ug | INTRAMUSCULAR | Status: DC | PRN

## 2017-10-05 MED ORDER — ROCURONIUM BROMIDE 100 MG/10ML IV SOLN
INTRAVENOUS | Status: DC | PRN
  Administered 2017-10-05: 50 mg via INTRAVENOUS

## 2017-10-05 MED ORDER — MICROFIBRILLAR COLL HEMOSTAT EX PADS
MEDICATED_PAD | CUTANEOUS | Status: DC | PRN
  Administered 2017-10-05: 1 via TOPICAL

## 2017-10-05 MED ORDER — ETOMIDATE 2 MG/ML IV SOLN
INTRAVENOUS | Status: DC | PRN
  Administered 2017-10-05: 16 mg via INTRAVENOUS

## 2017-10-05 MED ORDER — EPHEDRINE 5 MG/ML INJ
INTRAVENOUS | Status: AC
  Filled 2017-10-05: qty 10

## 2017-10-05 MED ORDER — ONDANSETRON HCL 4 MG/2ML IJ SOLN
INTRAMUSCULAR | Status: DC | PRN
  Administered 2017-10-05: 4 mg via INTRAVENOUS

## 2017-10-05 MED ORDER — THROMBIN 20000 UNITS EX KIT
PACK | CUTANEOUS | Status: AC
  Filled 2017-10-05: qty 1

## 2017-10-05 MED ORDER — DEXAMETHASONE SODIUM PHOSPHATE 10 MG/ML IJ SOLN
10.0000 mg | Freq: Four times a day (QID) | INTRAMUSCULAR | Status: AC
  Administered 2017-10-05 – 2017-10-08 (×12): 10 mg via INTRAVENOUS
  Filled 2017-10-05 (×12): qty 1

## 2017-10-05 MED ORDER — FENTANYL CITRATE (PF) 250 MCG/5ML IJ SOLN
INTRAMUSCULAR | Status: AC
  Filled 2017-10-05: qty 5

## 2017-10-05 MED ORDER — THROMBIN 20000 UNITS EX SOLR
CUTANEOUS | Status: DC | PRN
  Administered 2017-10-05: 20 mL via TOPICAL

## 2017-10-05 MED ORDER — 0.9 % SODIUM CHLORIDE (POUR BTL) OPTIME
TOPICAL | Status: DC | PRN
  Administered 2017-10-05: 1000 mL

## 2017-10-05 MED ORDER — SUGAMMADEX SODIUM 200 MG/2ML IV SOLN
INTRAVENOUS | Status: DC | PRN
  Administered 2017-10-05: 100 mg via INTRAVENOUS

## 2017-10-05 MED ORDER — HYDRALAZINE HCL 20 MG/ML IJ SOLN
INTRAMUSCULAR | Status: DC | PRN
  Administered 2017-10-05 (×2): 5 mg via INTRAVENOUS

## 2017-10-05 MED ORDER — SODIUM CHLORIDE 0.9 % IV SOLN: 10.0000 mL/h | Freq: Once | INTRAVENOUS | Status: DC

## 2017-10-05 MED ORDER — EPHEDRINE SULFATE-NACL 50-0.9 MG/10ML-% IV SOSY
PREFILLED_SYRINGE | INTRAVENOUS | Status: DC | PRN
  Administered 2017-10-05: 10 mg via INTRAVENOUS
  Administered 2017-10-05: 5 mg via INTRAVENOUS

## 2017-10-05 MED ORDER — SODIUM CHLORIDE 0.9 % IV SOLN
INTRAVENOUS | Status: DC | PRN
  Administered 2017-10-05: 13:00:00 via INTRAVENOUS

## 2017-10-05 MED ORDER — ESMOLOL HCL 100 MG/10ML IV SOLN
INTRAVENOUS | Status: DC | PRN
  Administered 2017-10-05 (×2): 10 mg via INTRAVENOUS
  Administered 2017-10-05 (×3): 20 mg via INTRAVENOUS

## 2017-10-05 MED ORDER — FENTANYL CITRATE (PF) 100 MCG/2ML IJ SOLN
INTRAMUSCULAR | Status: DC | PRN
  Administered 2017-10-05 (×4): 50 ug via INTRAVENOUS

## 2017-10-05 MED ORDER — CEFAZOLIN SODIUM 1 G IJ SOLR
INTRAMUSCULAR | Status: DC | PRN
  Administered 2017-10-05: 1 g via INTRAMUSCULAR

## 2017-10-05 MED ORDER — BACITRACIN ZINC 500 UNIT/GM EX OINT
TOPICAL_OINTMENT | CUTANEOUS | Status: AC
  Filled 2017-10-05: qty 28.35

## 2017-10-05 MED ORDER — DEXAMETHASONE SODIUM PHOSPHATE 4 MG/ML IJ SOLN
INTRAMUSCULAR | Status: DC | PRN
  Administered 2017-10-05: 10 mg via INTRAVENOUS

## 2017-10-05 MED ORDER — PROPOFOL 10 MG/ML IV BOLUS
INTRAVENOUS | Status: AC
  Filled 2017-10-05: qty 20

## 2017-10-05 MED ORDER — ESMOLOL HCL 100 MG/10ML IV SOLN
INTRAVENOUS | Status: AC
  Filled 2017-10-05: qty 10

## 2017-10-05 SURGICAL SUPPLY — 66 items
APL SKNCLS STERI-STRIP NONHPOA (GAUZE/BANDAGES/DRESSINGS)
BENZOIN TINCTURE PRP APPL 2/3 (GAUZE/BANDAGES/DRESSINGS) IMPLANT
BLADE CLIPPER SURG (BLADE) IMPLANT
BLADE ULTRA TIP 2M (BLADE) IMPLANT
BNDG GAUZE ELAST 4 BULKY (GAUZE/BANDAGES/DRESSINGS) IMPLANT
BUR ACORN 6.0 PRECISION (BURR) ×2 IMPLANT
BUR MATCHSTICK NEURO 3.0 LAGG (BURR) IMPLANT
BUR SPIRAL ROUTER 2.3 (BUR) IMPLANT
CANISTER SUCT 3000ML PPV (MISCELLANEOUS) ×4 IMPLANT
CARTRIDGE OIL MAESTRO DRILL (MISCELLANEOUS) ×1 IMPLANT
CLIP VESOCCLUDE MED 6/CT (CLIP) IMPLANT
DIFFUSER DRILL AIR PNEUMATIC (MISCELLANEOUS) ×2 IMPLANT
DRAPE NEUROLOGICAL W/INCISE (DRAPES) ×2 IMPLANT
DRAPE SURG 17X23 STRL (DRAPES) IMPLANT
DRAPE WARM FLUID 44X44 (DRAPE) ×2 IMPLANT
DRSG TELFA 3X8 NADH (GAUZE/BANDAGES/DRESSINGS) ×2 IMPLANT
DURAPREP 6ML APPLICATOR 50/CS (WOUND CARE) IMPLANT
ELECT REM PT RETURN 9FT ADLT (ELECTROSURGICAL) ×2
ELECTRODE REM PT RTRN 9FT ADLT (ELECTROSURGICAL) ×1 IMPLANT
EVACUATOR 1/8 PVC DRAIN (DRAIN) IMPLANT
EVACUATOR SILICONE 100CC (DRAIN) IMPLANT
GAUZE 4X4 16PLY RFD (DISPOSABLE) IMPLANT
GAUZE SPONGE 4X4 12PLY STRL (GAUZE/BANDAGES/DRESSINGS) ×2 IMPLANT
GLOVE BIO SURGEON STRL SZ 6.5 (GLOVE) ×2 IMPLANT
GLOVE BIO SURGEON STRL SZ7 (GLOVE) ×2 IMPLANT
GLOVE BIOGEL PI IND STRL 6.5 (GLOVE) ×2 IMPLANT
GLOVE BIOGEL PI IND STRL 7.5 (GLOVE) ×1 IMPLANT
GLOVE BIOGEL PI INDICATOR 6.5 (GLOVE) ×2
GLOVE BIOGEL PI INDICATOR 7.5 (GLOVE) ×1
GLOVE ECLIPSE 6.5 STRL STRAW (GLOVE) ×2 IMPLANT
GLOVE EXAM NITRILE LRG STRL (GLOVE) IMPLANT
GLOVE EXAM NITRILE XL STR (GLOVE) IMPLANT
GLOVE EXAM NITRILE XS STR PU (GLOVE) IMPLANT
GOWN STRL REUS W/ TWL LRG LVL3 (GOWN DISPOSABLE) ×2 IMPLANT
GOWN STRL REUS W/ TWL XL LVL3 (GOWN DISPOSABLE) IMPLANT
GOWN STRL REUS W/TWL 2XL LVL3 (GOWN DISPOSABLE) IMPLANT
GOWN STRL REUS W/TWL LRG LVL3 (GOWN DISPOSABLE) ×4
GOWN STRL REUS W/TWL XL LVL3 (GOWN DISPOSABLE)
HEMOSTAT SURGICEL 2X14 (HEMOSTASIS) IMPLANT
KIT BASIN OR (CUSTOM PROCEDURE TRAY) ×2 IMPLANT
KIT TURNOVER KIT B (KITS) ×2 IMPLANT
NEEDLE HYPO 25X1 1.5 SAFETY (NEEDLE) ×2 IMPLANT
NS IRRIG 1000ML POUR BTL (IV SOLUTION) ×6 IMPLANT
OIL CARTRIDGE MAESTRO DRILL (MISCELLANEOUS) ×2
PACK CRANIOTOMY CUSTOM (CUSTOM PROCEDURE TRAY) ×2 IMPLANT
PATTIES SURGICAL .5 X.5 (GAUZE/BANDAGES/DRESSINGS) IMPLANT
PATTIES SURGICAL .5 X3 (DISPOSABLE) IMPLANT
PATTIES SURGICAL 1X1 (DISPOSABLE) IMPLANT
PLATE 1.5/0.5 22MM BURR HO (Plate) ×2 IMPLANT
SCREW SELF DRILL HT 1.5/4MM (Screw) ×8 IMPLANT
SPONGE NEURO XRAY DETECT 1X3 (DISPOSABLE) IMPLANT
SPONGE SURGIFOAM ABS GEL 100 (HEMOSTASIS) ×2 IMPLANT
STAPLER VISISTAT 35W (STAPLE) ×2 IMPLANT
SUT ETHILON 3 0 FSL (SUTURE) ×2 IMPLANT
SUT ETHILON 3 0 PS 1 (SUTURE) IMPLANT
SUT NURALON 4 0 TR CR/8 (SUTURE) ×4 IMPLANT
SUT STEEL 0 (SUTURE)
SUT STEEL 0 18XMFL TIE 17 (SUTURE) IMPLANT
SUT VIC AB 2-0 CT2 18 VCP726D (SUTURE) ×4 IMPLANT
TAPE CLOTH SURG 4X10 WHT LF (GAUZE/BANDAGES/DRESSINGS) ×2 IMPLANT
TOWEL GREEN STERILE (TOWEL DISPOSABLE) ×2 IMPLANT
TOWEL GREEN STERILE FF (TOWEL DISPOSABLE) ×2 IMPLANT
TRAY FOLEY MTR SLVR 16FR STAT (SET/KITS/TRAYS/PACK) ×2 IMPLANT
TUBE CONNECTING 12X1/4 (SUCTIONS) ×2 IMPLANT
UNDERPAD 30X30 (UNDERPADS AND DIAPERS) ×2 IMPLANT
WATER STERILE IRR 1000ML POUR (IV SOLUTION) ×2 IMPLANT

## 2017-10-05 NOTE — Plan of Care (Signed)
Patient is cooperative with care and medications.

## 2017-10-05 NOTE — Progress Notes (Signed)
Patient ID: Melissa Bentley, female   DOB: 1927-07-22, 84 y.o.   MRN: [id number] BP 111/77 (BP Location: Right Arm)   Pulse (!) 56   Temp 98.2 F (36.8 C) (Axillary)   Resp 14   Ht 5\' 1"  (1.549 m)   Wt 49 kg   SpO2 99%   BMI 20.41 kg/m  Ms. daviau is somnolent, and difficult to arouse. She is protecting her airway, and breathing comfortably. She will respond to noxious stimuli appropriately, and is plegic on her left side.  Repeat CT today showed a recurrence of the hematoma with a 1cm of midline shift.  I recommended and the family has agreed. The attendant risks and benefits were explained. They understand and wish to proceed.

## 2017-10-05 NOTE — Progress Notes (Signed)
This RN noticed patient had less movement on her left arm and leg compared to earlier this morning.  Notified MD.  Orders received for STAT CT.  Will continue to monitor patient.

## 2017-10-05 NOTE — Anesthesia Postprocedure Evaluation (Signed)
Anesthesia Post Note  Patient: Melissa Bentley  Procedure(s) Performed: REDO CRANIOTOMY HEMATOMA EVACUATION SUBDURAL (Right Head)     Patient location during evaluation: PACU Anesthesia Type: General Level of consciousness: awake Pain management: pain level controlled Vital Signs Assessment: post-procedure vital signs reviewed and stable Respiratory status: spontaneous breathing Cardiovascular status: stable Postop Assessment: no apparent nausea or vomiting Anesthetic complications: no    Last Vitals:  Vitals:   10/05/17 1553 10/05/17 1600  BP: 131/69   Pulse: 77 77  Resp: 14 17  Temp:    SpO2: 97% 95%    Last Pain:  Vitals:   10/05/17 1200  TempSrc: Axillary  PainSc: Asleep                 Feleica Fulmore

## 2017-10-05 NOTE — Progress Notes (Signed)
No issues or problems overnight however earlier this morning the patient has less movement and spontaneous on the left with some increased somnolence.  She is afebrile.  Her heart rate is normal.  Respiratory rate is normal.  Blood pressure well controlled.  Saturations are good patient will awaken.  Patient somnolent but will awaken to voice.  She answers questions but her voice is weak and she drifts off to sleep again.  She follows commands on the right side.  She weakly flexes her left side to pain.  Her wound is clean and dry.  Chest and abdomen are stable.  Emergent follow-up head CT scan was obtained.  This demonstrates postoperative change on the right.  The patient has some minimal re-hemorrhage on the right posterior parietal occipital region.  She has a small amount of residual subdural blood.  She has a large amount of trapped intracranial air with some compression consistent with tension pneumocephalus.  I believe this patient is suffering symptoms secondary to some residual subdural hematoma and her tension pneumocephalus.  Given her advanced age I would like to avoid reoperation if possible.  We will try treatment with a nonrebreather facemask 100% oxygen and add steroids to help with some cortical edema.

## 2017-10-05 NOTE — Op Note (Signed)
10/05/2017  3:58 PM  PATIENT:  Melissa Bentley  84 y.o. female with increased lethargy, and ct showing hematoma recurrence.   PRE-OPERATIVE DIAGNOSIS:  Subdural hematoma, recurrent POST-OPERATIVE DIAGNOSIS:  Subdural hematoma, recurrent  PROCEDURE:  Procedure(s): right parietal REDO CRANIOTOMY HEMATOMA EVACUATION SUBDURAL  SURGEON: Surgeon(s): Coletta Memos, MD  ASSISTANTS:Bergman, Meghan  ANESTHESIA:   general  EBL:  Total I/O In: 1085 [I.V.:405; Blood:630; IV Piggyback:50] Out: 775 [Urine:575; Blood:200]  BLOOD ADMINISTERED:2 CC PRBC  CELL SAVER GIVEN:none  COUNT:per nursing  DRAINS: (1) Hemovact drain(s) in the subdural space with  Suction Open   SPECIMEN:  No Specimen  DICTATION: Melissa Bentley was taken to the operating room, intubated, and placed under a general anesthetic without difficulty. She was positioned on a horseshoe head rest. Her head was prepped and draped in a sterile manner. I removed the staples. Then divided and removed the galeal sutures to expose the bone flap. We removed the screws from the skull side of the plates, and removed the bone flap. We opened the previous incision in the dura and evacuated clotted and liquefied blood. I drilled a hole in the middle of the bone flap. I then placed a medium hemovac drain through the hole and into the subdural space. We then approximated the dura. Then the bone flap with screws in the existing plates. We closed the galea with vicryl sutures, and the scalp edges with staples. I placed a nylon around the drain exit site. A sterile dressing was applied and she will be taken to the ICU.   PLAN OF CARE: Admit to inpatient   PATIENT DISPOSITION:  PACU - hemodynamically stable.   Delay start of Pharmacological VTE agent (>24hrs) due to surgical blood loss or risk of bleeding:  yes

## 2017-10-05 NOTE — Progress Notes (Signed)
PT Cancellation Note  Patient Details Name: Melissa Bentley MRN: [id number] DOB: [birth year]   Cancelled Treatment:    Reason Eval/Treat Not Completed: Active bedrest order   Will follow,  Van Clines, PT  Acute Rehabilitation Services Pager [phone] Office [phone]    Levi Aland 10/05/2017, 8:24 AM

## 2017-10-05 NOTE — Anesthesia Postprocedure Evaluation (Signed)
Anesthesia Post Note  Patient: Melissa Bentley  Procedure(s) Performed: REDO CRANIOTOMY HEMATOMA EVACUATION SUBDURAL (Right Head)     Anesthesia Type: General Level of consciousness: awake Vital Signs Assessment: post-procedure vital signs reviewed and stable Respiratory status: spontaneous breathing Cardiovascular status: stable Postop Assessment: no apparent nausea or vomiting Anesthetic complications: no    Last Vitals:  Vitals:   10/05/17 1553 10/05/17 1600  BP: 131/69   Pulse: 77 77  Resp: 14 17  Temp: (P) 36.5 C   SpO2: 97% 95%    Last Pain:  Vitals:   10/05/17 1200  TempSrc: Axillary  PainSc: Asleep                 Yekaterina Escutia

## 2017-10-05 NOTE — Anesthesia Procedure Notes (Signed)
Procedure Name: Intubation Date/Time: 10/05/2017 1:38 PM Performed by: Sammie Bench, CRNA Pre-anesthesia Checklist: Patient identified, Emergency Drugs available, Suction available and Patient being monitored Patient Re-evaluated:Patient Re-evaluated prior to induction Oxygen Delivery Method: Circle System Utilized Preoxygenation: Pre-oxygenation with 100% oxygen Induction Type: IV induction Ventilation: Mask ventilation without difficulty Laryngoscope Size: Mac and 3 Grade View: Grade I Tube type: Oral Tube size: 7.0 mm Number of attempts: 1 Airway Equipment and Method: Stylet and Oral airway Placement Confirmation: ETT inserted through vocal cords under direct vision,  positive ETCO2 and breath sounds checked- equal and bilateral Tube secured with: Tape Dental Injury: Teeth and Oropharynx as per pre-operative assessment

## 2017-10-05 NOTE — Anesthesia Preprocedure Evaluation (Addendum)
Anesthesia Evaluation  Patient identified by MRN, date of birth, ID band Patient awake    Reviewed: Allergy & Precautions, NPO status , Patient's Chart, lab work & pertinent test results  Airway Mallampati: II  TM Distance: >3 FB     Dental   Pulmonary former smoker,    breath sounds clear to auscultation       Cardiovascular hypertension, + CAD, + Past MI and + Peripheral Vascular Disease   Rhythm:Regular Rate:Normal     Neuro/Psych    GI/Hepatic Neg liver ROS, GERD  ,  Endo/Other  Hypothyroidism   Renal/GU negative Renal ROS     Musculoskeletal   Abdominal   Peds  Hematology   Anesthesia Other Findings   Reproductive/Obstetrics                             Anesthesia Physical Anesthesia Plan  ASA: IV  Anesthesia Plan: General   Post-op Pain Management:    Induction: Intravenous  PONV Risk Score and Plan: Ondansetron and Dexamethasone  Airway Management Planned: Oral ETT  Additional Equipment:   Intra-op Plan:   Post-operative Plan: Possible Post-op intubation/ventilation  Informed Consent:   Dental advisory given  Plan Discussed with: CRNA, Anesthesiologist and Surgeon  Anesthesia Plan Comments:         Anesthesia Quick Evaluation

## 2017-10-05 NOTE — Anesthesia Procedure Notes (Signed)
Arterial Line Insertion Start/End9/08/2017 1:20 PM, 10/05/2017 1:28 PM Performed by: Lucinda Dell, CRNA, CRNA  Patient location: Pre-op. Preanesthetic checklist: patient identified, IV checked, monitors and equipment checked and pre-op evaluation Emergency situation Lidocaine 1% used for infiltration Left, radial was placed Catheter size: 20 G Hand hygiene performed  and maximum sterile barriers used  Allen's test indicative of satisfactory collateral circulation Attempts: 1 Procedure performed without using ultrasound guided technique. Following insertion, dressing applied and Biopatch. Post procedure assessment: normal  Patient tolerated the procedure well with no immediate complications.

## 2017-10-05 NOTE — Transfer of Care (Signed)
Immediate Anesthesia Transfer of Care Note  Patient: Melissa Bentley  Procedure(s) Performed: REDO CRANIOTOMY HEMATOMA EVACUATION SUBDURAL (Right Head)  Patient Location: PACU  Anesthesia Type:General  Level of Consciousness: awake and patient cooperative  Airway & Oxygen Therapy: Patient Spontanous Breathing and Patient connected to face mask oxygen  Post-op Assessment: Report given to RN and Post -op Vital signs reviewed and stable  Post vital signs: Reviewed and stable  Last Vitals:  Vitals Value Taken Time  BP 113/50 10/05/2017  4:08 PM  Temp    Pulse 84 10/05/2017  4:16 PM  Resp 22 10/05/2017  4:16 PM  SpO2 96 % 10/05/2017  4:16 PM  Vitals shown include unvalidated device data.  Last Pain:  Vitals:   10/05/17 1200  TempSrc: Axillary  PainSc: Asleep      Patients Stated Pain Goal: 3 (10/03/17 1451)  Complications: No apparent anesthesia complications

## 2017-10-05 NOTE — Progress Notes (Signed)
SLP Cancellation Note  Patient Details Name: Melissa Bentley MRN: [id number] DOB: 28-Sep-1927   Cancelled treatment:       Reason Eval/Treat Not Completed: Medical issues which prohibited therapy.  Per nurse the patient requiring repeat surgery.  ST will follow up.  Dimas Aguas, MA, CCC-SLP Acute Rehab SLP [phone] [name] 10/05/2017, 12:46 PM

## 2017-10-06 ENCOUNTER — Encounter (HOSPITAL_COMMUNITY): Payer: Self-pay | Admitting: Neurosurgery

## 2017-10-06 LAB — POCT I-STAT 4, (NA,K, GLUC, HGB,HCT)
Glucose, Bld: 98 mg/dL (ref 70–99)
HCT: 24 % — ABNORMAL LOW (ref 36.0–46.0)
Hemoglobin: 8.2 g/dL — ABNORMAL LOW (ref 12.0–15.0)
Potassium: 4 mmol/L (ref 3.5–5.1)
Sodium: 140 mmol/L (ref 135–145)

## 2017-10-06 LAB — GLUCOSE, CAPILLARY: Glucose-Capillary: 152 mg/dL — ABNORMAL HIGH (ref 70–99)

## 2017-10-06 LAB — TYPE AND SCREEN
ABO/RH(D): O NEG
Antibody Screen: NEGATIVE
Unit division: 0
Unit division: 0

## 2017-10-06 LAB — BPAM RBC
Blood Product Expiration Date: 201910142359
Blood Product Expiration Date: 201910142359
ISSUE DATE / TIME: 201909081342
ISSUE DATE / TIME: 201909081342
Unit Type and Rh: 9500
Unit Type and Rh: 9500

## 2017-10-06 MED FILL — Thrombin For Soln Kit 20000 Unit: CUTANEOUS | Qty: 1 | Status: AC

## 2017-10-06 NOTE — Evaluation (Signed)
Physical Therapy Evaluation Patient Details Name: Melissa Bentley MRN: [id number] DOB: 01-Aug-1927 Today's Date: 10/06/2017   History of Present Illness  pt is an 84 y/o female with pmh significant for CAD, HTN, MI, Osteoporosis, PVD, admitted after falling several times with known SDH by CT.  Pt s/p crani for evac of SDH on 9/6 and redo crani for recurrent SDH with worsening arousal and L sided plegia 9/8.  Clinical Impression  Pt admitted with/for recurring SDH s/p 2 crani's for evacuation.  Pt needing a light mod assist for basic mobility at this time.  Pt currently limited functionally due to the problems listed. ( See problems list.)   Pt will benefit from PT to maximize function and safety in order to get ready for next venue listed below.     Follow Up Recommendations SNF;Supervision/Assistance - 24 hour;Other (comment)(family likes thought of less intensity longer time to recove)    Equipment Recommendations  None recommended by PT    Recommendations for Other Services       Precautions / Restrictions Precautions Precautions: Fall      Mobility  Bed Mobility Overal bed mobility: Needs Assistance Bed Mobility: Rolling;Sidelying to Sit Rolling: Mod assist Sidelying to sit: Mod assist       General bed mobility comments: cues for sequencing,  assist for incoordinated L side and stability.  Transfers Overall transfer level: Needs assistance   Transfers: Sit to/from Stand;Stand Pivot Transfers Sit to Stand: Mod assist Stand pivot transfers: Mod assist       General transfer comment: stability assist, w/shift and assist to sidestep/pivot.  Ambulation/Gait                Stairs            Wheelchair Mobility    Modified Rankin (Stroke Patients Only) Modified Rankin (Stroke Patients Only) Pre-Morbid Rankin Score: No symptoms Modified Rankin: Moderately severe disability     Balance Overall balance assessment: Needs assistance Sitting-balance  support: No upper extremity supported;Single extremity supported;Feet supported Sitting balance-Leahy Scale: Fair Sitting balance - Comments: fair once "set up" at EOB   Standing balance support: Bilateral upper extremity supported;Single extremity supported Standing balance-Leahy Scale: Poor Standing balance comment: face to face assist in standing                             Pertinent Vitals/Pain Pain Assessment: Faces Faces Pain Scale: No hurt    Home Living Family/patient expects to be discharged to:: Private residence Living Arrangements: Alone Available Help at Discharge: Family;Available PRN/intermittently(2 sons close by) Type of Home: House Home Access: Stairs to enter   [employer] of Steps: 1 Home Layout: One level Home Equipment: Walker - 2 wheels;Cane - single point;Bedside commode;Shower seat;Wheelchair - manual      Prior Function Level of Independence: Independent         Comments: drove, ran errands, completed all her ADL's, but sons have started talking to her about her needing someone with her.  At this point she refuses.     Hand Dominance        Extremity/Trunk Assessment   Upper Extremity Assessment Upper Extremity Assessment: RUE deficits/detail;LUE deficits/detail RUE Deficits / Details: strength grossly 4/5, mild incoordination and decreased position awareness RUE Coordination: decreased fine motor LUE Deficits / Details: grossly 3+/5, moderate incoordination and decreased proprioceptive awareness. LUE Coordination: decreased fine motor    Lower Extremity Assessment Lower Extremity Assessment: RLE deficits/detail;LLE  deficits/detail RLE Deficits / Details: WFL LLE Deficits / Details: grossly 4-/5, Brunstrom 2 to 3, movements still synergistic LLE Sensation: decreased proprioception LLE Coordination: decreased fine motor    Cervical / Trunk Assessment Cervical / Trunk Assessment: Kyphotic  Communication    Communication: No difficulties  Cognition Arousal/Alertness: Awake/alert Behavior During Therapy: WFL for tasks assessed/performed Overall Cognitive Status: Within Functional Limits for tasks assessed(not tested formally)                                        General Comments General comments (skin integrity, edema, etc.): vss    Exercises     Assessment/Plan    PT Assessment Patient needs continued PT services  PT Problem List Decreased strength;Decreased activity tolerance;Decreased balance;Decreased mobility;Decreased coordination;Decreased knowledge of precautions       PT Treatment Interventions Gait training;Functional mobility training;Therapeutic activities;Balance training;Neuromuscular re-education;Patient/family education;DME instruction    PT Goals (Current goals can be found in the Care Plan section)  Acute Rehab PT Goals Patient Stated Goal: able to do for myself at home PT Goal Formulation: With patient Time For Goal Achievement: 10/20/17 Potential to Achieve Goals: Fair    Frequency Min 3X/week   Barriers to discharge        Co-evaluation               AM-PAC PT "6 Clicks" Daily Activity  Outcome Measure Difficulty turning over in bed (including adjusting bedclothes, sheets and blankets)?: Unable Difficulty moving from lying on back to sitting on the side of the bed? : Unable Difficulty sitting down on and standing up from a chair with arms (e.g., wheelchair, bedside commode, etc,.)?: Unable Help needed moving to and from a bed to chair (including a wheelchair)?: A Lot Help needed walking in hospital room?: A Lot Help needed climbing 3-5 steps with a railing? : A Lot 6 Click Score: 9    End of Session   Activity Tolerance: Patient tolerated treatment well Patient left: in chair;with call bell/phone within reach;with family/visitor present Nurse Communication: Mobility status PT Visit Diagnosis: Hemiplegia and hemiparesis;Other  symptoms and signs involving the nervous system (R29.898);Other abnormalities of gait and mobility (R26.89) Hemiplegia - Right/Left: Left Hemiplegia - dominant/non-dominant: Non-dominant Hemiplegia - caused by: Nontraumatic intracerebral hemorrhage    Time: 1054-1140 PT Time Calculation (min) (ACUTE ONLY): 46 min   Charges:   PT Evaluation $PT Eval Moderate Complexity: 1 Mod PT Treatments $Therapeutic Activity: 8-22 mins $Neuromuscular Re-education: 8-22 mins        10/06/2017  Sibley Bing, PT Acute Rehabilitation Services [phone]  (pager) [phone]  (office)  Eliseo Gum Prem Coykendall 10/06/2017, 12:02 PM

## 2017-10-06 NOTE — Evaluation (Signed)
Clinical/Bedside Swallow Evaluation Patient Details  Name: Melissa Bentley MRN: [id number] Date of Birth: 08-07-1927  Today's Date: 10/06/2017 Time: SLP Start Time (ACUTE ONLY): 1400 SLP Stop Time (ACUTE ONLY): 1410 SLP Time Calculation (min) (ACUTE ONLY): 10 min  Past Medical History:  Past Medical History:  Diagnosis Date  . CAD (coronary artery disease)   . Depression   . GERD (gastroesophageal reflux disease)   . Hyperlipidemia   . Hypertension   . Hypothyroidism   . MI (myocardial infarction) (HCC)   . Osteoporosis   . Psoriasis   . PVD (peripheral vascular disease) (HCC)    Past Surgical History:  Past Surgical History:  Procedure Laterality Date  . ABDOMINAL HYSTERECTOMY    . COLONOSCOPY N/A 02/01/2015   Dr. Rourk:colonic diverticulosis s/p biopsy with unremarkable colonic mucosa, no microscopic colitis.   . CORONARY ANGIOPLASTY WITH STENT PLACEMENT    . LUNG LOBECTOMY    . SUBCLAVIAN ARTERY STENT    . TUBAL LIGATION     HPI:  pt is an 84 y/o female with pmh significant for CAD, HTN, MI, Osteoporosis, PVD, admitted after falling several times with known SDH by CT.  Pt s/p crani for evac of SDH on 9/6 and redo crani for recurrent SDH with worsening arousal and L sided plegia 9/8.   Assessment / Plan / Recommendation Clinical Impression  Pt demonstrates normal swallow function, though she is edentulous and in the process of getting dentures. She reports she has been eating soft foods PTA. Recommend initiating a dys 2 (chopped) diet with thin liquids. No SLP f/u needed for swallowing.  SLP Visit Diagnosis: Dysphagia, unspecified (R13.10)    Aspiration Risk  Mild aspiration risk    Diet Recommendation Dysphagia 2 (Fine chop);Thin liquid   Liquid Administration via: Cup;Straw Medication Administration: Whole meds with liquid Supervision: Patient able to self feed Compensations: Slow rate;Small sips/bites Postural Changes: Seated upright at 90 degrees    Other   Recommendations Oral Care Recommendations: Oral care BID   Follow up Recommendations        Frequency and Duration            Prognosis        Swallow Study   General HPI: pt is an 84 y/o female with pmh significant for CAD, HTN, MI, Osteoporosis, PVD, admitted after falling several times with known SDH by CT.  Pt s/p crani for evac of SDH on 9/6 and redo crani for recurrent SDH with worsening arousal and L sided plegia 9/8. Type of Study: Bedside Swallow Evaluation Previous Swallow Assessment: none Diet Prior to this Study: NPO Temperature Spikes Noted: No Respiratory Status: Room air History of Recent Intubation: (for surgery) Behavior/Cognition: Alert;Cooperative;Pleasant mood Oral Cavity Assessment: Within Functional Limits Oral Care Completed by SLP: No Oral Cavity - Dentition: Edentulous Vision: Functional for self-feeding Self-Feeding Abilities: Needs assist Patient Positioning: Upright in chair Baseline Vocal Quality: Normal Volitional Cough: Strong Volitional Swallow: Able to elicit    Oral/Motor/Sensory Function Overall Oral Motor/Sensory Function: Within functional limits   Ice Chips     Thin Liquid Thin Liquid: Within functional limits Presentation: Cup;Straw;Self Fed    Nectar Thick Nectar Thick Liquid: Not tested   Honey Thick Honey Thick Liquid: Not tested   Puree Presentation: Spoon   Solid     Solid: Not tested     Harlon Ditty, MA CCC-SLP [phone]  Tucker Steedley, Riley Nearing 10/06/2017,2:50 PM

## 2017-10-06 NOTE — Progress Notes (Signed)
Patient ID: Melissa Bentley, female   DOB: August 20, 1927, 84 y.o.   MRN: [id number] BP (!) 141/64   Pulse 78   Temp 98.1 F (36.7 C) (Oral)   Resp 18   Ht 5\' 1"  (1.549 m)   Wt 49 kg   SpO2 97%   BMI 20.41 kg/m  Alert, oriented to person, place situation. She is following commands [name] passed her swallow evaluation. She is moving her extremities, left side has improved.  Wound is clean, dry, drain in place. Significant improvement over the course of the day.  Will start pt and ot.

## 2017-10-07 NOTE — Clinical Social Work Note (Signed)
Clinical Social Work Assessment  Patient Details  Name: Melissa Bentley MRN: [id number] Date of Birth: 1927-03-21  Date of referral:  10/07/17               Reason for consult:  Discharge Planning, Facility Placement                Permission sought to share information with:    Permission granted to share information::     Name::        Agency::     Relationship::     Contact Information:     Housing/Transportation Living arrangements for the past 2 months:  Single Family Home Source of Information:  Patient, Adult Children Patient Interpreter Needed:  None Criminal Activity/Legal Involvement Pertinent to Current Situation/Hospitalization:    Significant Relationships:  Adult Children Lives with:  Self Do you feel safe going back to the place where you live?  No Need for family participation in patient care:  Yes (Comment)  Care giving concerns:  No concerns voiced at this time.    Social Worker assessment / plan: CSW met with patient and son, [name], via bedside to discuss disposition plans. CSW information patient and son that PT had met with patient and is currently recommending SNF placement once medically stable for discharge. Both patient and son are agreeable to SNF placement at this time and would prefer a facility closer to their home in [place]- specifically [facility] or [facility]. CSW will complete information needed and follow up with patient/ son once facilities have accepted/ declined.   Employment status:  Retired Forensic scientist:  Medicare PT Recommendations:  [place] / Referral to community resources:  [place]  Patient/Family's Response to care:  Patient and son were both pleasant and appropriate during conversation.    Patient/Family's Understanding of and Emotional Response to Diagnosis, Current Treatment, and Prognosis:  Patient and son understand current disposition plans.   Emotional  Assessment Appearance:  Appears stated age Attitude/Demeanor/Rapport:    Affect (typically observed):  Accepting, Calm, Pleasant, Hopeful Orientation:  Oriented to Self, Oriented to Situation, Oriented to Place, Oriented to  Time Alcohol / Substance use:    Psych involvement (Current and /or in the community):  No (Comment)  Discharge Needs  Concerns to be addressed:  No discharge needs identified Readmission within the last 30 days:  No Current discharge risk:  None Barriers to Discharge:      Weston Anna, LCSW 10/07/2017, 2:14 PM

## 2017-10-07 NOTE — Progress Notes (Signed)
Patient ID: Melissa Bentley, female   DOB: 28-Jan-1928, 84 y.o.   MRN: [id number] BP (!) 165/68   Pulse 66   Temp 97.6 F (36.4 C) (Axillary)   Resp (!) 21   Ht 5\' 1"  (1.549 m)   Wt 49 kg   SpO2 95%   BMI 20.41 kg/m  Alert, oriented to person, place, situation,  Moving all extremities Drain in place will remove tomorrow Has done very well after second  Craniotomy.  Wound is clean, dry, no signs of infection

## 2017-10-07 NOTE — Progress Notes (Signed)
Physical Therapy Treatment Patient Details Name: Melissa Bentley MRN: [id number] DOB: Mar 16, 1927 Today's Date: 10/07/2017    History of Present Illness Pt is an 84 y/o female with pmh significant for CAD, HTN, MI, Osteoporosis, PVD, admitted after falling several times with known SDH by CT.  Pt s/p crani for evac of SDH on 9/6 and redo crani for recurrent SDH with worsening arousal and L sided plegia 9/8.    PT Comments    Improved over eval with better use of L UE and generally better coordination of LE's though pt's position awareness of R vs Left LE is still diminished.  Emphasis on bed mobility, transfers and gait.    Follow Up Recommendations  SNF;Supervision/Assistance - 24 hour;Other (comment)     Equipment Recommendations  None recommended by PT    Recommendations for Other Services       Precautions / Restrictions Precautions Precautions: Fall Restrictions Weight Bearing Restrictions: No    Mobility  Bed Mobility Overal bed mobility: Needs Assistance Bed Mobility: Supine to Sit     Supine to sit: Min assist     General bed mobility comments: Assist with cues to sequence.   Transfers Overall transfer level: Needs assistance Equipment used: Rolling walker (2 wheeled) Transfers: Sit to/from Stand Sit to Stand: Mod assist;+2 safety/equipment         General transfer comment: Mod assist +2 to power up to standing.   Ambulation/Gait Ambulation/Gait assistance: Mod assist;+2 physical assistance Gait Distance (Feet): 15 Feet(x2, first to the toilet then back to chair via sink.) Assistive device: Rolling walker (2 wheeled);2 person hand held assist Gait Pattern/deviations: Step-through pattern   Gait velocity interpretation: <1.31 ft/sec, indicative of household ambulator General Gait Details: short uncoordinated steps with 1-2 episodes of buckling. pt not aware of limb position R>L   Stairs             Wheelchair Mobility    Modified Rankin  (Stroke Patients Only) Modified Rankin (Stroke Patients Only) Modified Rankin: Moderately severe disability     Balance Overall balance assessment: Needs assistance Sitting-balance support: No upper extremity supported;Single extremity supported;Feet supported Sitting balance-Leahy Scale: Fair     Standing balance support: Bilateral upper extremity supported;Single extremity supported Standing balance-Leahy Scale: Poor Standing balance comment: Relies on UE support and external assistance.                             Cognition Arousal/Alertness: Awake/alert Behavior During Therapy: WFL for tasks assessed/performed Overall Cognitive Status: Impaired/Different from baseline Area of Impairment: Awareness;Problem solving;Safety/judgement                         Safety/Judgement: Decreased awareness of deficits;Decreased awareness of safety Awareness: Emergent Problem Solving: Slow processing General Comments: Pt with decreased awareness of her current deficits.       Exercises      General Comments General comments (skin integrity, edema, etc.): vss      Pertinent Vitals/Pain Pain Assessment: No/denies pain Faces Pain Scale: No hurt    Home Living Family/patient expects to be discharged to:: Private residence Living Arrangements: Alone Available Help at Discharge: Family;Available PRN/intermittently(2 sons close by) Type of Home: House Home Access: Stairs to enter   Home Layout: One level Home Equipment: Environmental consultant - 2 wheels;Cane - single point;Bedside commode;Shower seat;Wheelchair - manual      Prior Function Level of Independence: Independent  Comments: drove, ran errands, completed all her ADL's, but sons have started talking to her about her needing someone with her.  At this point she refuses.   PT Goals (current goals can now be found in the care plan section) Acute Rehab PT Goals Patient Stated Goal: able to do for myself at home PT  Goal Formulation: With patient Time For Goal Achievement: 10/20/17 Potential to Achieve Goals: Fair Progress towards PT goals: Progressing toward goals    Frequency    Min 3X/week      PT Plan Current plan remains appropriate    Co-evaluation PT/OT/SLP Co-Evaluation/Treatment: Yes Reason for Co-Treatment: Complexity of the patient's impairments (multi-system involvement) PT goals addressed during session: Mobility/safety with mobility OT goals addressed during session: ADL's and self-care      AM-PAC PT "6 Clicks" Daily Activity  Outcome Measure  Difficulty turning over in bed (including adjusting bedclothes, sheets and blankets)?: Unable Difficulty moving from lying on back to sitting on the side of the bed? : Unable Difficulty sitting down on and standing up from a chair with arms (e.g., wheelchair, bedside commode, etc,.)?: Unable Help needed moving to and from a bed to chair (including a wheelchair)?: A Lot Help needed walking in hospital room?: A Lot Help needed climbing 3-5 steps with a railing? : A Lot 6 Click Score: 9    End of Session   Activity Tolerance: Patient tolerated treatment well Patient left: in chair;with call bell/phone within reach;with family/visitor present Nurse Communication: Mobility status PT Visit Diagnosis: Hemiplegia and hemiparesis;Other symptoms and signs involving the nervous system (R29.898);Other abnormalities of gait and mobility (R26.89) Hemiplegia - Right/Left: Left Hemiplegia - dominant/non-dominant: Non-dominant Hemiplegia - caused by: Nontraumatic intracerebral hemorrhage     Time: 1230-1253 PT Time Calculation (min) (ACUTE ONLY): 23 min  Charges:  $Gait Training: 8-22 mins                     10/07/2017  [place] Bing, PT Acute Rehabilitation Services [phone]  (pager) [phone]  (office)   Eliseo Gum Alvita Fana 10/07/2017, 2:23 PM

## 2017-10-07 NOTE — Evaluation (Signed)
Occupational Therapy Evaluation Patient Details Name: Melissa Bentley MRN: [id number] DOB: 07-Feb-1927 Today's Date: 10/07/2017    History of Present Illness Pt is an 84 y/o female with pmh significant for CAD, HTN, MI, Osteoporosis, PVD, admitted after falling several times with known SDH by CT.  Pt s/p crani for evac of SDH on 9/6 and redo crani for recurrent SDH with worsening arousal and L sided plegia 9/8.   Clinical Impression   PTA, pt was independent with ADL and fucntional mobility. She currently is limited by posterior lean, L UE decreased coordination and strength, and R UE decreased proprioception. Pt requiring max assist +2 for ambulation to bathroom, total assist for toilet transfers, and mod assist for standing grooming tasks. Pt would benefit from continued OT services while admitted to improve independence and safety with ADL and functional mobility. Recommend SNF level rehabilitation post-acute D/C. OT will continue to follow while admitted.     Follow Up Recommendations  SNF;Supervision/Assistance - 24 hour    Equipment Recommendations  3 in 1 bedside commode    Recommendations for Other Services       Precautions / Restrictions Precautions Precautions: Fall Restrictions Weight Bearing Restrictions: No      Mobility Bed Mobility Overal bed mobility: Needs Assistance Bed Mobility: Supine to Sit     Supine to sit: Min assist     General bed mobility comments: Assist with cues to sequence.   Transfers Overall transfer level: Needs assistance Equipment used: Rolling walker (2 wheeled) Transfers: Sit to/from Stand Sit to Stand: Mod assist;+2 safety/equipment         General transfer comment: Mod assist +2 to power up to standing.     Balance Overall balance assessment: Needs assistance Sitting-balance support: No upper extremity supported;Single extremity supported;Feet supported Sitting balance-Leahy Scale: Fair     Standing balance support:  Bilateral upper extremity supported;Single extremity supported Standing balance-Leahy Scale: Poor Standing balance comment: Relies on UE support and external assistance.                            ADL either performed or assessed with clinical judgement   ADL Overall ADL's : Needs assistance/impaired Eating/Feeding: Minimal assistance;Sitting   Grooming: Moderate assistance;Standing Grooming Details (indicate cue type and reason): Min assist to coordinate movement and mod assist for standing balance.  Upper Body Bathing: Moderate assistance;Sitting   Lower Body Bathing: Maximal assistance;Sit to/from stand;+2 for safety/equipment   Upper Body Dressing : Moderate assistance;Sitting   Lower Body Dressing: Maximal assistance;Sit to/from stand;+2 for safety/equipment   Toilet Transfer: Maximal assistance;Ambulation;RW;+2 for safety/equipment Toilet Transfer Details (indicate cue type and reason): Requiring max assist due to knee buckling and posterior lean with no awareness.  Toileting- Clothing Manipulation and Hygiene: Total assistance;Sit to/from stand;+2 for safety/equipment       Functional mobility during ADLs: Maximal assistance;Rolling walker;+2 for safety/equipment General ADL Comments: Pt with decreased awareness throughout.       Vision   Additional Comments: Needs continued assessment. Scanning environment well today.      Perception     Praxis      Pertinent Vitals/Pain Pain Assessment: No/denies pain Faces Pain Scale: No hurt     Hand Dominance     Extremity/Trunk Assessment Upper Extremity Assessment Upper Extremity Assessment: RUE deficits/detail;LUE deficits/detail RUE Deficits / Details: Noted decreased proprioception, decreased coordination, and generalized weakness.  RUE Sensation: decreased proprioception RUE Coordination: decreased fine motor;decreased gross motor LUE Deficits /  Details: Decreased proprioception and coordination. Fine  motor coordination more impacted than R. Strength grossly 3+/5. Decreased ability to reach to target.  LUE Coordination: decreased fine motor;decreased gross motor   Lower Extremity Assessment Lower Extremity Assessment: Defer to PT evaluation       Communication Communication Communication: No difficulties   Cognition Arousal/Alertness: Awake/alert Behavior During Therapy: WFL for tasks assessed/performed Overall Cognitive Status: Impaired/Different from baseline Area of Impairment: Awareness;Problem solving;Safety/judgement                         Safety/Judgement: Decreased awareness of deficits;Decreased awareness of safety Awareness: Emergent Problem Solving: Slow processing General Comments: Pt with decreased awareness of her current deficits.    General Comments  VSS; sister-in-law arriving at end of session    Exercises     Shoulder Instructions      Home Living Family/patient expects to be discharged to:: Private residence Living Arrangements: Alone Available Help at Discharge: Family;Available PRN/intermittently(2 sons close by) Type of Home: House Home Access: Stairs to enter [employer] of Steps: 1   Home Layout: One level     Bathroom Shower/Tub: Chief Strategy Officer: Handicapped height     Home Equipment: Environmental consultant - 2 wheels;Cane - single point;Bedside commode;Shower seat;Wheelchair - manual          Prior Functioning/Environment Level of Independence: Independent        Comments: drove, ran errands, completed all her ADL's, but sons have started talking to her about her needing someone with her.  At this point she refuses.        OT Problem List: Decreased strength;Decreased range of motion;Decreased activity tolerance;Impaired balance (sitting and/or standing);Impaired vision/perception;Decreased cognition;Decreased safety awareness;Decreased knowledge of use of DME or AE;Decreased knowledge of  precautions;Pain      OT Treatment/Interventions: Self-care/ADL training;Therapeutic exercise;Energy conservation;DME and/or AE instruction;Therapeutic activities;Patient/family education;Balance training    OT Goals(Current goals can be found in the care plan section) Acute Rehab OT Goals Patient Stated Goal: able to do for myself at home OT Goal Formulation: With patient Time For Goal Achievement: 10/21/17 Potential to Achieve Goals: Good ADL Goals Pt Will Perform Grooming: standing;with min guard assist Pt Will Perform Upper Body Dressing: with set-up;sitting Pt Will Perform Lower Body Dressing: with min assist;sit to/from stand Pt Will Transfer to Toilet: with min assist;ambulating;bedside commode;regular height toilet(BSC over toilet) Pt Will Perform Toileting - Clothing Manipulation and hygiene: with min assist;sit to/from stand  OT Frequency: Min 2X/week   Barriers to D/C:            Co-evaluation PT/OT/SLP Co-Evaluation/Treatment: Yes Reason for Co-Treatment: Complexity of the patient's impairments (multi-system involvement);For patient/therapist safety;To address functional/ADL transfers   OT goals addressed during session: ADL's and self-care      AM-PAC PT "6 Clicks" Daily Activity     Outcome Measure Help from another person eating meals?: None Help from another person taking care of personal grooming?: A Lot Help from another person toileting, which includes using toliet, bedpan, or urinal?: A Lot Help from another person bathing (including washing, rinsing, drying)?: A Lot Help from another person to put on and taking off regular upper body clothing?: A Lot Help from another person to put on and taking off regular lower body clothing?: A Lot 6 Click Score: 14   End of Session Equipment Utilized During Treatment: Rolling walker Nurse Communication: Mobility status  Activity Tolerance: Patient tolerated treatment well Patient left: in chair;with call bell/phone  within reach;with family/visitor present  OT Visit Diagnosis: Other abnormalities of gait and mobility (R26.89);Muscle weakness (generalized) (M62.81);Hemiplegia and hemiparesis                Time: 1230-1253 OT Time Calculation (min): 23 min Charges:  OT General Charges $OT Visit: 1 Visit OT Evaluation $OT Eval Moderate Complexity: 1 [address] [place], OTR/L Acute Rehabilitation Services Pager [phone] Office [phone]   Melissa Bentley 10/07/2017, 2:05 PM

## 2017-10-07 NOTE — NC FL2 (Signed)
[place] MEDICAID FL2 LEVEL OF CARE SCREENING TOOL     IDENTIFICATION  Patient Name: Melissa Bentley Birthdate: 07/03/1927 Sex: female Admission Date (Current Location): 10/03/2017  [facility] and [place] Number:  Producer, television/film/video and Address:  [facility] [place], [address] [place], [place] [address]      Provider Number: [id number]  Attending Physician Name and Address:  Coletta Memos, MD  Relative Name and Phone Number:       Current Level of Care: Hospital Recommended Level of Care: Skilled Nursing Facility Prior Approval Number:    Date Approved/Denied:   PASRR Number:   [id number]   Discharge Plan: SNF    Current Diagnoses: Patient Active Problem List   Diagnosis Date Noted  . Pressure injury of skin 10/05/2017  . Subdural hematoma, chronic (HCC) 10/03/2017  . Diarrhea   . Diverticulosis of colon without hemorrhage   . Chronic diarrhea 01/17/2015    Orientation RESPIRATION BLADDER Height & Weight     Self, Time, Situation, Place  Normal Continent Weight: 108 lb (49 kg) Height:  5\' 1"  (154.9 cm)  BEHAVIORAL SYMPTOMS/MOOD NEUROLOGICAL BOWEL NUTRITION STATUS      Continent Diet(DIET DYS 2 Room service appropriate? Yes; Fluid consistency: Thin )  AMBULATORY STATUS COMMUNICATION OF NEEDS Skin   Extensive Assist Verbally Other (Comment)(Pressure injury stage 1 coccyx, 2 closed incision right head, closed system drian accordion 10 fr )                       Personal Care Assistance Level of Assistance  Bathing, Feeding, Dressing Bathing Assistance: Maximum assistance Feeding assistance: Independent Dressing Assistance: Maximum assistance     Functional Limitations Info             SPECIAL CARE FACTORS FREQUENCY  PT (By licensed PT), OT (By licensed OT)     PT Frequency: 5 OT Frequency: 5            Contractures      Additional Factors Info  Code Status, Allergies Code Status Info: Full Code  Allergies  Info: NKA            Current Medications (10/07/2017):  This is the current hospital active medication list Current Facility-Administered Medications  Medication Dose Route Frequency Provider Last Rate Last Dose  . 0.9 %  sodium chloride infusion   Intravenous PRN Coletta Memos, MD 10 mL/hr at 10/03/17 2219    . 0.9 % NaCl with KCl 20 mEq/ L  infusion   Intravenous Continuous Coletta Memos, MD 80 mL/hr at 10/07/17 1200    . aspirin EC tablet 81 mg  81 mg Oral Daily Coletta Memos, MD   81 mg at 10/07/17 1000  . atorvastatin (LIPITOR) tablet 40 mg  40 mg Oral q1800 Coletta Memos, MD   40 mg at 10/06/17 1818  . bisacodyl (DULCOLAX) EC tablet 5 mg  5 mg Oral Daily PRN Coletta Memos, MD      . cilostazol (PLETAL) tablet 100 mg  100 mg Oral BID Coletta Memos, MD   100 mg at 10/07/17 0959  . citalopram (CELEXA) tablet 20 mg  20 mg Oral Daily Coletta Memos, MD   20 mg at 10/07/17 0959  . dexamethasone (DECADRON) injection 10 mg  10 mg Intravenous Q6H Coletta Memos, MD   10 mg at 10/07/17 1223  . docusate sodium (COLACE) capsule 100 mg  100 mg Oral BID Coletta Memos, MD   100  mg at 10/07/17 0959  . famotidine (PEPCID) IVPB 20 mg premix  20 mg Intravenous Q12H Coletta Memos, MD   Stopped at 10/07/17 1034  . feeding supplement (BOOST / RESOURCE BREEZE) liquid 1 Container  1 Container Oral TID BM Coletta Memos, MD   1 Container at 10/07/17 1223  . HYDROcodone-acetaminophen (NORCO/VICODIN) 5-325 MG per tablet 1 tablet  1 tablet Oral Q4H PRN Coletta Memos, MD   1 tablet at 10/05/17 0837  . labetalol (NORMODYNE,TRANDATE) injection 10-40 mg  10-40 mg Intravenous Q10 min PRN Coletta Memos, MD   10 mg at 10/06/17 1206  . levETIRAcetam (KEPPRA) IVPB 500 mg/100 mL premix  500 mg Intravenous Q12H Coletta Memos, MD   Stopped at 10/07/17 [phone]  . levothyroxine (SYNTHROID, LEVOTHROID) tablet 75 mcg  75 mcg Oral QAC breakfast Coletta Memos, MD   75 mcg at 10/07/17 0819  . lisinopril (PRINIVIL,ZESTRIL) tablet 40 mg  40  mg Oral Daily Coletta Memos, MD   40 mg at 10/07/17 0959  . magnesium citrate solution 1 Bottle  1 Bottle Oral Once PRN Coletta Memos, MD      . MEDLINE mouth rinse  15 mL Mouth Rinse BID Coletta Memos, MD   15 mL at 10/06/17 2359  . metoprolol tartrate (LOPRESSOR) tablet 50 mg  50 mg Oral BID Coletta Memos, MD   50 mg at 10/07/17 1000  . morphine 2 MG/ML injection 1-2 mg  1-2 mg Intravenous Q2H PRN Coletta Memos, MD   2 mg at 10/06/17 1953  . naloxone [facility]) injection 0.08 mg  0.08 mg Intravenous PRN Coletta Memos, MD      . ondansetron (ZOFRAN) tablet 4 mg  4 mg Oral Q4H PRN Coletta Memos, MD       Or  . ondansetron (ZOFRAN) injection 4 mg  4 mg Intravenous Q4H PRN Coletta Memos, MD      . pantoprazole (PROTONIX) EC tablet 40 mg  40 mg Oral Daily Coletta Memos, MD   40 mg at 10/07/17 1000  . promethazine (PHENERGAN) tablet 12.5-25 mg  12.5-25 mg Oral Q4H PRN Coletta Memos, MD         Discharge Medications: Please see discharge summary for a list of discharge medications.  Relevant Imaging Results:  Relevant Lab Results:   Additional Information [id number]  Donnie Coffin, LCSW

## 2017-10-08 NOTE — Progress Notes (Signed)
Patient ID: Melissa Bentley, female   DOB: 1928-01-19, 84 y.o.   MRN: [id number] BP (!) 156/65   Pulse 64   Temp (!) 97.5 F (36.4 C) (Oral)   Resp (!) 21   Ht 5\' 1"  (1.549 m)   Wt 49 kg   SpO2 96%   BMI 20.41 kg/m  Alert and oriented x 4, speech is clear and fluent Moving all extremities Wound is clean, dry, no signs of infection Transfer to floor

## 2017-10-08 NOTE — Progress Notes (Addendum)
Patient has bed offer at [facility] patient and family aware and have accepted bed offer. Facility able to accept patient once medically stable.   Stacy Gardner, LCSW Clinical Social Worker  System Wide Float  [phone]

## 2017-10-09 MED ORDER — ENSURE ENLIVE PO LIQD
237.0000 mL | Freq: Two times a day (BID) | ORAL | Status: DC
  Administered 2017-10-09: 237 mL via ORAL

## 2017-10-09 MED ORDER — ADULT MULTIVITAMIN W/MINERALS CH
1.0000 | ORAL_TABLET | Freq: Every day | ORAL | Status: DC
  Administered 2017-10-09 – 2017-10-10 (×2): 1 via ORAL
  Filled 2017-10-09 (×2): qty 1

## 2017-10-09 NOTE — Consult Note (Signed)
[facility] CM Primary Care Navigator  10/09/2017  Rateel R Kohler 02/12/[phone]   Met with patient and son (W.E.) at the bedside to identify possible discharge needs.  Son states that patient had "fallen at home and hit her head really bad" that resulted to this admission/ surgery. (Craniotomy- subdural hematoma evacuation and Redo)  Patient endorsesDr.Terry Daniel with [facility] [place] asher primary care provider.    [facility] [place] [facility] [place] [facility].   Patientreportsmanagingher ownmedications at home with use of "pill box" system filled once a week.  According to son, patient has been driving prior to admission/ surgery but her 2 sons (Buddy and W.E.) or granddaughter (Jennifer) will be providing transportation to herdoctors' appointments after discharge.   Patientlives alone at home but sons and other family members will be providing care needs if patient will discharge home. Son verbalized plan to hire caregivers to assist with patient's needs at home when needed.   Anticipated discharge plan isskilled nursing facility (SNF- [facility]) for rehabilitation per therapy recommendation. Son also mentioned that family will be discussing and deciding soon about future placement for patient as appropriate.  Patientand son voiced understanding to call primary care provider's office once shegets back home,for a post discharge follow-up appointment within a1- 2 weeksor sooner if needs arise.Patient letter (with PCP's contact number) was provided as a reminder.  Discussed with patientand son regarding [facility] CM services available for health management and resourcesat homeandhadindicated interest for it.  Encouraged patientand son to talk with primary care provider on her next visit if patient returns back home, for any further needs or assistance in managing  her healthissuesat home.  Patientand son voicedunderstandingto seekreferralfrom primary care provider to [facility] care management ifdeemed necessary and appropriatefor services in the future- if patient will returnback home.  [facility] care management information was provided for future needsthatpatientmay have.  Primary care provider's office is listed as providing transition of care (TOC).   For additional questions please contact:   A. , BSN, RN-BC [facility] PRIMARY CARE Navigator Cell: [phone] 

## 2017-10-09 NOTE — Progress Notes (Signed)
Patient ID: Melissa Bentley, female   DOB: Aug 20, 1927, 84 y.o.   MRN: [id number] BP 137/65 (BP Location: Right Arm)   Pulse 61   Temp 97.9 F (36.6 C) (Oral)   Resp 18   Ht 5\' 1"  (1.549 m)   Wt 49 kg   SpO2 91%   BMI 20.41 kg/m  Alert and oriented x 4, speech is clear and fluent Moving all extremities well Will discharge tomorrow

## 2017-10-09 NOTE — Progress Notes (Signed)
Physical Therapy Treatment Patient Details Name: Melissa Bentley MRN: [id number] DOB: 08-30-1927 Today's Date: 10/09/2017    History of Present Illness Pt is an 84 y/o female with pmh significant for CAD, HTN, MI, Osteoporosis, PVD, admitted after falling several times with known SDH by CT.  Pt s/p crani for evac of SDH on 9/6 and redo crani for recurrent SDH with worsening arousal and L sided plegia 9/8.    PT Comments    Patient progressing well towards PT goals. Tolerated gait training with Mod A for balance/safety with assist for weight shifting to advance LEs. Continues to demonstrate weakness left>right and poor awareness of bil feet placement. Fatigues easily but improved distance today. Motivated to regain independence. Will follow.    Follow Up Recommendations  SNF;Supervision/Assistance - 24 hour     Equipment Recommendations  None recommended by PT    Recommendations for Other Services       Precautions / Restrictions Precautions Precautions: Fall Restrictions Weight Bearing Restrictions: No    Mobility  Bed Mobility Overal bed mobility: Needs Assistance Bed Mobility: Supine to Sit     Supine to sit: Min guard;HOB elevated     General bed mobility comments: Increased time to get to EOB, no assist needed. + dizziness.  Transfers Overall transfer level: Needs assistance Equipment used: 1 person hand held assist Transfers: Sit to/from Stand Sit to Stand: Mod assist         General transfer comment: Assist to power to standing with UE support. Stood from AllstateEOB x1, from chair x1.   Ambulation/Gait Ambulation/Gait assistance: Mod assist;+2 safety/equipment Gait Distance (Feet): 20 Feet(+ 15') Assistive device: 1 person hand held assist Gait Pattern/deviations: Step-through pattern;Step-to pattern;Shuffle;Narrow base of support;Decreased stride length Gait velocity: decreased   General Gait Details: Short uncoordinated steps with assist for weight  shifting to advance LEs; reaching for furniture for support. 1 seated rest break.   Stairs             Wheelchair Mobility    Modified Rankin (Stroke Patients Only) Modified Rankin (Stroke Patients Only) Pre-Morbid Rankin Score: No symptoms Modified Rankin: Moderately severe disability     Balance Overall balance assessment: Needs assistance Sitting-balance support: Feet supported;Single extremity supported Sitting balance-Leahy Scale: Fair     Standing balance support: During functional activity Standing balance-Leahy Scale: Poor Standing balance comment: Relies on UE support and external assistance.                             Cognition Arousal/Alertness: Awake/alert Behavior During Therapy: WFL for tasks assessed/performed Overall Cognitive Status: Impaired/Different from baseline Area of Impairment: Awareness;Problem solving                         Safety/Judgement: Decreased awareness of deficits Awareness: Emergent Problem Solving: Slow processing General Comments: Pt with decreased awareness of her current deficits.       Exercises      General Comments General comments (skin integrity, edema, etc.): Son present during session.      Pertinent Vitals/Pain Pain Assessment: Faces Faces Pain Scale: Hurts little more Pain Location: eyes and ears Pain Descriptors / Indicators: Aching;Sore Pain Intervention(s): Monitored during session;Repositioned    Home Living                      Prior Function            PT  Goals (current goals can now be found in the care plan section) Progress towards PT goals: Progressing toward goals    Frequency    Min 3X/week      PT Plan Current plan remains appropriate    Co-evaluation              AM-PAC PT "6 Clicks" Daily Activity  Outcome Measure  Difficulty turning over in bed (including adjusting bedclothes, sheets and blankets)?: None Difficulty moving from lying  on back to sitting on the side of the bed? : A Little Difficulty sitting down on and standing up from a chair with arms (e.g., wheelchair, bedside commode, etc,.)?: Unable Help needed moving to and from a bed to chair (including a wheelchair)?: A Little Help needed walking in hospital room?: A Lot Help needed climbing 3-5 steps with a railing? : A Lot 6 Click Score: 15    End of Session Equipment Utilized During Treatment: Gait belt Activity Tolerance: Patient tolerated treatment well Patient left: in chair;with call bell/phone within reach;with nursing/sitter in room;with family/visitor present Nurse Communication: Mobility status PT Visit Diagnosis: Hemiplegia and hemiparesis;Other symptoms and signs involving the nervous system (R29.898);Other abnormalities of gait and mobility (R26.89) Hemiplegia - Right/Left: Left Hemiplegia - dominant/non-dominant: Non-dominant Hemiplegia - caused by: Nontraumatic intracerebral hemorrhage     Time: 1610-9604 PT Time Calculation (min) (ACUTE ONLY): 20 min  Charges:  $Gait Training: 8-22 mins                     Mylo Red, PT, DPT Acute Rehabilitation Services Pager [phone] Office [phone]       Melissa Bentley 10/09/2017, 11:21 AM

## 2017-10-09 NOTE — Care Management Important Message (Signed)
Important Message  Patient Details  Name: Melissa Bentley MRN: [id number] Date of Birth: 11-Nov-1927   Medicare Important Message Given:  Yes    Erique Kaser Stefan ChurchBratton 10/09/2017, 3:35 PM

## 2017-10-09 NOTE — Progress Notes (Signed)
Nutrition Follow-up  DOCUMENTATION CODES:   Not applicable  INTERVENTION:   -D/c Boost Breeze po TID, each supplement provides 250 kcal and 9 grams of protein -Ensure Enlive po BID, each supplement provides 350 kcal and 20 grams of protein -MVI with minerals daily  NUTRITION DIAGNOSIS:   Increased nutrient needs related to acute illness, wound healing as evidenced by estimated needs.  Ongoing  GOAL:   Patient will meet greater than or equal to 90% of their needs  Progressing  MONITOR:   Diet advancement, PO intake, Supplement acceptance  REASON FOR ASSESSMENT:   Malnutrition Screening Tool    ASSESSMENT:   84 yo female with PMH of CAD, GERD, osteoporosis, HLD, HTN, PVD, and MI who was admitted on 9/6 for chronic subdural hematoma. S/P evacuation of SDH on admission.  9/8- s/p redo of craniotomy hematoma evacuation subdural  9/9- s/p BSE- advanced to dysphagia 2 diet with thin liquids  Reviewed I/O's: -1.6 L x 24 hours and +2.9 L since admission  Pt sleeping soundly at time of visit. Pt did not arouse when name was called. Limited meal intake documented, but noted pt consumed about 50% of Boost Breeze supplement on tray table.   Per chart review, plan to d/c to SNF once medically stable ([facility]).   Labs reviewed: CBGS: 152.   Diet Order:   Diet Order            DIET DYS 2 Room service appropriate? Yes; Fluid consistency: Thin  Diet effective now              EDUCATION NEEDS:   No education needs have been identified at this time  Skin:  Skin Assessment: Skin Integrity Issues: Skin Integrity Issues:: Stage I, Incisions Stage I: coccyx Incisions: rt head  Last BM:  10/09/17  Height:   Ht Readings from Last 1 Encounters:  10/03/17 5\' 1"  (1.549 m)    Weight:   Wt Readings from Last 1 Encounters:  10/03/17 49 kg    Ideal Body Weight:  47.7 kg  BMI:  Body mass index is 20.41 kg/m.  Estimated Nutritional  Needs:   Kcal:  1400-1600  Protein:  70-80 gm  Fluid:  1.4 L    Melissa Bentley A. Melissa Bentley, RD, LDN, CDE Pager: [phone] After hours Pager: [phone]

## 2017-10-10 DIAGNOSIS — I10 Essential (primary) hypertension: Secondary | ICD-10-CM | POA: Diagnosis not present

## 2017-10-10 DIAGNOSIS — Z48811 Encounter for surgical aftercare following surgery on the nervous system: Secondary | ICD-10-CM | POA: Diagnosis not present

## 2017-10-10 DIAGNOSIS — M6281 Muscle weakness (generalized): Secondary | ICD-10-CM | POA: Diagnosis not present

## 2017-10-10 DIAGNOSIS — Z9181 History of falling: Secondary | ICD-10-CM | POA: Diagnosis not present

## 2017-10-10 DIAGNOSIS — Z743 Need for continuous supervision: Secondary | ICD-10-CM | POA: Diagnosis not present

## 2017-10-10 DIAGNOSIS — R41841 Cognitive communication deficit: Secondary | ICD-10-CM | POA: Diagnosis not present

## 2017-10-10 DIAGNOSIS — I2510 Atherosclerotic heart disease of native coronary artery without angina pectoris: Secondary | ICD-10-CM | POA: Diagnosis not present

## 2017-10-10 DIAGNOSIS — R2689 Other abnormalities of gait and mobility: Secondary | ICD-10-CM | POA: Diagnosis not present

## 2017-10-10 DIAGNOSIS — R279 Unspecified lack of coordination: Secondary | ICD-10-CM | POA: Diagnosis not present

## 2017-10-10 DIAGNOSIS — S065X9D Traumatic subdural hemorrhage with loss of consciousness of unspecified duration, subsequent encounter: Secondary | ICD-10-CM | POA: Diagnosis not present

## 2017-10-10 DIAGNOSIS — F329 Major depressive disorder, single episode, unspecified: Secondary | ICD-10-CM | POA: Diagnosis not present

## 2017-10-10 NOTE — Clinical Social Work Note (Signed)
CSW facilitated patient discharge including contacting patient family and facility to confirm patient discharge plans. Clinical information faxed to facility and family agreeable with plan. CSW arranged [facility]. RN is currently giving report to SNF RN.  CSW will sign off for now as social work intervention is no longer needed. Please consult us again if new needs arise.  Charlynn CourtSarah Mateus Rewerts, CSW [phone]

## 2017-10-10 NOTE — Progress Notes (Signed)
[facility]. [facility]. Questions asked and answered. Facility confirms they received the discharge summary. SW confirms PTAR is set up for pt to be taken to SNF.

## 2017-10-10 NOTE — Discharge Instructions (Signed)
Craniotomy °Care After °Please read the instructions outlined below and refer to this sheet in the next few weeks. These discharge instructions provide you with general information on caring for yourself after you leave the hospital. Your surgeon may also give you specific instructions. While your treatment has been planned according to the most current medical practices available, unavoidable complications occasionally occur. If you have any problems or questions after discharge, please call your surgeon. °Although there are many types of brain surgery, recovery following craniotomy (surgical opening of the skull) is much the same for each. However, recovery depends on many factors. These include the type and severity of brain injury and the type of surgery. It also depends on any nervous system function problems (neurological deficits) before surgery. If the craniotomy was done for cancer, chemotherapy and radiation could follow. You could be in the hospital from 5 days to a couple weeks. This depends on the type of surgery, findings, and whether there are complications. °HOME CARE INSTRUCTIONS  °· It is not unusual to hear a clicking noise after a craniotomy, the plates and screws used to attach the bone flap can sometimes cause this. It is a normal occurrence if this does happen °· Do not drive for 10 days after the operation °· Your scalp may feel spongy for a while, because of fluid under it. This will gradually get better. Occasionally, the surgeon will not replace the bone that was removed to access the brain. If there is a bony defect, the surgeon will ask you to wear a helmet for protection. This is a discussion you should have with your surgeon prior to leaving the hospital (discharge). °· Numbness may persist in some areas of your scalp. °· Take all medications as directed. Sometimes steroids to control swelling are prescribed. Anticonvulsants to prevent seizures may also be given. Do not use alcohol,  other drugs, or medications unless your surgeon says it is OK. °· Keep the wound dry and clean. The wound may be washed gently with soap and water. Then, you may gently blot or dab it dry, without rubbing. Do not take baths, use swimming pools or hot tubs for 10 days, or as instructed by your caregiver. It is best to wait to see you surgeon at your first postoperative visit, and to get directions at that time. °· Only take over-the-counter or prescription medicines for pain, discomfort, or fever as directed by your caregiver. °· You may continue your normal diet, as directed. °· Walking is OK for exercise. Wait at least 3 months before you return to mild, non-contact sports or as your surgeon suggests. Contact sports should be avoided for at least 1 year, unless your surgeon says it is OK. °· If you are prescribed steroids, take them exactly as prescribed. If you start having a decrease in nervous system functions (neurological deficits) and headaches as the dose of steroids is reduced, tell your surgeon right away. °· When the anticonvulsant prescription is finished you no longer need to take it. °SEEK IMMEDIATE MEDICAL CARE IF:  °· You develop nausea, vomiting, severe headaches, confusion, or you have a seizure. °· You develop chest pain, a stiff neck, or difficulty breathing. °· There is redness, swelling, or increasing pain in the wound or pin insertion sites. °· You have an increase in swelling or bruising around the eyes. °· There is drainage or pus coming from the wound. °· You have an oral temperature above 102° F (38.9° C), not controlled by medicine. °·   You notice a foul smell coming from the wound or dressing. °· The wound breaks open (edges not staying together) after the stitches have been removed. °· You develop dizziness or fainting while standing. °· You develop a rash. °· You develop any reaction or side effects to the medications given. °Document Released: 04/16/2005 Document Revised: 04/08/2011  Document Reviewed: 01/23/2009 °ExitCare® Patient Information ©2013 [employer]. ° °

## 2017-10-10 NOTE — Progress Notes (Signed)
Nsg Discharge Note  Admit Date:  10/03/2017 Discharge date: 10/10/2017   Melissa Bentley to be D/C'd Skilled nursing facility per MD order.  AVS completed.  Copy for chart, and copy for patient signed, and dated. Patient/caregiver able to verbalize understanding.  Discharge Medication: Allergies as of 10/10/2017   No Known Allergies     Medication List    STOP taking these medications   clopidogrel 75 MG tablet Commonly known as:  PLAVIX     TAKE these medications   aspirin 81 MG tablet Take 81 mg by mouth daily. Reported on 04/27/2015   atorvastatin 40 MG tablet Commonly known as:  LIPITOR Take 40 mg by mouth daily at 6 PM.   cilostazol 100 MG tablet Commonly known as:  PLETAL Take 100 mg by mouth 2 (two) times daily.   citalopram 20 MG tablet Commonly known as:  CELEXA Take 20 mg by mouth daily.   levothyroxine 75 MCG tablet Commonly known as:  SYNTHROID, LEVOTHROID Take 75 mcg by mouth daily before breakfast.   lisinopril 40 MG tablet Commonly known as:  PRINIVIL,ZESTRIL Take 40 mg by mouth daily.   metoprolol tartrate 50 MG tablet Commonly known as:  LOPRESSOR Take 50 mg by mouth 2 (two) times daily.   pantoprazole 40 MG tablet Commonly known as:  PROTONIX Take 40 mg by mouth daily.       Discharge Assessment: Vitals:   10/10/17 0741 10/10/17 1059  BP: (!) 152/80 128/70  Pulse: 63 65  Resp: 16 17  Temp: 98.2 F (36.8 C) 98.6 F (37 C)  SpO2: 91% 94%   Skin clean, dry and intact without evidence of skin break down, no evidence of skin tears noted. IV catheter discontinued intact. Site without signs and symptoms of complications - no redness or edema noted at insertion site, patient denies c/o pain - only slight tenderness at site.  Dressing with slight pressure applied.  D/c Instructions-Education: Discharge instructions given to EMS with verbalized understanding. D/c education completed with EMS including follow up instructions, medication list,  d/c activities limitations if indicated, with other d/c instructions as indicated by MD - patient able to verbalize understanding, all questions fully answered. Patient instructed to return to ED, call 911, or call MD for any changes in condition.  Patient escorted via PTAR to SNF.   Adair LaundryElizabeth A Keiton Cosma, RN 10/10/2017 3:02 PM

## 2017-10-10 NOTE — Clinical Social Work Note (Addendum)
SNF needs discharge summary by 4:00 if discharging today. If discharging tomorrow they need it by 10:30 am. Left voicemail for MD's CMA.  Charlynn CourtSarah Daziyah Cogan, CSW [phone]  2:36 pm CSW sent discharge summary to SNF and left voicemail for admissions coordinator.  Charlynn CourtSarah Mikea Quadros, CSW [phone]

## 2017-10-10 NOTE — Discharge Summary (Signed)
  Physician Discharge Summary  Patient ID: Melissa Bentley MRN: [id number] DOB/AGE: July 27, 1927 84 y.o.  Admit date: 10/03/2017 Discharge date: 10/10/2017  Admission Diagnoses:Chronic Subdural hematoma,   Discharge Diagnoses:  Active Problems:   Subdural hematoma, chronic (HCC)   Pressure injury of skin   Discharged Condition: good  Hospital Course: Melissa Bentley was admitted and taken to the operating room for a mixed acute and chronic subdural hematoma. She had multiple falls which led to the chronic subdural, and finally a fall the day before being admitted. I took her to the operating room for a craniotomy to evacuate the hematoma. Post op she did not do well, repeat ct showed a recurrent subdural hematoma. I took her back to the OR for a redo evacuation. She did very well after the second operation. Her wound is clean, dry, no signs of infection. She is eating, and ambulating with assisting.   Treatments: right parietal CRANIOTOMY HEMATOMA EVACUATION SUBDURAL right parietal REDO CRANIOTOMY HEMATOMA EVACUATION SUBDURAL   Discharge Exam: Blood pressure 128/70, pulse 65, temperature 98.6 F (37 C), temperature source Oral, resp. rate 17, height 5\' 1"  (1.549 m), weight 49 kg, SpO2 94 %. General appearance: alert, cooperative, appears stated age and mild distress Neurologic: Grossly normal, moving all extremities. Interactive, following commands Disposition: Discharge disposition: 03-Skilled Nursing Facility      Subdural hematoma  Allergies as of 10/10/2017   No Known Allergies     Medication List    STOP taking these medications   clopidogrel 75 MG tablet Commonly known as:  PLAVIX     TAKE these medications   aspirin 81 MG tablet Take 81 mg by mouth daily. Reported on 04/27/2015   atorvastatin 40 MG tablet Commonly known as:  LIPITOR Take 40 mg by mouth daily at 6 PM.   cilostazol 100 MG tablet Commonly known as:  PLETAL Take 100 mg by mouth 2 (two) times  daily.   citalopram 20 MG tablet Commonly known as:  CELEXA Take 20 mg by mouth daily.   levothyroxine 75 MCG tablet Commonly known as:  SYNTHROID, LEVOTHROID Take 75 mcg by mouth daily before breakfast.   lisinopril 40 MG tablet Commonly known as:  PRINIVIL,ZESTRIL Take 40 mg by mouth daily.   metoprolol tartrate 50 MG tablet Commonly known as:  LOPRESSOR Take 50 mg by mouth 2 (two) times daily.   pantoprazole 40 MG tablet Commonly known as:  PROTONIX Take 40 mg by mouth daily.       Contact information for follow-up providers    Coletta Memosabbell, Isahia Hollerbach, MD Follow up in 3 week(s).   Specialty:  Neurosurgery Why:  please call to make an appointment, staples should be removed by staff in one week at the rehab facility Contact information: [address] [place] [address] [phone]            Contact information for after-discharge care    Destination    [facility] Preferred SNF .   Service:  Skilled Nursing Contact information: [address] [place] [id number] [phone]                  Signed: Carmela HurtCABBELL,Curvin Hunger L 10/10/2017, 2:18 PM

## 2017-10-10 NOTE — Clinical Social Work Placement (Signed)
   CLINICAL SOCIAL WORK PLACEMENT  NOTE  Date:  10/10/2017  Patient Details  Name: Melissa Bentley MRN: [id number] Date of Birth: January 30, 1927  Clinical Social Work is seeking post-discharge placement for this patient at the Skilled  Nursing Facility level of care (*CSW will initial, date and re-position this form in  chart as items are completed):      Patient/family provided with [facility] Clinical Social Work Department's list of facilities offering this level of care within the geographic area requested by the patient (or if unable, by the patient's family).      Patient/family informed of their freedom to choose among providers that offer the needed level of care, that participate in Medicare, Medicaid or managed care program needed by the patient, have an available bed and are willing to accept the patient.      Patient/family informed of [facility] ownership interest in [facility] and [facility], as well as of the fact that they are under no obligation to receive care at these facilities.  PASRR submitted to [employer] on 10/07/17     PASRR number received on 10/07/17     Existing PASRR number confirmed on       FL2 transmitted to all facilities in geographic area requested by pt/family on 10/07/17     FL2 transmitted to all facilities within larger geographic area on       Patient informed that his/her managed care company has contracts with or will negotiate with certain facilities, including the following:        Yes   Patient/family informed of bed offers received.  Patient chooses bed at [facility]     Physician recommends and patient chooses bed at      Patient to be transferred to [facility] on 10/10/17.  Patient to be transferred to facility by PTAR     Patient family notified on 10/10/17 of transfer.  Name of family member notified:  Son at bedside     PHYSICIAN Please prepare prescriptions     Additional Comment:     _______________________________________________ Margarito LinerSarah C Syreeta Figler, LCSW 10/10/2017, 3:03 PM

## 2017-10-12 DIAGNOSIS — S065X9D Traumatic subdural hemorrhage with loss of consciousness of unspecified duration, subsequent encounter: Secondary | ICD-10-CM | POA: Diagnosis not present

## 2017-10-25 DIAGNOSIS — S065X9D Traumatic subdural hemorrhage with loss of consciousness of unspecified duration, subsequent encounter: Secondary | ICD-10-CM | POA: Diagnosis not present

## 2017-10-28 DIAGNOSIS — R41841 Cognitive communication deficit: Secondary | ICD-10-CM | POA: Diagnosis not present

## 2017-10-28 DIAGNOSIS — I10 Essential (primary) hypertension: Secondary | ICD-10-CM | POA: Diagnosis not present

## 2017-10-28 DIAGNOSIS — S065X9D Traumatic subdural hemorrhage with loss of consciousness of unspecified duration, subsequent encounter: Secondary | ICD-10-CM | POA: Diagnosis not present

## 2017-10-28 DIAGNOSIS — M6281 Muscle weakness (generalized): Secondary | ICD-10-CM | POA: Diagnosis not present

## 2017-10-28 DIAGNOSIS — R2689 Other abnormalities of gait and mobility: Secondary | ICD-10-CM | POA: Diagnosis not present

## 2017-10-28 DIAGNOSIS — I2510 Atherosclerotic heart disease of native coronary artery without angina pectoris: Secondary | ICD-10-CM | POA: Diagnosis not present

## 2017-10-28 DIAGNOSIS — Z48811 Encounter for surgical aftercare following surgery on the nervous system: Secondary | ICD-10-CM | POA: Diagnosis not present

## 2017-10-28 DIAGNOSIS — F329 Major depressive disorder, single episode, unspecified: Secondary | ICD-10-CM | POA: Diagnosis not present

## 2017-10-28 DIAGNOSIS — Z9181 History of falling: Secondary | ICD-10-CM | POA: Diagnosis not present

## 2017-11-05 ENCOUNTER — Ambulatory Visit: Payer: Medicare Other | Admitting: Cardiovascular Disease

## 2017-11-06 ENCOUNTER — Encounter: Payer: Self-pay | Admitting: Cardiovascular Disease

## 2017-11-12 DIAGNOSIS — Z9181 History of falling: Secondary | ICD-10-CM | POA: Diagnosis not present

## 2017-11-12 DIAGNOSIS — Z7982 Long term (current) use of aspirin: Secondary | ICD-10-CM | POA: Diagnosis not present

## 2017-11-12 DIAGNOSIS — G8194 Hemiplegia, unspecified affecting left nondominant side: Secondary | ICD-10-CM | POA: Diagnosis not present

## 2017-11-12 DIAGNOSIS — W19XXXD Unspecified fall, subsequent encounter: Secondary | ICD-10-CM | POA: Diagnosis not present

## 2017-11-12 DIAGNOSIS — F39 Unspecified mood [affective] disorder: Secondary | ICD-10-CM | POA: Diagnosis not present

## 2017-11-12 DIAGNOSIS — I10 Essential (primary) hypertension: Secondary | ICD-10-CM | POA: Diagnosis not present

## 2017-11-12 DIAGNOSIS — I2510 Atherosclerotic heart disease of native coronary artery without angina pectoris: Secondary | ICD-10-CM | POA: Diagnosis not present

## 2017-11-12 DIAGNOSIS — S065X9D Traumatic subdural hemorrhage with loss of consciousness of unspecified duration, subsequent encounter: Secondary | ICD-10-CM | POA: Diagnosis not present

## 2017-11-12 DIAGNOSIS — Z955 Presence of coronary angioplasty implant and graft: Secondary | ICD-10-CM | POA: Diagnosis not present

## 2017-11-12 DIAGNOSIS — Z7902 Long term (current) use of antithrombotics/antiplatelets: Secondary | ICD-10-CM | POA: Diagnosis not present

## 2017-11-12 DIAGNOSIS — Z79899 Other long term (current) drug therapy: Secondary | ICD-10-CM | POA: Diagnosis not present

## 2017-11-14 DIAGNOSIS — S065X9D Traumatic subdural hemorrhage with loss of consciousness of unspecified duration, subsequent encounter: Secondary | ICD-10-CM | POA: Diagnosis not present

## 2017-11-14 DIAGNOSIS — W19XXXD Unspecified fall, subsequent encounter: Secondary | ICD-10-CM | POA: Diagnosis not present

## 2017-11-14 DIAGNOSIS — I10 Essential (primary) hypertension: Secondary | ICD-10-CM | POA: Diagnosis not present

## 2017-11-14 DIAGNOSIS — G8194 Hemiplegia, unspecified affecting left nondominant side: Secondary | ICD-10-CM | POA: Diagnosis not present

## 2017-11-14 DIAGNOSIS — I2510 Atherosclerotic heart disease of native coronary artery without angina pectoris: Secondary | ICD-10-CM | POA: Diagnosis not present

## 2017-11-14 DIAGNOSIS — F39 Unspecified mood [affective] disorder: Secondary | ICD-10-CM | POA: Diagnosis not present

## 2017-11-17 DIAGNOSIS — I2510 Atherosclerotic heart disease of native coronary artery without angina pectoris: Secondary | ICD-10-CM | POA: Diagnosis not present

## 2017-11-17 DIAGNOSIS — S065X9D Traumatic subdural hemorrhage with loss of consciousness of unspecified duration, subsequent encounter: Secondary | ICD-10-CM | POA: Diagnosis not present

## 2017-11-17 DIAGNOSIS — G8194 Hemiplegia, unspecified affecting left nondominant side: Secondary | ICD-10-CM | POA: Diagnosis not present

## 2017-11-17 DIAGNOSIS — I10 Essential (primary) hypertension: Secondary | ICD-10-CM | POA: Diagnosis not present

## 2017-11-17 DIAGNOSIS — F39 Unspecified mood [affective] disorder: Secondary | ICD-10-CM | POA: Diagnosis not present

## 2017-11-17 DIAGNOSIS — W19XXXD Unspecified fall, subsequent encounter: Secondary | ICD-10-CM | POA: Diagnosis not present

## 2017-11-18 DIAGNOSIS — W19XXXD Unspecified fall, subsequent encounter: Secondary | ICD-10-CM | POA: Diagnosis not present

## 2017-11-18 DIAGNOSIS — S065X9D Traumatic subdural hemorrhage with loss of consciousness of unspecified duration, subsequent encounter: Secondary | ICD-10-CM | POA: Diagnosis not present

## 2017-11-18 DIAGNOSIS — F39 Unspecified mood [affective] disorder: Secondary | ICD-10-CM | POA: Diagnosis not present

## 2017-11-18 DIAGNOSIS — I10 Essential (primary) hypertension: Secondary | ICD-10-CM | POA: Diagnosis not present

## 2017-11-18 DIAGNOSIS — I2510 Atherosclerotic heart disease of native coronary artery without angina pectoris: Secondary | ICD-10-CM | POA: Diagnosis not present

## 2017-11-18 DIAGNOSIS — G8194 Hemiplegia, unspecified affecting left nondominant side: Secondary | ICD-10-CM | POA: Diagnosis not present

## 2017-11-19 DIAGNOSIS — I10 Essential (primary) hypertension: Secondary | ICD-10-CM | POA: Diagnosis not present

## 2017-11-19 DIAGNOSIS — I2510 Atherosclerotic heart disease of native coronary artery without angina pectoris: Secondary | ICD-10-CM | POA: Diagnosis not present

## 2017-11-19 DIAGNOSIS — G8194 Hemiplegia, unspecified affecting left nondominant side: Secondary | ICD-10-CM | POA: Diagnosis not present

## 2017-11-19 DIAGNOSIS — S065X9D Traumatic subdural hemorrhage with loss of consciousness of unspecified duration, subsequent encounter: Secondary | ICD-10-CM | POA: Diagnosis not present

## 2017-11-19 DIAGNOSIS — F39 Unspecified mood [affective] disorder: Secondary | ICD-10-CM | POA: Diagnosis not present

## 2017-11-19 DIAGNOSIS — W19XXXD Unspecified fall, subsequent encounter: Secondary | ICD-10-CM | POA: Diagnosis not present

## 2017-11-20 DIAGNOSIS — F39 Unspecified mood [affective] disorder: Secondary | ICD-10-CM | POA: Diagnosis not present

## 2017-11-20 DIAGNOSIS — I2510 Atherosclerotic heart disease of native coronary artery without angina pectoris: Secondary | ICD-10-CM | POA: Diagnosis not present

## 2017-11-20 DIAGNOSIS — I10 Essential (primary) hypertension: Secondary | ICD-10-CM | POA: Diagnosis not present

## 2017-11-20 DIAGNOSIS — W19XXXD Unspecified fall, subsequent encounter: Secondary | ICD-10-CM | POA: Diagnosis not present

## 2017-11-20 DIAGNOSIS — S065X9D Traumatic subdural hemorrhage with loss of consciousness of unspecified duration, subsequent encounter: Secondary | ICD-10-CM | POA: Diagnosis not present

## 2017-11-20 DIAGNOSIS — G8194 Hemiplegia, unspecified affecting left nondominant side: Secondary | ICD-10-CM | POA: Diagnosis not present

## 2017-11-21 DIAGNOSIS — I10 Essential (primary) hypertension: Secondary | ICD-10-CM | POA: Diagnosis not present

## 2017-11-21 DIAGNOSIS — I2510 Atherosclerotic heart disease of native coronary artery without angina pectoris: Secondary | ICD-10-CM | POA: Diagnosis not present

## 2017-11-21 DIAGNOSIS — S065X9D Traumatic subdural hemorrhage with loss of consciousness of unspecified duration, subsequent encounter: Secondary | ICD-10-CM | POA: Diagnosis not present

## 2017-11-21 DIAGNOSIS — G8194 Hemiplegia, unspecified affecting left nondominant side: Secondary | ICD-10-CM | POA: Diagnosis not present

## 2017-11-21 DIAGNOSIS — F39 Unspecified mood [affective] disorder: Secondary | ICD-10-CM | POA: Diagnosis not present

## 2017-11-21 DIAGNOSIS — W19XXXD Unspecified fall, subsequent encounter: Secondary | ICD-10-CM | POA: Diagnosis not present

## 2017-11-24 DIAGNOSIS — F39 Unspecified mood [affective] disorder: Secondary | ICD-10-CM | POA: Diagnosis not present

## 2017-11-24 DIAGNOSIS — S065X9D Traumatic subdural hemorrhage with loss of consciousness of unspecified duration, subsequent encounter: Secondary | ICD-10-CM | POA: Diagnosis not present

## 2017-11-24 DIAGNOSIS — I10 Essential (primary) hypertension: Secondary | ICD-10-CM | POA: Diagnosis not present

## 2017-11-24 DIAGNOSIS — G8194 Hemiplegia, unspecified affecting left nondominant side: Secondary | ICD-10-CM | POA: Diagnosis not present

## 2017-11-24 DIAGNOSIS — I2510 Atherosclerotic heart disease of native coronary artery without angina pectoris: Secondary | ICD-10-CM | POA: Diagnosis not present

## 2017-11-24 DIAGNOSIS — W19XXXD Unspecified fall, subsequent encounter: Secondary | ICD-10-CM | POA: Diagnosis not present

## 2017-11-26 DIAGNOSIS — W19XXXD Unspecified fall, subsequent encounter: Secondary | ICD-10-CM | POA: Diagnosis not present

## 2017-11-26 DIAGNOSIS — I2510 Atherosclerotic heart disease of native coronary artery without angina pectoris: Secondary | ICD-10-CM | POA: Diagnosis not present

## 2017-11-26 DIAGNOSIS — G8194 Hemiplegia, unspecified affecting left nondominant side: Secondary | ICD-10-CM | POA: Diagnosis not present

## 2017-11-26 DIAGNOSIS — F39 Unspecified mood [affective] disorder: Secondary | ICD-10-CM | POA: Diagnosis not present

## 2017-11-26 DIAGNOSIS — I10 Essential (primary) hypertension: Secondary | ICD-10-CM | POA: Diagnosis not present

## 2017-11-26 DIAGNOSIS — S065X9D Traumatic subdural hemorrhage with loss of consciousness of unspecified duration, subsequent encounter: Secondary | ICD-10-CM | POA: Diagnosis not present

## 2017-11-27 DIAGNOSIS — G8194 Hemiplegia, unspecified affecting left nondominant side: Secondary | ICD-10-CM | POA: Diagnosis not present

## 2017-11-27 DIAGNOSIS — I2510 Atherosclerotic heart disease of native coronary artery without angina pectoris: Secondary | ICD-10-CM | POA: Diagnosis not present

## 2017-11-27 DIAGNOSIS — W19XXXD Unspecified fall, subsequent encounter: Secondary | ICD-10-CM | POA: Diagnosis not present

## 2017-11-27 DIAGNOSIS — S065X9D Traumatic subdural hemorrhage with loss of consciousness of unspecified duration, subsequent encounter: Secondary | ICD-10-CM | POA: Diagnosis not present

## 2017-11-27 DIAGNOSIS — F39 Unspecified mood [affective] disorder: Secondary | ICD-10-CM | POA: Diagnosis not present

## 2017-11-27 DIAGNOSIS — I10 Essential (primary) hypertension: Secondary | ICD-10-CM | POA: Diagnosis not present

## 2017-12-11 DIAGNOSIS — G8194 Hemiplegia, unspecified affecting left nondominant side: Secondary | ICD-10-CM | POA: Diagnosis not present

## 2017-12-11 DIAGNOSIS — F39 Unspecified mood [affective] disorder: Secondary | ICD-10-CM | POA: Diagnosis not present

## 2017-12-11 DIAGNOSIS — I10 Essential (primary) hypertension: Secondary | ICD-10-CM | POA: Diagnosis not present

## 2017-12-11 DIAGNOSIS — S065X9D Traumatic subdural hemorrhage with loss of consciousness of unspecified duration, subsequent encounter: Secondary | ICD-10-CM | POA: Diagnosis not present

## 2017-12-11 DIAGNOSIS — S065X9A Traumatic subdural hemorrhage with loss of consciousness of unspecified duration, initial encounter: Secondary | ICD-10-CM | POA: Diagnosis not present

## 2017-12-11 DIAGNOSIS — W19XXXD Unspecified fall, subsequent encounter: Secondary | ICD-10-CM | POA: Diagnosis not present

## 2017-12-11 DIAGNOSIS — I2510 Atherosclerotic heart disease of native coronary artery without angina pectoris: Secondary | ICD-10-CM | POA: Diagnosis not present

## 2017-12-16 ENCOUNTER — Ambulatory Visit: Payer: Medicare Other | Admitting: Cardiovascular Disease

## 2017-12-24 DIAGNOSIS — G8194 Hemiplegia, unspecified affecting left nondominant side: Secondary | ICD-10-CM | POA: Diagnosis not present

## 2017-12-24 DIAGNOSIS — I10 Essential (primary) hypertension: Secondary | ICD-10-CM | POA: Diagnosis not present

## 2017-12-24 DIAGNOSIS — I2510 Atherosclerotic heart disease of native coronary artery without angina pectoris: Secondary | ICD-10-CM | POA: Diagnosis not present

## 2017-12-24 DIAGNOSIS — F39 Unspecified mood [affective] disorder: Secondary | ICD-10-CM | POA: Diagnosis not present

## 2017-12-24 DIAGNOSIS — S065X9D Traumatic subdural hemorrhage with loss of consciousness of unspecified duration, subsequent encounter: Secondary | ICD-10-CM | POA: Diagnosis not present

## 2017-12-24 DIAGNOSIS — W19XXXD Unspecified fall, subsequent encounter: Secondary | ICD-10-CM | POA: Diagnosis not present

## 2017-12-31 DIAGNOSIS — I10 Essential (primary) hypertension: Secondary | ICD-10-CM | POA: Diagnosis not present

## 2017-12-31 DIAGNOSIS — K219 Gastro-esophageal reflux disease without esophagitis: Secondary | ICD-10-CM | POA: Diagnosis not present

## 2017-12-31 DIAGNOSIS — R42 Dizziness and giddiness: Secondary | ICD-10-CM | POA: Diagnosis not present

## 2017-12-31 DIAGNOSIS — E039 Hypothyroidism, unspecified: Secondary | ICD-10-CM | POA: Diagnosis not present

## 2017-12-31 DIAGNOSIS — E782 Mixed hyperlipidemia: Secondary | ICD-10-CM | POA: Diagnosis not present

## 2018-01-01 DIAGNOSIS — R42 Dizziness and giddiness: Secondary | ICD-10-CM | POA: Diagnosis not present

## 2018-01-01 DIAGNOSIS — E039 Hypothyroidism, unspecified: Secondary | ICD-10-CM | POA: Diagnosis not present

## 2018-01-01 DIAGNOSIS — E782 Mixed hyperlipidemia: Secondary | ICD-10-CM | POA: Diagnosis not present

## 2018-01-01 DIAGNOSIS — I10 Essential (primary) hypertension: Secondary | ICD-10-CM | POA: Diagnosis not present

## 2018-01-01 DIAGNOSIS — K210 Gastro-esophageal reflux disease with esophagitis: Secondary | ICD-10-CM | POA: Diagnosis not present

## 2018-01-07 DIAGNOSIS — Z681 Body mass index (BMI) 19 or less, adult: Secondary | ICD-10-CM | POA: Diagnosis not present

## 2018-01-07 DIAGNOSIS — I10 Essential (primary) hypertension: Secondary | ICD-10-CM | POA: Diagnosis not present

## 2018-01-07 DIAGNOSIS — R42 Dizziness and giddiness: Secondary | ICD-10-CM | POA: Diagnosis not present

## 2018-01-07 DIAGNOSIS — R911 Solitary pulmonary nodule: Secondary | ICD-10-CM | POA: Diagnosis not present

## 2018-01-07 DIAGNOSIS — I951 Orthostatic hypotension: Secondary | ICD-10-CM | POA: Diagnosis not present

## 2018-01-07 DIAGNOSIS — I2510 Atherosclerotic heart disease of native coronary artery without angina pectoris: Secondary | ICD-10-CM | POA: Diagnosis not present

## 2018-01-07 DIAGNOSIS — G8194 Hemiplegia, unspecified affecting left nondominant side: Secondary | ICD-10-CM | POA: Diagnosis not present

## 2018-01-07 DIAGNOSIS — F39 Unspecified mood [affective] disorder: Secondary | ICD-10-CM | POA: Diagnosis not present

## 2018-01-07 DIAGNOSIS — L400 Psoriasis vulgaris: Secondary | ICD-10-CM | POA: Diagnosis not present

## 2018-01-07 DIAGNOSIS — I739 Peripheral vascular disease, unspecified: Secondary | ICD-10-CM | POA: Diagnosis not present

## 2018-01-07 DIAGNOSIS — W19XXXD Unspecified fall, subsequent encounter: Secondary | ICD-10-CM | POA: Diagnosis not present

## 2018-01-07 DIAGNOSIS — S065X9D Traumatic subdural hemorrhage with loss of consciousness of unspecified duration, subsequent encounter: Secondary | ICD-10-CM | POA: Diagnosis not present

## 2018-01-14 DIAGNOSIS — F39 Unspecified mood [affective] disorder: Secondary | ICD-10-CM | POA: Diagnosis not present

## 2018-01-14 DIAGNOSIS — G8194 Hemiplegia, unspecified affecting left nondominant side: Secondary | ICD-10-CM | POA: Diagnosis not present

## 2018-01-14 DIAGNOSIS — I2510 Atherosclerotic heart disease of native coronary artery without angina pectoris: Secondary | ICD-10-CM | POA: Diagnosis not present

## 2018-01-14 DIAGNOSIS — I10 Essential (primary) hypertension: Secondary | ICD-10-CM | POA: Diagnosis not present

## 2018-01-14 DIAGNOSIS — S065X9D Traumatic subdural hemorrhage with loss of consciousness of unspecified duration, subsequent encounter: Secondary | ICD-10-CM | POA: Diagnosis not present

## 2018-01-15 DIAGNOSIS — Z681 Body mass index (BMI) 19 or less, adult: Secondary | ICD-10-CM | POA: Diagnosis not present

## 2018-01-15 DIAGNOSIS — Z0001 Encounter for general adult medical examination with abnormal findings: Secondary | ICD-10-CM | POA: Diagnosis not present

## 2018-01-15 DIAGNOSIS — R296 Repeated falls: Secondary | ICD-10-CM | POA: Diagnosis not present

## 2018-01-26 ENCOUNTER — Encounter: Payer: Self-pay | Admitting: Cardiovascular Disease

## 2018-01-26 ENCOUNTER — Ambulatory Visit (INDEPENDENT_AMBULATORY_CARE_PROVIDER_SITE_OTHER): Payer: Medicare Other | Admitting: Cardiovascular Disease

## 2018-01-26 ENCOUNTER — Telehealth: Payer: Self-pay | Admitting: Cardiovascular Disease

## 2018-01-26 VITALS — BP 120/62 | HR 59 | Ht 60.0 in | Wt 108.0 lb

## 2018-01-26 DIAGNOSIS — R0989 Other specified symptoms and signs involving the circulatory and respiratory systems: Secondary | ICD-10-CM | POA: Diagnosis not present

## 2018-01-26 DIAGNOSIS — I10 Essential (primary) hypertension: Secondary | ICD-10-CM

## 2018-01-26 DIAGNOSIS — R55 Syncope and collapse: Secondary | ICD-10-CM | POA: Diagnosis not present

## 2018-01-26 DIAGNOSIS — I25118 Atherosclerotic heart disease of native coronary artery with other forms of angina pectoris: Secondary | ICD-10-CM | POA: Diagnosis not present

## 2018-01-26 DIAGNOSIS — I739 Peripheral vascular disease, unspecified: Secondary | ICD-10-CM

## 2018-01-26 NOTE — Telephone Encounter (Signed)
°  Precert needed for: 30 day monitor   Location:     Date:

## 2018-01-26 NOTE — Patient Instructions (Signed)
Medication Instructions:  Your physician recommends that you continue on your current medications as directed. Please refer to the Current Medication list given to you today.   Labwork: none  Testing/Procedures: Your physician has recommended that you wear an event monitor. Event monitors are medical devices that record the heart's electrical activity. Doctors most often us these monitors to diagnose arrhythmias. Arrhythmias are problems with the speed or rhythm of the heartbeat. The monitor is a small, portable device. You can wear one while you do your normal daily activities. This is usually used to diagnose what is causing palpitations/syncope (passing out).  Your physician has requested that you have a lexiscan myoview. For further information please visit [email or web address]. Please follow instruction sheet, as given.  Your physician has requested that you have a carotid duplex. This test is an ultrasound of the carotid arteries in your neck. It looks at blood flow through these arteries that supply the brain with blood. Allow one hour for this exam. There are no restrictions or special instructions.    Follow-Up: Your physician recommends that you schedule a follow-up appointment in: 3 months    Any Other Special Instructions Will Be Listed Below (If Applicable).     If you need a refill on your cardiac medications before your next appointment, please call your pharmacy.

## 2018-01-26 NOTE — Telephone Encounter (Signed)
° ° °  Precert needed for: Carotid Doppler   Location:  [facility]     Date: Jan 29, 2018

## 2018-01-26 NOTE — Progress Notes (Signed)
CARDIOLOGY CONSULT NOTE  Patient ID: Melissa Bentley MRN: [id number] DOB/AGE: 1927/12/23 84 y.o.  Admit date: (Not on file) Primary Physician: Richardean Chimera, MD Referring Physician: Richardean Chimera, MD   Reason for Consultation: Syncope  HPI: Melissa Bentley is a 84 y.o. female who is being seen today for the evaluation of syncope at the request of Richardean Chimera, MD.   Past medical history includes multiple falls and chronic subdural hematoma.  She underwent evacuation for this most recently in September 2019.  Past medical history also includes coronary artery disease and subclavian artery stent.  I reviewed notes from her PCP from an office visit dated 09/24/2017.  At that time she apparently fell out of her bed and hit her face and was sweating profusely and complained of an upset stomach.  I personally reviewed an EKG performed at that office visit which demonstrated sinus bradycardia and mild nonspecific T wave abnormalities.  She is here with 1 of her sons.  It appears she has a history of multiple falls with possible loss of consciousness.  It is unclear if she had frank syncope or near syncope.  Some of these episodes were unwitnessed.  She denies exertional chest pain.  She had chest pain at the time of an MI in 2001 and had a coronary artery stent.  She also has a right subclavian artery stent.  She seldom has shortness of breath.  She does have pain in her legs when she walks above her knees and up to her hips.  She denies leg swelling, palpitations, orthopnea, and paroxysmal nocturnal dyspnea.  At the time of her most recent near syncopal/syncopal episode, she was standing at the counter sending out her medications.  She then experienced a "funny sensation across her chest "and then felt sick to her stomach and knew she was going to pass out.  ECG performed in the office today which I ordered and personally interpreted demonstrates normal sinus rhythm  with PACs.   No Known Allergies  Current Outpatient Medications  Medication Sig Dispense Refill  . aspirin 81 MG tablet Take 81 mg by mouth daily. Reported on 04/27/2015    . atorvastatin (LIPITOR) 40 MG tablet Take 40 mg by mouth daily at 6 PM.     . cilostazol (PLETAL) 100 MG tablet Take 100 mg by mouth 2 (two) times daily.     . citalopram (CELEXA) 20 MG tablet Take 20 mg by mouth daily.     Marland Kitchen levothyroxine (SYNTHROID, LEVOTHROID) 75 MCG tablet Take 75 mcg by mouth daily before breakfast.     . lisinopril (PRINIVIL,ZESTRIL) 40 MG tablet Take 40 mg by mouth daily.     . metoprolol (LOPRESSOR) 50 MG tablet Take 50 mg by mouth 2 (two) times daily.     . pantoprazole (PROTONIX) 40 MG tablet Take 40 mg by mouth daily.     No current facility-administered medications for this visit.     Past Medical History:  Diagnosis Date  . CAD (coronary artery disease)   . Depression   . GERD (gastroesophageal reflux disease)   . Hyperlipidemia   . Hypertension   . Hypothyroidism   . MI (myocardial infarction) (HCC)   . Osteoporosis   . Psoriasis   . PVD (peripheral vascular disease) (HCC)     Past Surgical History:  Procedure Laterality Date  . ABDOMINAL HYSTERECTOMY    . COLONOSCOPY N/A 02/01/2015   Dr. Rourk:colonic diverticulosis s/p  biopsy with unremarkable colonic mucosa, no microscopic colitis.   . CORONARY ANGIOPLASTY WITH STENT PLACEMENT    . CRANIOTOMY Right 10/03/2017   Procedure: CRANIOTOMY HEMATOMA EVACUATION SUBDURAL;  Surgeon: Coletta Memosabbell, Kyle, MD;  Location: [facility] OR;  Service: Neurosurgery;  Laterality: Right;  . CRANIOTOMY Right 10/05/2017   Procedure: REDO CRANIOTOMY HEMATOMA EVACUATION SUBDURAL;  Surgeon: Coletta Memosabbell, Kyle, MD;  Location: [facility] OR;  Service: Neurosurgery;  Laterality: Right;  . LUNG LOBECTOMY    . SUBCLAVIAN ARTERY STENT    . TUBAL LIGATION      Social History   Socioeconomic History  . Marital status: Widowed    Spouse name: Not on file  . Number of children:  Not on file  . Years of education: Not on file  . Highest education level: Not on file  Occupational History  . Not on file  Social Needs  . Financial resource strain: Not on file  . Food insecurity:    Worry: Not on file    Inability: Not on file  . Transportation needs:    Medical: Not on file    Non-medical: Not on file  Tobacco Use  . Smoking status: Former Smoker    Last attempt to quit: 01/17/1996    Years since quitting: 22.0  . Smokeless tobacco: Never Used  Substance and Sexual Activity  . Alcohol use: No    Alcohol/week: 0.0 standard drinks  . Drug use: No  . Sexual activity: Not on file  Lifestyle  . Physical activity:    Days per week: Not on file    Minutes per session: Not on file  . Stress: Not on file  Relationships  . Social connections:    Talks on phone: Not on file    Gets together: Not on file    Attends religious service: Not on file    Active member of club or organization: Not on file    Attends meetings of clubs or organizations: Not on file    Relationship status: Not on file  . Intimate partner violence:    Fear of current or ex partner: Not on file    Emotionally abused: Not on file    Physically abused: Not on file    Forced sexual activity: Not on file  Other Topics Concern  . Not on file  Social History Narrative  . Not on file     No family history of premature CAD in 1st degree relatives.  Current Meds  Medication Sig  . aspirin 81 MG tablet Take 81 mg by mouth daily. Reported on 04/27/2015  . atorvastatin (LIPITOR) 40 MG tablet Take 40 mg by mouth daily at 6 PM.   . cilostazol (PLETAL) 100 MG tablet Take 100 mg by mouth 2 (two) times daily.   . citalopram (CELEXA) 20 MG tablet Take 20 mg by mouth daily.   Marland Kitchen. levothyroxine (SYNTHROID, LEVOTHROID) 75 MCG tablet Take 75 mcg by mouth daily before breakfast.   . lisinopril (PRINIVIL,ZESTRIL) 40 MG tablet Take 40 mg by mouth daily.   . metoprolol (LOPRESSOR) 50 MG tablet Take 50 mg  by mouth 2 (two) times daily.   . pantoprazole (PROTONIX) 40 MG tablet Take 40 mg by mouth daily.      Review of systems complete and found to be negative unless listed above in HPI    Physical exam Blood pressure 120/62, pulse (!) 59, height 5' (1.524 m), weight 108 lb (49 kg), SpO2 94 %. General: NAD Neck: No JVD, no  thyromegaly or thyroid nodule.  Lungs: Clear to auscultation bilaterally with normal respiratory effort. CV: Nondisplaced PMI. Regular rate and rhythm with occasional premature contractions, normal S1/S2, no S3/S4, no murmur.  No peripheral edema.  Right carotid bruit.   Abdomen: Soft, nontender, no distention.  Skin: Intact without lesions or rashes.  Neurologic: Alert and oriented x 3.  Psych: Normal affect. Extremities: No clubbing or cyanosis.  HEENT: Normal.   ECG: Most recent ECG reviewed.   Labs: Lab Results  Component Value Date/Time   K 4.0 10/05/2017 01:25 PM   HGB 8.2 (L) 10/05/2017 01:25 PM     Lipids: No results found for: LDLCALC, LDLDIRECT, CHOL, TRIG, HDL      ASSESSMENT AND PLAN:  1.  Syncope: Unclear if she had frank syncopal or near syncopal episodes.  Prodrome prescription suggest a possible vasovagal etiology.  However, given her history of coronary artery disease, ischemia will need to be ruled out.  I will obtain a Lexiscan Myoview stress test.  I will also obtain a 30-day event monitor.  2.  Coronary artery disease: History of coronary artery stent in 2001 at the time of MI.  I will obtain a Lexiscan Myoview stress test given her multiple episodes of near syncope/syncope.  Currently on aspirin, atorvastatin, and Lopressor.  3.  Right carotid bruit: I will obtain carotid Dopplers.  4.  Hypertension: Controlled on present therapy.  No changes.  5.  Peripheral vascular disease: History of right subclavian artery stent.  Asymptomatic from this standpoint.  Continue aspirin and statin.   Disposition: Follow up in 3  months  Signed: Prentice DockerSuresh Emonii Wienke, M.D., F.A.C.C.  01/26/2018, 8:45 AM

## 2018-01-26 NOTE — Telephone Encounter (Signed)
°  Precert needed for: Lexiscan  [facility]     Date: Feb 06, 2018

## 2018-01-29 ENCOUNTER — Ambulatory Visit (INDEPENDENT_AMBULATORY_CARE_PROVIDER_SITE_OTHER): Payer: Medicare Other

## 2018-01-29 DIAGNOSIS — R0989 Other specified symptoms and signs involving the circulatory and respiratory systems: Secondary | ICD-10-CM | POA: Diagnosis not present

## 2018-01-30 ENCOUNTER — Telehealth: Payer: Self-pay | Admitting: *Deleted

## 2018-01-30 ENCOUNTER — Ambulatory Visit (INDEPENDENT_AMBULATORY_CARE_PROVIDER_SITE_OTHER): Payer: Medicare Other

## 2018-01-30 DIAGNOSIS — R55 Syncope and collapse: Secondary | ICD-10-CM

## 2018-01-30 NOTE — Telephone Encounter (Signed)
Notes recorded by Lesle Chris, LPN on 06/29/8313 at 11:38 AM EST Patient notified. Copy to pmd. Follow up scheduled for 04/24/18. Stress test scheduled for 02/06/18.   ------  Notes recorded by Laqueta Linden, MD on 01/30/2018 at 9:08 AM EST Minimal blockages.

## 2018-02-06 ENCOUNTER — Encounter (HOSPITAL_COMMUNITY)
Admission: RE | Admit: 2018-02-06 | Discharge: 2018-02-06 | Disposition: A | Discharge status: Home / Self Care | Payer: Medicare Other | Source: Ambulatory Visit | Attending: Cardiovascular Disease | Admitting: Cardiovascular Disease

## 2018-02-06 ENCOUNTER — Encounter (HOSPITAL_COMMUNITY): Payer: Self-pay

## 2018-02-06 ENCOUNTER — Encounter (HOSPITAL_BASED_OUTPATIENT_CLINIC_OR_DEPARTMENT_OTHER)
Admission: RE | Admit: 2018-02-06 | Discharge: 2018-02-06 | Disposition: A | Discharge status: Home / Self Care | Payer: Medicare Other | Source: Ambulatory Visit | Attending: Cardiovascular Disease | Admitting: Cardiovascular Disease

## 2018-02-06 DIAGNOSIS — I25118 Atherosclerotic heart disease of native coronary artery with other forms of angina pectoris: Secondary | ICD-10-CM | POA: Insufficient documentation

## 2018-02-06 DIAGNOSIS — R55 Syncope and collapse: Secondary | ICD-10-CM | POA: Diagnosis not present

## 2018-02-06 LAB — NM MYOCAR MULTI W/SPECT W/WALL MOTION / EF
LV dias vol: 42 mL (ref 46–106)
LV sys vol: 6 mL
Peak HR: 96 {beats}/min
RATE: 0.32
Rest HR: 50 {beats}/min
SDS: 4
SRS: 2
SSS: 6
TID: 1.44

## 2018-02-06 MED ORDER — REGADENOSON 0.4 MG/5ML IV SOLN
INTRAVENOUS | Status: AC
  Administered 2018-02-06: 0.4 mg via INTRAVENOUS
  Filled 2018-02-06: qty 5

## 2018-02-06 MED ORDER — TECHNETIUM TC 99M TETROFOSMIN IV KIT
30.0000 | PACK | Freq: Once | INTRAVENOUS | Status: AC | PRN
  Administered 2018-02-06: 30 via INTRAVENOUS

## 2018-02-06 MED ORDER — TECHNETIUM TC 99M TETROFOSMIN IV KIT
10.0000 | PACK | Freq: Once | INTRAVENOUS | Status: AC | PRN
  Administered 2018-02-06: 9.95 via INTRAVENOUS

## 2018-02-06 MED ORDER — SODIUM CHLORIDE 0.9% FLUSH
INTRAVENOUS | Status: AC
  Administered 2018-02-06: 10 mL via INTRAVENOUS
  Filled 2018-02-06: qty 10

## 2018-02-18 DIAGNOSIS — E039 Hypothyroidism, unspecified: Secondary | ICD-10-CM | POA: Diagnosis not present

## 2018-03-04 ENCOUNTER — Telehealth: Payer: Self-pay | Admitting: *Deleted

## 2018-03-04 NOTE — Telephone Encounter (Signed)
Notes recorded by Lesle Chris, LPN on 07/31/4035 at 10:39 AM EST Patient notified. Copy to pmd. Follow up scheduled for March with Dr. Baltazar Apo office.   ------  Notes recorded by Laqueta Linden, MD on 03/03/2018 at 3:26 PM EST Brief episode of what appeared to be atrial fibrillation. However, she is a poor candidate for systemic anticoagulation given frequent episodes of syncope, falls, and subdural hematoma. No pauses or arrhythmias to explain syncope per se.

## 2018-04-20 ENCOUNTER — Telehealth: Payer: Self-pay | Admitting: Cardiovascular Disease

## 2018-04-20 NOTE — Telephone Encounter (Signed)
I called and spoke with the patient about rescheduling her appointment given the COVID19 pandemic, in order to avoid unnecessary exposure.  She is very agreeable to having her appointment rescheduled for later date and was thankful for the call.

## 2018-04-20 NOTE — Telephone Encounter (Signed)
Patient rescheduled for 05/11/2018.

## 2018-04-24 ENCOUNTER — Ambulatory Visit: Payer: Medicare Other | Admitting: Cardiovascular Disease

## 2018-05-06 DIAGNOSIS — E039 Hypothyroidism, unspecified: Secondary | ICD-10-CM | POA: Diagnosis not present

## 2018-05-06 DIAGNOSIS — I739 Peripheral vascular disease, unspecified: Secondary | ICD-10-CM | POA: Diagnosis not present

## 2018-05-06 DIAGNOSIS — Z6820 Body mass index (BMI) 20.0-20.9, adult: Secondary | ICD-10-CM | POA: Diagnosis not present

## 2018-05-06 DIAGNOSIS — I10 Essential (primary) hypertension: Secondary | ICD-10-CM | POA: Diagnosis not present

## 2018-05-06 DIAGNOSIS — F331 Major depressive disorder, recurrent, moderate: Secondary | ICD-10-CM | POA: Diagnosis not present

## 2018-05-06 DIAGNOSIS — I2510 Atherosclerotic heart disease of native coronary artery without angina pectoris: Secondary | ICD-10-CM | POA: Diagnosis not present

## 2018-05-06 DIAGNOSIS — K219 Gastro-esophageal reflux disease without esophagitis: Secondary | ICD-10-CM | POA: Diagnosis not present

## 2018-05-06 DIAGNOSIS — E782 Mixed hyperlipidemia: Secondary | ICD-10-CM | POA: Diagnosis not present

## 2018-05-11 ENCOUNTER — Ambulatory Visit: Payer: Medicare Other | Admitting: Cardiology

## 2018-06-05 ENCOUNTER — Ambulatory Visit: Payer: Medicare Other | Admitting: Cardiology

## 2018-06-19 ENCOUNTER — Telehealth: Payer: Self-pay | Admitting: Cardiovascular Disease

## 2018-06-19 NOTE — Telephone Encounter (Signed)
Virtual Visit Pre-Appointment Phone Call  "(Name), I am calling you today to discuss your upcoming appointment. We are currently trying to limit exposure to the virus that causes COVID-19 by seeing patients at home rather than in the office."  1. "What is the BEST phone number to call the day of the visit?" - include this in appointment notes  2. Do you have or have access to (through a family member/friend) a smartphone with video capability that we can use for your visit?" a. If yes - list this number in appt notes as cell (if different from BEST phone #) and list the appointment type as a VIDEO visit in appointment notes b. If no - list the appointment type as a PHONE visit in appointment notes  Confirm consent - "In the setting of the current Covid19 crisis, you are scheduled for a (phone or video) visit with your provider on (date) at (time).  Just as we do with many in-office visits, in order for you to participate in this visit, we must obtain consent.  If you'd like, I can send this to your mychart (if signed up) or email for you to review.  Otherwise, I can obtain your verbal consent now.  All virtual visits are billed to your insurance company just like a normal visit would be.  By agreeing to a virtual visit, we'd like you to understand that the technology does not allow for your provider to perform an examination, and thus may limit your provider's ability to fully assess your condition. If your provider identifies any concerns that need to be evaluated in person, we will make arrangements to do so.  Finally, though the technology is pretty good, we cannot assure that it will always work on either your or our end, and in the setting of a video visit, we may have to convert it to a phone-only visit.  In either situation, we cannot ensure that we have a secure connection.  Are you willing to proceed?" STAFF: Did the patient verbally acknowledge consent to telehealth visit? Document  YES/NO here: YES 3. Advise patient to be prepared - "Two hours prior to your appointment, go ahead and check your blood pressure, pulse, oxygen saturation, and your weight (if you have the equipment to check those) and write them all down. When your visit starts, your provider will ask you for this information. If you have an Apple Watch or Kardia device, please plan to have heart rate information ready on the day of your appointment. Please have a pen and paper handy nearby the day of the visit as well."  4. Give patient instructions for MyChart download to smartphone OR Doximity/Doxy.me as below if video visit (depending on what platform provider is using)  5. Inform patient they will receive a phone call 15 minutes prior to their appointment time (may be from unknown caller ID) so they should be prepared to answer    TELEPHONE CALL NOTE  Melissa Bentley has been deemed a candidate for a follow-up tele-health visit to limit community exposure during the Covid-19 pandemic. I spoke with the patient via phone to ensure availability of phone/video source, confirm preferred email & phone number, and discuss instructions and expectations.  I reminded Melissa Bentley to be prepared with any vital sign and/or heart rhythm information that could potentially be obtained via home monitoring, at the time of her visit. I reminded Melissa Bentley to expect a phone call prior to her visit.  Geraldine ContrasStephanie R Smith 06/19/2018 3:13 PM   INSTRUCTIONS FOR DOWNLOADING THE MYCHART APP TO SMARTPHONE  - The patient must first make sure to have activated MyChart and know their login information - If Apple, go to [employer] Store and type in MyChart in the search bar and download the app. If Android, ask patient to go to [employer] Play Store and type in [facility] in the search bar and download the app. The app is free but as with any other app downloads, their phone may require them to verify saved payment information or  Apple/Android password.  - The patient will need to then log into the app with their MyChart username and password, and select [facility] as their healthcare provider to link the account. When it is time for your visit, go to the MyChart app, find appointments, and click Begin Video Visit. Be sure to Select Allow for your device to access the Microphone and Camera for your visit. You will then be connected, and your provider will be with you shortly.  **If they have any issues connecting, or need assistance please contact MyChart service desk [phone])**  **If using a computer, in order to ensure the best quality for their visit they will need to use either of the following Internet Browsers: [employer] Edge, or Google Chrome**  IF USING DOXIMITY or DOXY.ME - The patient will receive a link just prior to their visit by text.     FULL LENGTH CONSENT FOR TELE-HEALTH VISIT   I hereby voluntarily request, consent and authorize [facility] and its employed or contracted physicians, physician assistants, nurse practitioners or other licensed health care professionals (the Practitioner), to provide me with telemedicine health care services (the Services") as deemed necessary by the treating Practitioner. I acknowledge and consent to receive the Services by the Practitioner via telemedicine. I understand that the telemedicine visit will involve communicating with the Practitioner through live audiovisual communication technology and the disclosure of certain medical information by electronic transmission. I acknowledge that I have been given the opportunity to request an in-person assessment or other available alternative prior to the telemedicine visit and am voluntarily participating in the telemedicine visit.  I understand that I have the right to withhold or withdraw my consent to the use of telemedicine in the course of my care at any time, without affecting my right to future care  or treatment, and that the Practitioner or I may terminate the telemedicine visit at any time. I understand that I have the right to inspect all information obtained and/or recorded in the course of the telemedicine visit and may receive copies of available information for a reasonable fee.  I understand that some of the potential risks of receiving the Services via telemedicine include:   Delay or interruption in medical evaluation due to technological equipment failure or disruption;  Information transmitted may not be sufficient (e.g. poor resolution of images) to allow for appropriate medical decision making by the Practitioner; and/or   In rare instances, security protocols could fail, causing a breach of personal health information.  Furthermore, I acknowledge that it is my responsibility to provide information about my medical history, conditions and care that is complete and accurate to the best of my ability. I acknowledge that Practitioner's advice, recommendations, and/or decision may be based on factors not within their control, such as incomplete or inaccurate data provided by me or distortions of diagnostic images or specimens that may result from electronic transmissions. I understand that the  practice of medicine is not an Chief Strategy Officer and that Practitioner makes no warranties or guarantees regarding treatment outcomes. I acknowledge that I will receive a copy of this consent concurrently upon execution via email to the email address I last provided but may also request a printed copy by calling the office of [facility].    I understand that my insurance will be billed for this visit.   I have read or had this consent read to me.  I understand the contents of this consent, which adequately explains the benefits and risks of the Services being provided via telemedicine.   I have been provided ample opportunity to ask questions regarding this consent and the Services and have had  my questions answered to my satisfaction.  I give my informed consent for the services to be provided through the use of telemedicine in my medical care  By participating in this telemedicine visit I agree to the above.

## 2018-06-24 ENCOUNTER — Telehealth: Payer: Medicare Other | Admitting: Cardiovascular Disease

## 2018-08-04 ENCOUNTER — Telehealth: Payer: Self-pay | Admitting: Cardiovascular Disease

## 2018-08-04 NOTE — Telephone Encounter (Signed)
Virtual Visit Pre-Appointment Phone Call  "(Name), I am calling you today to discuss your upcoming appointment. We are currently trying to limit exposure to the virus that causes COVID-19 by seeing patients at home rather than in the office."  "What is the BEST phone number to call the day of the visit?" - [phone]   1. Do you have or have access to (through a family member/friend) a smartphone with video capability that we can use for your visit?" a. If yes - list this number in appt notes as cell (if different from BEST phone #) and list the appointment type as a VIDEO visit in appointment notes b. If no - list the appointment type as a PHONE visit in appointment notes  2. Confirm consent - "In the setting of the current Covid19 crisis, you are scheduled for a (phone or video) visit with your provider on (date) at (time).  Just as we do with many in-office visits, in order for you to participate in this visit, we must obtain consent.  If you'd like, I can send this to your mychart (if signed up) or email for you to review.  Otherwise, I can obtain your verbal consent now.  All virtual visits are billed to your insurance company just like a normal visit would be.  By agreeing to a virtual visit, we'd like you to understand that the technology does not allow for your provider to perform an examination, and thus may limit your provider's ability to fully assess your condition. If your provider identifies any concerns that need to be evaluated in person, we will make arrangements to do so.  Finally, though the technology is pretty good, we cannot assure that it will always work on either your or our end, and in the setting of a video visit, we may have to convert it to a phone-only visit.  In either situation, we cannot ensure that we have a secure connection.  Are you willing to proceed?" STAFF: Did the patient verbally acknowledge consent to telehealth visit? Document YES/NO here:  yes 3.  4. Advise patient to be prepared - "Two hours prior to your appointment, go ahead and check your blood pressure, pulse, oxygen saturation, and your weight (if you have the equipment to check those) and write them all down. When your visit starts, your provider will ask you for this information. If you have an Apple Watch or Kardia device, please plan to have heart rate information ready on the day of your appointment. Please have a pen and paper handy nearby the day of the visit as well."  5. Give patient instructions for MyChart download to smartphone OR Doximity/Doxy.me as below if video visit (depending on what platform provider is using)  6. Inform patient they will receive a phone call 15 minutes prior to their appointment time (may be from unknown caller ID) so they should be prepared to answer    TELEPHONE CALL NOTE  Vanessia R Enfield has been deemed a candidate for a follow-up tele-health visit to limit community exposure during the Covid-19 pandemic. I spoke with the patient via phone to ensure availability of phone/video source, confirm preferred email & phone number, and discuss instructions and expectations.  I reminded Melissa Bentley to be prepared with any vital sign and/or heart rhythm information that could potentially be obtained via home monitoring, at the time of her visit. I reminded Melissa Bentley to expect a phone call prior to her visit.  Megan SalonVicky T Slaughter 08/04/2018 12:44 PM   INSTRUCTIONS FOR DOWNLOADING THE MYCHART APP TO SMARTPHONE  - The patient must first make sure to have activated MyChart and know their login information - If Apple, go to [employer] and type in MyChart in the search bar and download the app. If Android, ask patient to go to [employer] Play Store and type in [facility] in the search bar and download the app. The app is free but as with any other app downloads, their phone may require them to verify saved payment information or Apple/Android  password.  - The patient will need to then log into the app with their MyChart username and password, and select [facility] as their healthcare provider to link the account. When it is time for your visit, go to the MyChart app, find appointments, and click Begin Video Visit. Be sure to Select Allow for your device to access the Microphone and Camera for your visit. You will then be connected, and your provider will be with you shortly.  **If they have any issues connecting, or need assistance please contact MyChart service desk [phone])**  **If using a computer, in order to ensure the best quality for their visit they will need to use either of the following Internet Browsers: [employer] Edge, or Google Chrome**  IF USING DOXIMITY or DOXY.ME - The patient will receive a link just prior to their visit by text.     FULL LENGTH CONSENT FOR TELE-HEALTH VISIT   I hereby voluntarily request, consent and authorize [facility] and its employed or contracted physicians, physician assistants, nurse practitioners or other licensed health care professionals (the Practitioner), to provide me with telemedicine health care services (the Services") as deemed necessary by the treating Practitioner. I acknowledge and consent to receive the Services by the Practitioner via telemedicine. I understand that the telemedicine visit will involve communicating with the Practitioner through live audiovisual communication technology and the disclosure of certain medical information by electronic transmission. I acknowledge that I have been given the opportunity to request an in-person assessment or other available alternative prior to the telemedicine visit and am voluntarily participating in the telemedicine visit.  I understand that I have the right to withhold or withdraw my consent to the use of telemedicine in the course of my care at any time, without affecting my right to future care or treatment,  and that the Practitioner or I may terminate the telemedicine visit at any time. I understand that I have the right to inspect all information obtained and/or recorded in the course of the telemedicine visit and may receive copies of available information for a reasonable fee.  I understand that some of the potential risks of receiving the Services via telemedicine include:   Delay or interruption in medical evaluation due to technological equipment failure or disruption;  Information transmitted may not be sufficient (e.g. poor resolution of images) to allow for appropriate medical decision making by the Practitioner; and/or   In rare instances, security protocols could fail, causing a breach of personal health information.  Furthermore, I acknowledge that it is my responsibility to provide information about my medical history, conditions and care that is complete and accurate to the best of my ability. I acknowledge that Practitioner's advice, recommendations, and/or decision may be based on factors not within their control, such as incomplete or inaccurate data provided by me or distortions of diagnostic images or specimens that may result from electronic transmissions. I understand that the  practice of medicine is not an Chief Strategy Officer and that Practitioner makes no warranties or guarantees regarding treatment outcomes. I acknowledge that I will receive a copy of this consent concurrently upon execution via email to the email address I last provided but may also request a printed copy by calling the office of [place].    I understand that my insurance will be billed for this visit.   I have read or had this consent read to me.  I understand the contents of this consent, which adequately explains the benefits and risks of the Services being provided via telemedicine.   I have been provided ample opportunity to ask questions regarding this consent and the Services and have had my questions  answered to my satisfaction.  I give my informed consent for the services to be provided through the use of telemedicine in my medical care  By participating in this telemedicine visit I agree to the above.

## 2018-08-05 ENCOUNTER — Telehealth (INDEPENDENT_AMBULATORY_CARE_PROVIDER_SITE_OTHER): Payer: Medicare Other | Admitting: Cardiovascular Disease

## 2018-08-05 ENCOUNTER — Encounter: Payer: Self-pay | Admitting: Cardiovascular Disease

## 2018-08-05 VITALS — BP 121/63 | HR 62 | Ht 60.0 in | Wt 108.0 lb

## 2018-08-05 DIAGNOSIS — I25118 Atherosclerotic heart disease of native coronary artery with other forms of angina pectoris: Secondary | ICD-10-CM

## 2018-08-05 DIAGNOSIS — R55 Syncope and collapse: Secondary | ICD-10-CM

## 2018-08-05 DIAGNOSIS — I10 Essential (primary) hypertension: Secondary | ICD-10-CM

## 2018-08-05 DIAGNOSIS — I779 Disorder of arteries and arterioles, unspecified: Secondary | ICD-10-CM

## 2018-08-05 DIAGNOSIS — I739 Peripheral vascular disease, unspecified: Secondary | ICD-10-CM

## 2018-08-05 NOTE — Patient Instructions (Signed)

## 2018-08-05 NOTE — Progress Notes (Signed)
Virtual Visit via Telephone Note   This visit type was conducted due to national recommendations for restrictions regarding the COVID-19 Pandemic (e.g. social distancing) in an effort to limit this patient's exposure and mitigate transmission in our community.  Due to her co-morbid illnesses, this patient is at least at moderate risk for complications without adequate follow up.  This format is felt to be most appropriate for this patient at this time.  The patient did not have access to video technology/had technical difficulties with video requiring transitioning to audio format only (telephone).  All issues noted in this document were discussed and addressed.  No physical exam could be performed with this format.  Please refer to the patient's chart for her  consent to telehealth for [facility].   Date:  08/05/2018   ID:  Melissa MullLorene R Fetherolf, DOB [birth year], MRN [id number]  Patient Location: Home Provider Location: Office  PCP:  Richardean Chimeraaniel, [name], MD  Cardiologist:  Prentice DockerSuresh Brinley Rosete, MD  Electrophysiologist:  None   Evaluation Performed:  Follow-Up Visit  Chief Complaint:  Syncope, CAD  History of Present Illness:    Melissa Bentley is a 85 y.o. female with a history of syncope/near syncope, coronary artery disease (s/p RCA stent), and peripheral vascular disease and has a right subclavian artery stent. Past medical history includes multiple falls and chronic subdural hematoma.  She underwent evacuation for this most recently in September 2019.  She denies any loss of consciousness. She also denies chest pain and any significant shortness of breath.  She did lose her balance 2-3 days ago and fell and hit her knee.  The patient does not have symptoms concerning for COVID-19 infection (fever, chills, cough, or new shortness of breath).    Past Medical History:  Diagnosis Date  . CAD (coronary artery disease)   . Depression   . GERD (gastroesophageal reflux disease)   .  Hyperlipidemia   . Hypertension   . Hypothyroidism   . MI (myocardial infarction) (HCC)   . Osteoporosis   . Psoriasis   . PVD (peripheral vascular disease) (HCC)    Past Surgical History:  Procedure Laterality Date  . ABDOMINAL HYSTERECTOMY    . COLONOSCOPY N/A 02/01/2015   Dr. Rourk:colonic diverticulosis s/p biopsy with unremarkable colonic mucosa, no microscopic colitis.   . CORONARY ANGIOPLASTY WITH STENT PLACEMENT    . CRANIOTOMY Right 10/03/2017   Procedure: CRANIOTOMY HEMATOMA EVACUATION SUBDURAL;  Surgeon: Coletta Memosabbell, Kyle, MD;  Location: [facility] OR;  Service: Neurosurgery;  Laterality: Right;  . CRANIOTOMY Right 10/05/2017   Procedure: REDO CRANIOTOMY HEMATOMA EVACUATION SUBDURAL;  Surgeon: Coletta Memosabbell, Kyle, MD;  Location: [facility] OR;  Service: Neurosurgery;  Laterality: Right;  . LUNG LOBECTOMY    . SUBCLAVIAN ARTERY STENT    . TUBAL LIGATION       Current Meds  Medication Sig  . aspirin 81 MG tablet Take 81 mg by mouth daily. Reported on 04/27/2015  . atorvastatin (LIPITOR) 40 MG tablet Take 40 mg by mouth daily at 6 PM.   . cilostazol (PLETAL) 100 MG tablet Take 100 mg by mouth 2 (two) times daily.   . citalopram (CELEXA) 20 MG tablet Take 20 mg by mouth daily.   Marland Kitchen. levothyroxine (SYNTHROID, LEVOTHROID) 75 MCG tablet Take 75 mcg by mouth daily before breakfast.   . lisinopril (PRINIVIL,ZESTRIL) 40 MG tablet Take 40 mg by mouth daily.   . metoprolol (LOPRESSOR) 50 MG tablet Take 50 mg by mouth 2 (two) times daily.   .Marland Kitchen  pantoprazole (PROTONIX) 40 MG tablet Take 40 mg by mouth daily.     Allergies:   Patient has no known allergies.   Social History   Tobacco Use  . Smoking status: Former Smoker    Quit date: 01/17/1996    Years since quitting: 22.5  . Smokeless tobacco: Never Used  Substance Use Topics  . Alcohol use: No    Alcohol/week: 0.0 standard drinks  . Drug use: No     Family Hx: The patient's family history includes Lung cancer in her sister. There is no history of Colon  cancer.  ROS:   Please see the history of present illness.     All other systems reviewed and are negative.   Prior CV studies:   The following studies were reviewed today:  Normal nuclear stress test on 02/06/2018, LVEF 85%.  Event monitor in January 2020 showed sinus rhythm with rare PACs and PVCs.  There was one episode of atrial fibrillation, 100 bpm.  Average heart rate was 60 bpm.  Labs/Other Tests and Data Reviewed:    EKG:  No ECG reviewed.  Recent Labs: 10/05/2017: Hemoglobin 8.2; Potassium 4.0; Sodium 140   Recent Lipid Panel No results found for: CHOL, TRIG, HDL, CHOLHDL, LDLCALC, LDLDIRECT  Wt Readings from Last 3 Encounters:  08/05/18 108 lb (49 kg)  01/26/18 108 lb (49 kg)  10/03/17 108 lb (49 kg)     Objective:    Vital Signs:  BP 121/63   Pulse 62   Ht 5' (1.524 m)   Wt 108 lb (49 kg)   BMI 21.09 kg/m    VITAL SIGNS:  reviewed  ASSESSMENT & PLAN:    1.  Syncope: No recurrence. Prior episode likely vasovagal in etiology and it is unclear if she had frank syncopal or near syncopal episodes at that time.  Nuclear stress test was normal.  Event monitoring showed nothing to explain syncope.  2.  Coronary artery disease: History of coronary artery stent in 2001 at the time of MI.  Normal nuclear stress test in January 2020. Currently on aspirin, atorvastatin, and Lopressor.  Symptomatically stable.  No changes to therapy.  3.  Right carotid bruit: 1 to 39% bilateral internal carotid artery stenosis by Dopplers on 01/30/2018.  4.  Hypertension: Controlled on present therapy.  No changes.  5.  Peripheral vascular disease: History of right subclavian artery stent.  Asymptomatic from this standpoint.  Continue aspirin and statin.  6.  Paroxysmal atrial fibrillation: Event monitoring in January 2020 showed 1 episode of atrial fibrillation, 100 bpm.  However, she is a poor candidate for systemic anticoagulation given frequent episodes of syncope, falls, and  subdural hematoma.  Continue aspirin and Lopressor.   COVID-19 Education: The signs and symptoms of COVID-19 were discussed with the patient and how to seek care for testing (follow up with PCP or arrange E-visit).  The importance of social distancing was discussed today.  Time:   Today, I have spent 15 minutes with the patient with telehealth technology discussing the above problems.     Medication Adjustments/Labs and Tests Ordered: Current medicines are reviewed at length with the patient today.  Concerns regarding medicines are outlined above.   Tests Ordered: No orders of the defined types were placed in this encounter.   Medication Changes: No orders of the defined types were placed in this encounter.   Follow Up:  Virtual Visit in 6 month(s)  Signed, Kate Sable, MD  08/05/2018 1:50 PM  [facility]

## 2018-09-01 DIAGNOSIS — E039 Hypothyroidism, unspecified: Secondary | ICD-10-CM | POA: Diagnosis not present

## 2018-09-01 DIAGNOSIS — I10 Essential (primary) hypertension: Secondary | ICD-10-CM | POA: Diagnosis not present

## 2018-09-01 DIAGNOSIS — E782 Mixed hyperlipidemia: Secondary | ICD-10-CM | POA: Diagnosis not present

## 2018-09-01 DIAGNOSIS — F331 Major depressive disorder, recurrent, moderate: Secondary | ICD-10-CM | POA: Diagnosis not present

## 2018-09-01 DIAGNOSIS — K210 Gastro-esophageal reflux disease with esophagitis: Secondary | ICD-10-CM | POA: Diagnosis not present

## 2018-09-01 DIAGNOSIS — K219 Gastro-esophageal reflux disease without esophagitis: Secondary | ICD-10-CM | POA: Diagnosis not present

## 2018-09-01 DIAGNOSIS — R911 Solitary pulmonary nodule: Secondary | ICD-10-CM | POA: Diagnosis not present

## 2018-09-02 DIAGNOSIS — Z961 Presence of intraocular lens: Secondary | ICD-10-CM | POA: Diagnosis not present

## 2018-09-02 DIAGNOSIS — H52203 Unspecified astigmatism, bilateral: Secondary | ICD-10-CM | POA: Diagnosis not present

## 2018-09-02 DIAGNOSIS — H35371 Puckering of macula, right eye: Secondary | ICD-10-CM | POA: Diagnosis not present

## 2018-09-04 DIAGNOSIS — I10 Essential (primary) hypertension: Secondary | ICD-10-CM | POA: Diagnosis not present

## 2018-09-04 DIAGNOSIS — R911 Solitary pulmonary nodule: Secondary | ICD-10-CM | POA: Diagnosis not present

## 2018-09-04 DIAGNOSIS — E039 Hypothyroidism, unspecified: Secondary | ICD-10-CM | POA: Diagnosis not present

## 2018-09-04 DIAGNOSIS — L900 Lichen sclerosus et atrophicus: Secondary | ICD-10-CM | POA: Diagnosis not present

## 2018-09-04 DIAGNOSIS — Z681 Body mass index (BMI) 19 or less, adult: Secondary | ICD-10-CM | POA: Diagnosis not present

## 2018-09-04 DIAGNOSIS — F331 Major depressive disorder, recurrent, moderate: Secondary | ICD-10-CM | POA: Diagnosis not present

## 2018-09-04 DIAGNOSIS — I2510 Atherosclerotic heart disease of native coronary artery without angina pectoris: Secondary | ICD-10-CM | POA: Diagnosis not present

## 2018-09-04 DIAGNOSIS — I739 Peripheral vascular disease, unspecified: Secondary | ICD-10-CM | POA: Diagnosis not present

## 2018-09-15 NOTE — Progress Notes (Addendum)
[facility] - Clinic Note  09/16/2018     CHIEF COMPLAINT Patient presents for Retina Evaluation   HISTORY OF PRESENT ILLNESS: Melissa Bentley is a 85 y.o. female who presents to the clinic today for:   HPI    Retina Evaluation    In right eye.  This started years ago.  Duration of years.  Associated Symptoms Floaters and Distortion.  Context:  distance vision, mid-range vision and near vision.  Treatments tried include no treatments.  I, the attending physician,  performed the HPI with the patient and updated documentation appropriately.          Comments    85 y/o female pt referred by Dr. Si GaulFriedrichs for eval of ERM OD.  Last saw Dr. Si GaulFriedrichs a couple of weeks ago.  Says Dr. Si GaulFriedrichs has been watching the ERM for several yrs, but feels it may be time to do something about it.  VA OD very blurred.  OS blurred today as well.  Denies pain, flashes, but has intermittent floaters OU.  No gtts.       Last edited by Rennis ChrisZamora, Pama Roskos, MD on 09/16/2018  8:30 AM. (History)    pt states she feels like she does not use her right eye to see, she states when she renews her drivers license they dont check her right eye, she states everything runs together in her right eye, she states they have been watching her eye for 15 years, but just recently it's gotten a lot worse, pt states she has been housebound for about a year bc she had 2 surgeries on her brain in 3 days to relieve bleeding, pt states she had several falls and passed out once causing the bleeding in her brain, pt states she had a heart attack in 2001 and has 2 stents in her heart,   Referring physician: Tivis RingerFriedrichs, Gray, OD [address] [place],  [place] [id number]  HISTORICAL INFORMATION:   Selected notes from the MEDICAL RECORD NUMBER Referred by Dr. Tivis RingerGray Friedrichs for concern of ERM OD LEE: 08.05.20 (G. Friedrichs) [BCVA: OD: 20/200 OS: 20/30] Ocular Hx-pseudo OU PMH-CAD, depression, HLD, HTN,  hypothyroidism   CURRENT MEDICATIONS: No current outpatient medications on file. (Ophthalmic Drugs)   No current facility-administered medications for this visit.  (Ophthalmic Drugs)   Current Outpatient Medications (Other)  Medication Sig  . aspirin 81 MG tablet Take 81 mg by mouth daily. Reported on 04/27/2015  . atorvastatin (LIPITOR) 40 MG tablet Take 40 mg by mouth daily at 6 PM.   . cilostazol (PLETAL) 100 MG tablet Take 100 mg by mouth 2 (two) times daily.   . citalopram (CELEXA) 20 MG tablet Take 20 mg by mouth daily.   Marland Kitchen. levothyroxine (SYNTHROID) 50 MCG tablet Take 50 mcg by mouth daily.  Marland Kitchen. lisinopril (PRINIVIL,ZESTRIL) 40 MG tablet Take 40 mg by mouth daily.   . metoprolol (LOPRESSOR) 50 MG tablet Take 50 mg by mouth 2 (two) times daily.   . pantoprazole (PROTONIX) 40 MG tablet Take 40 mg by mouth daily.  . traZODone (DESYREL) 50 MG tablet Take 100 mg by mouth at bedtime.  Marland Kitchen. levothyroxine (SYNTHROID, LEVOTHROID) 75 MCG tablet Take 75 mcg by mouth daily before breakfast.    No current facility-administered medications for this visit.  (Other)      REVIEW OF SYSTEMS: ROS    Positive for: Gastrointestinal, Eyes   Negative for: Constitutional, Neurological, Skin, Genitourinary, Musculoskeletal, HENT, Endocrine, Cardiovascular, Respiratory, Psychiatric, Allergic/Imm, Heme/Lymph  Last edited by Matthew Folks, COA on 09/16/2018  8:20 AM. (History)       ALLERGIES No Known Allergies  PAST MEDICAL HISTORY Past Medical History:  Diagnosis Date  . CAD (coronary artery disease)   . Depression   . GERD (gastroesophageal reflux disease)   . Hyperlipidemia   . Hypertension   . Hypothyroidism   . MI (myocardial infarction) ([place])   . Osteoporosis   . Psoriasis   . PVD (peripheral vascular disease) ([place])    Past Surgical History:  Procedure Laterality Date  . ABDOMINAL HYSTERECTOMY    . CATARACT EXTRACTION Bilateral   . COLONOSCOPY N/A 02/01/2015   Dr. Rourk:colonic  diverticulosis s/p biopsy with unremarkable colonic mucosa, no microscopic colitis.   . CORONARY ANGIOPLASTY WITH STENT PLACEMENT    . CRANIOTOMY Right 10/03/2017   Procedure: CRANIOTOMY HEMATOMA EVACUATION SUBDURAL;  Surgeon: Ashok Pall, MD;  Location: [place];  Service: Neurosurgery;  Laterality: Right;  . CRANIOTOMY Right 10/05/2017   Procedure: REDO CRANIOTOMY HEMATOMA EVACUATION SUBDURAL;  Surgeon: Ashok Pall, MD;  Location: [place];  Service: Neurosurgery;  Laterality: Right;  . EYE SURGERY    . LUNG LOBECTOMY    . SUBCLAVIAN ARTERY STENT    . TUBAL LIGATION      FAMILY HISTORY Family History  Problem Relation Age of Onset  . Lung cancer Sister   . Macular degeneration Sister   . Colon cancer Neg Hx     SOCIAL HISTORY Social History   Tobacco Use  . Smoking status: Former Smoker    Quit date: 01/17/1996    Years since quitting: 22.6  . Smokeless tobacco: Never Used  Substance Use Topics  . Alcohol use: No    Alcohol/week: 0.0 standard drinks  . Drug use: No         OPHTHALMIC EXAM:  Base Eye Exam    Visual Acuity (Snellen - Linear)      Right Left   Dist cc 20/150 20/50   Dist ph cc 20/100 -2 NI   Correction: Glasses       Tonometry (Tonopen, 8:22 AM)      Right Left   Pressure 12 13       Pupils      Dark Light Shape React APD   Right 4 3 Round Brisk None   Left 4 3 Round Brisk None       Visual Fields (Counting fingers)      Left Right    Full Full       Extraocular Movement      Right Left    Full, Ortho Full, Ortho       Neuro/Psych    Oriented x3: Yes   Mood/Affect: Normal       Dilation    Both eyes: 1.0% Mydriacyl, 2.5% Phenylephrine @ 8:39 AM        Slit Lamp and Fundus Exam    Slit Lamp Exam      Right Left   Lids/Lashes Dermatochalasis - upper lid Dermatochalasis - upper lid   Conjunctiva/Sclera White and quiet White and quiet   Cornea mild Arcus, trace Punctate epithelial erosions mild Arcus, trace Punctate epithelial  erosions, fine endo pigment   Anterior Chamber Deep and quiet Deep and quiet   Iris Round and moderately dilated to 5.20mm Round and moderately poorly to 5.80mm   Lens 3 piece PC IOL in good position 3 piece PC IOL in good position   Vitreous Vitreous syneresis Vitreous  syneresis, Posterior vitreous detachment, vitreous condensations       Fundus Exam      Right Left   Disc Pink and Sharp Sharp rim, trace pallor   C/D Ratio 0.2 0.2   Macula Flat, Blunted foveal reflex, Epiretinal membrane with striea, central cystic changes, RPE mottling and clumping, few punctate heme / MA Flat, Blunted foveal reflex, Retinal pigment epithelial mottling, No heme or edema   Vessels Vascular attenuation, Tortuous Vascular attenuation, Tortuous   Periphery Attached, No heme  Attached, No heme         Refraction    Wearing Rx      Sphere Cylinder Axis Add   Right -0.25 +1.00 168 +2.75   Left +0.25 +0.75 168 +2.75   Age: 85 yrs   Type: Trifocal       Manifest Refraction      Sphere Cylinder Axis Dist VA   Right -1.25 +0.75 165 20/80-2   Left [place] +0.75 168 20/50          IMAGING AND PROCEDURES  Imaging and Procedures for @TODAY @  OCT, Retina - OU - Both Eyes       Right Eye Quality was good. Central Foveal Thickness: 446. Progression has no prior data. Findings include abnormal foveal contour, epiretinal membrane, intraretinal fluid, no SRF, macular pucker, retinal drusen , subretinal hyper-reflective material ( ERM with macular pucker and central cystic changes, mild drusen, vitelliform like lesion).   Left Eye Quality was good. Central Foveal Thickness: 246. Progression has no prior data. Findings include normal foveal contour, no IRF, no SRF, retinal drusen  (Partial PVD).   Notes *Images captured and stored on drive  Diagnosis / Impression:  OD: ERM with macular pucker and central cystic changes, mild drusen, central vitelliform like lesion OS: NFP, no IRF/SRF OU; mild  drusen  Clinical management:  See below  Abbreviations: NFP - Normal foveal profile. CME - cystoid macular edema. PED - pigment epithelial detachment. IRF - intraretinal fluid. SRF - subretinal fluid. EZ - ellipsoid zone. ERM - epiretinal membrane. ORA - outer retinal atrophy. ORT - outer retinal tubulation. SRHM - subretinal hyper-reflective material                 ASSESSMENT/PLAN:    ICD-10-CM   1. Epiretinal membrane (ERM) of right eye  H35.371   2. Retinal edema  H35.81 OCT, Retina - OU - Both Eyes  3. Essential hypertension  I10   4. Hypertensive retinopathy of both eyes  H35.033   5. Pseudophakia of both eyes  Z96.1     1,2. Epiretinal membrane, OD  - The natural history, anatomy, potential for loss of vision, and treatment options including vitrectomy techniques and the complications of endophthalmitis, retinal detachment, vitreous hemorrhage, cataract progression and permanent vision loss discussed with the patient.  - BCVA 20/100 -2 OD  - significant ERM with pucker, cystic edema, and impending lamellar hole  - recommend 25g PPV/MP/C3F8 14% OD under general anesthesia  - RBA of procedure discussed, questions answered  - pt wishes to think about having surgery before making a decision  - f/u 4 weeks, DFE, OCT, possible surgical planning -- sooner prn  3,4. Hypertensive retinopathy OU  - discussed importance of tight BP control  - monitor  5. Pseudophakia OU  - s/p CE/IOL OU ([facility])  - IOLs in good position  - monitor   Ophthalmic Meds Ordered this visit:  No orders of the defined types were placed in this encounter.  Return in about 4 weeks (around 10/14/2018) for f/u ERM OD, DFE, OCT.  There are no Patient Instructions on file for this visit.   Explained the diagnoses, plan, and follow up with the patient and they expressed understanding.  Patient expressed understanding of the importance of proper follow up care.   This document serves as  a record of services personally performed by Karie ChimeraBrian G. Malekai Markwood, MD, PhD. It was created on their behalf by Laurian BrimAmanda Brown, OA, an ophthalmic assistant. The creation of this record is the provider's dictation and/or activities during the visit.    Electronically signed by: Laurian BrimAmanda Brown, OA  08.18.2020 9:38 AM    Karie ChimeraBrian G. Jarrick Fjeld, M.D., Ph.D. Diseases & Surgery of the Retina and Vitreous [facility]  I have reviewed the above documentation for accuracy and completeness, and I agree with the above. Karie ChimeraBrian G. Ambriel Gorelick, M.D., Ph.D. 09/16/18 12:07 PM   Abbreviations: M myopia (nearsighted); A astigmatism; H hyperopia (farsighted); P presbyopia; Mrx spectacle prescription;  CTL contact lenses; OD right eye; OS left eye; OU both eyes  XT exotropia; ET esotropia; PEK punctate epithelial keratitis; PEE punctate epithelial erosions; DES dry eye syndrome; MGD meibomian gland dysfunction; ATs artificial tears; PFAT's preservative free artificial tears; NSC nuclear sclerotic cataract; PSC posterior subcapsular cataract; ERM epi-retinal membrane; PVD posterior vitreous detachment; RD retinal detachment; DM diabetes mellitus; DR diabetic retinopathy; NPDR non-proliferative diabetic retinopathy; PDR proliferative diabetic retinopathy; CSME clinically significant macular edema; DME diabetic macular edema; dbh dot blot hemorrhages; CWS cotton wool spot; POAG primary open angle glaucoma; C/D cup-to-disc ratio; HVF humphrey visual field; GVF goldmann visual field; OCT optical coherence tomography; IOP intraocular pressure; BRVO Branch retinal vein occlusion; CRVO central retinal vein occlusion; CRAO central retinal artery occlusion; BRAO branch retinal artery occlusion; RT retinal tear; SB scleral buckle; PPV pars plana vitrectomy; VH Vitreous hemorrhage; PRP panretinal laser photocoagulation; IVK intravitreal kenalog; VMT vitreomacular traction; MH Macular hole;  NVD neovascularization of the disc; NVE  neovascularization elsewhere; AREDS age related eye disease study; ARMD age related macular degeneration; POAG primary open angle glaucoma; EBMD epithelial/anterior basement membrane dystrophy; ACIOL anterior chamber intraocular lens; IOL intraocular lens; PCIOL posterior chamber intraocular lens; Phaco/IOL phacoemulsification with intraocular lens placement; PRK photorefractive keratectomy; LASIK laser assisted in situ keratomileusis; HTN hypertension; DM diabetes mellitus; COPD chronic obstructive pulmonary disease

## 2018-09-16 ENCOUNTER — Ambulatory Visit (INDEPENDENT_AMBULATORY_CARE_PROVIDER_SITE_OTHER): Payer: Medicare Other | Admitting: Ophthalmology

## 2018-09-16 ENCOUNTER — Other Ambulatory Visit: Payer: Self-pay

## 2018-09-16 ENCOUNTER — Encounter (INDEPENDENT_AMBULATORY_CARE_PROVIDER_SITE_OTHER): Payer: Self-pay | Admitting: Ophthalmology

## 2018-09-16 DIAGNOSIS — H3581 Retinal edema: Secondary | ICD-10-CM | POA: Diagnosis not present

## 2018-09-16 DIAGNOSIS — H35033 Hypertensive retinopathy, bilateral: Secondary | ICD-10-CM

## 2018-09-16 DIAGNOSIS — H35371 Puckering of macula, right eye: Secondary | ICD-10-CM | POA: Diagnosis not present

## 2018-09-16 DIAGNOSIS — Z961 Presence of intraocular lens: Secondary | ICD-10-CM | POA: Diagnosis not present

## 2018-09-16 DIAGNOSIS — I10 Essential (primary) hypertension: Secondary | ICD-10-CM

## 2018-09-28 DIAGNOSIS — E039 Hypothyroidism, unspecified: Secondary | ICD-10-CM | POA: Diagnosis not present

## 2018-09-28 DIAGNOSIS — E782 Mixed hyperlipidemia: Secondary | ICD-10-CM | POA: Diagnosis not present

## 2018-10-12 DIAGNOSIS — Z23 Encounter for immunization: Secondary | ICD-10-CM | POA: Diagnosis not present

## 2018-10-14 ENCOUNTER — Encounter (INDEPENDENT_AMBULATORY_CARE_PROVIDER_SITE_OTHER): Payer: Medicare Other | Admitting: Ophthalmology

## 2018-12-17 DIAGNOSIS — R300 Dysuria: Secondary | ICD-10-CM | POA: Diagnosis not present

## 2018-12-17 DIAGNOSIS — Z681 Body mass index (BMI) 19 or less, adult: Secondary | ICD-10-CM | POA: Diagnosis not present

## 2018-12-23 ENCOUNTER — Other Ambulatory Visit: Payer: Self-pay

## 2019-01-19 DIAGNOSIS — K219 Gastro-esophageal reflux disease without esophagitis: Secondary | ICD-10-CM | POA: Diagnosis not present

## 2019-01-19 DIAGNOSIS — E039 Hypothyroidism, unspecified: Secondary | ICD-10-CM | POA: Diagnosis not present

## 2019-01-19 DIAGNOSIS — R911 Solitary pulmonary nodule: Secondary | ICD-10-CM | POA: Diagnosis not present

## 2019-01-19 DIAGNOSIS — Z9189 Other specified personal risk factors, not elsewhere classified: Secondary | ICD-10-CM | POA: Diagnosis not present

## 2019-01-19 DIAGNOSIS — I739 Peripheral vascular disease, unspecified: Secondary | ICD-10-CM | POA: Diagnosis not present

## 2019-01-19 DIAGNOSIS — R300 Dysuria: Secondary | ICD-10-CM | POA: Diagnosis not present

## 2019-01-19 DIAGNOSIS — I10 Essential (primary) hypertension: Secondary | ICD-10-CM | POA: Diagnosis not present

## 2019-01-19 DIAGNOSIS — D692 Other nonthrombocytopenic purpura: Secondary | ICD-10-CM | POA: Diagnosis not present

## 2019-01-19 DIAGNOSIS — E782 Mixed hyperlipidemia: Secondary | ICD-10-CM | POA: Diagnosis not present

## 2019-01-19 DIAGNOSIS — F331 Major depressive disorder, recurrent, moderate: Secondary | ICD-10-CM | POA: Diagnosis not present

## 2019-01-19 DIAGNOSIS — L400 Psoriasis vulgaris: Secondary | ICD-10-CM | POA: Diagnosis not present

## 2019-01-19 DIAGNOSIS — I2510 Atherosclerotic heart disease of native coronary artery without angina pectoris: Secondary | ICD-10-CM | POA: Diagnosis not present

## 2019-01-26 DIAGNOSIS — I10 Essential (primary) hypertension: Secondary | ICD-10-CM | POA: Diagnosis not present

## 2019-01-26 DIAGNOSIS — E7800 Pure hypercholesterolemia, unspecified: Secondary | ICD-10-CM | POA: Diagnosis not present

## 2019-02-04 DIAGNOSIS — R0902 Hypoxemia: Secondary | ICD-10-CM | POA: Diagnosis not present

## 2019-02-04 DIAGNOSIS — R5381 Other malaise: Secondary | ICD-10-CM | POA: Diagnosis not present

## 2019-02-05 DIAGNOSIS — Z20822 Contact with and (suspected) exposure to covid-19: Secondary | ICD-10-CM | POA: Diagnosis not present

## 2019-02-07 DIAGNOSIS — R0902 Hypoxemia: Secondary | ICD-10-CM | POA: Diagnosis not present

## 2019-02-07 DIAGNOSIS — R5381 Other malaise: Secondary | ICD-10-CM | POA: Diagnosis not present

## 2019-02-07 DIAGNOSIS — R197 Diarrhea, unspecified: Secondary | ICD-10-CM | POA: Diagnosis not present

## 2019-02-07 DIAGNOSIS — U071 COVID-19: Secondary | ICD-10-CM | POA: Diagnosis not present

## 2019-02-09 DIAGNOSIS — Z87891 Personal history of nicotine dependence: Secondary | ICD-10-CM | POA: Diagnosis not present

## 2019-02-09 DIAGNOSIS — J9601 Acute respiratory failure with hypoxia: Secondary | ICD-10-CM | POA: Diagnosis present

## 2019-02-09 DIAGNOSIS — R0602 Shortness of breath: Secondary | ICD-10-CM | POA: Diagnosis not present

## 2019-02-09 DIAGNOSIS — G4700 Insomnia, unspecified: Secondary | ICD-10-CM | POA: Diagnosis not present

## 2019-02-09 DIAGNOSIS — G9341 Metabolic encephalopathy: Secondary | ICD-10-CM | POA: Diagnosis not present

## 2019-02-09 DIAGNOSIS — I2510 Atherosclerotic heart disease of native coronary artery without angina pectoris: Secondary | ICD-10-CM | POA: Diagnosis present

## 2019-02-09 DIAGNOSIS — F331 Major depressive disorder, recurrent, moderate: Secondary | ICD-10-CM | POA: Diagnosis not present

## 2019-02-09 DIAGNOSIS — E876 Hypokalemia: Secondary | ICD-10-CM | POA: Diagnosis present

## 2019-02-09 DIAGNOSIS — E039 Hypothyroidism, unspecified: Secondary | ICD-10-CM | POA: Diagnosis present

## 2019-02-09 DIAGNOSIS — U071 COVID-19: Secondary | ICD-10-CM | POA: Diagnosis not present

## 2019-02-09 DIAGNOSIS — F5104 Psychophysiologic insomnia: Secondary | ICD-10-CM | POA: Diagnosis present

## 2019-02-09 DIAGNOSIS — F339 Major depressive disorder, recurrent, unspecified: Secondary | ICD-10-CM | POA: Diagnosis not present

## 2019-02-09 DIAGNOSIS — I10 Essential (primary) hypertension: Secondary | ICD-10-CM | POA: Diagnosis present

## 2019-02-09 DIAGNOSIS — K5900 Constipation, unspecified: Secondary | ICD-10-CM | POA: Diagnosis present

## 2019-02-09 DIAGNOSIS — J1281 Pneumonia due to SARS-associated coronavirus: Secondary | ICD-10-CM | POA: Diagnosis not present

## 2019-02-09 DIAGNOSIS — K580 Irritable bowel syndrome with diarrhea: Secondary | ICD-10-CM | POA: Diagnosis not present

## 2019-02-09 DIAGNOSIS — E785 Hyperlipidemia, unspecified: Secondary | ICD-10-CM | POA: Diagnosis present

## 2019-02-09 DIAGNOSIS — J1282 Pneumonia due to coronavirus disease 2019: Secondary | ICD-10-CM | POA: Diagnosis not present

## 2019-02-09 DIAGNOSIS — D649 Anemia, unspecified: Secondary | ICD-10-CM | POA: Diagnosis present

## 2019-02-09 DIAGNOSIS — M6281 Muscle weakness (generalized): Secondary | ICD-10-CM | POA: Diagnosis not present

## 2019-02-09 DIAGNOSIS — K219 Gastro-esophageal reflux disease without esophagitis: Secondary | ICD-10-CM | POA: Diagnosis present

## 2019-02-09 DIAGNOSIS — Z7401 Bed confinement status: Secondary | ICD-10-CM | POA: Diagnosis not present

## 2019-02-09 DIAGNOSIS — I252 Old myocardial infarction: Secondary | ICD-10-CM | POA: Diagnosis not present

## 2019-02-09 DIAGNOSIS — I6782 Cerebral ischemia: Secondary | ICD-10-CM | POA: Diagnosis not present

## 2019-02-09 DIAGNOSIS — Z7982 Long term (current) use of aspirin: Secondary | ICD-10-CM | POA: Diagnosis not present

## 2019-02-24 DIAGNOSIS — F331 Major depressive disorder, recurrent, moderate: Secondary | ICD-10-CM | POA: Diagnosis not present

## 2019-02-24 DIAGNOSIS — I2510 Atherosclerotic heart disease of native coronary artery without angina pectoris: Secondary | ICD-10-CM | POA: Diagnosis not present

## 2019-02-24 DIAGNOSIS — K5900 Constipation, unspecified: Secondary | ICD-10-CM | POA: Diagnosis not present

## 2019-02-24 DIAGNOSIS — J1281 Pneumonia due to SARS-associated coronavirus: Secondary | ICD-10-CM | POA: Diagnosis not present

## 2019-02-24 DIAGNOSIS — E785 Hyperlipidemia, unspecified: Secondary | ICD-10-CM | POA: Diagnosis not present

## 2019-02-24 DIAGNOSIS — U071 COVID-19: Secondary | ICD-10-CM | POA: Diagnosis not present

## 2019-02-24 DIAGNOSIS — M6281 Muscle weakness (generalized): Secondary | ICD-10-CM | POA: Diagnosis not present

## 2019-02-24 DIAGNOSIS — K219 Gastro-esophageal reflux disease without esophagitis: Secondary | ICD-10-CM | POA: Diagnosis not present

## 2019-02-24 DIAGNOSIS — Z7401 Bed confinement status: Secondary | ICD-10-CM | POA: Diagnosis not present

## 2019-02-24 DIAGNOSIS — K580 Irritable bowel syndrome with diarrhea: Secondary | ICD-10-CM | POA: Diagnosis not present

## 2019-02-24 DIAGNOSIS — J9601 Acute respiratory failure with hypoxia: Secondary | ICD-10-CM | POA: Diagnosis not present

## 2019-02-24 DIAGNOSIS — G4700 Insomnia, unspecified: Secondary | ICD-10-CM | POA: Diagnosis not present

## 2019-02-24 DIAGNOSIS — I10 Essential (primary) hypertension: Secondary | ICD-10-CM | POA: Diagnosis not present

## 2019-02-24 DIAGNOSIS — E039 Hypothyroidism, unspecified: Secondary | ICD-10-CM | POA: Diagnosis not present

## 2019-02-27 DIAGNOSIS — F331 Major depressive disorder, recurrent, moderate: Secondary | ICD-10-CM | POA: Diagnosis not present

## 2019-02-27 DIAGNOSIS — U071 COVID-19: Secondary | ICD-10-CM | POA: Diagnosis not present

## 2019-02-27 DIAGNOSIS — I2510 Atherosclerotic heart disease of native coronary artery without angina pectoris: Secondary | ICD-10-CM | POA: Diagnosis not present

## 2019-02-27 DIAGNOSIS — J1281 Pneumonia due to SARS-associated coronavirus: Secondary | ICD-10-CM | POA: Diagnosis not present

## 2019-03-04 ENCOUNTER — Other Ambulatory Visit: Payer: Self-pay | Admitting: *Deleted

## 2019-03-04 NOTE — Patient Outreach (Signed)
Screened for potential [facility] Care Management needs as a benefit of  [employer] Medicare.  Melissa Bentley is currently receiving skilled therapy at [facility].   Writer attended  telephonic interdisciplinary team meeting to assess  disposition needs and transition plan for resident.   Facility reports member lived alone prior. Has supportive son. Facility states member's son [name] is primary contact.   Will plan outreach to discuss [facility] follow up.   [name], MSN-Ed, RN,BSN [facility] Post Acute Care Coordinator [phone] [facility]) [phone]  (Toll free office)

## 2019-03-10 DIAGNOSIS — U071 COVID-19: Secondary | ICD-10-CM | POA: Diagnosis not present

## 2019-03-10 DIAGNOSIS — J1281 Pneumonia due to SARS-associated coronavirus: Secondary | ICD-10-CM | POA: Diagnosis not present

## 2019-03-10 DIAGNOSIS — I2510 Atherosclerotic heart disease of native coronary artery without angina pectoris: Secondary | ICD-10-CM | POA: Diagnosis not present

## 2019-03-10 DIAGNOSIS — F331 Major depressive disorder, recurrent, moderate: Secondary | ICD-10-CM | POA: Diagnosis not present

## 2019-03-11 ENCOUNTER — Other Ambulatory Visit: Payer: Self-pay | Admitting: *Deleted

## 2019-03-11 DIAGNOSIS — J1282 Pneumonia due to coronavirus disease 2019: Secondary | ICD-10-CM | POA: Diagnosis not present

## 2019-03-11 DIAGNOSIS — U071 COVID-19: Secondary | ICD-10-CM | POA: Diagnosis not present

## 2019-03-11 DIAGNOSIS — K580 Irritable bowel syndrome with diarrhea: Secondary | ICD-10-CM | POA: Diagnosis not present

## 2019-03-11 DIAGNOSIS — F331 Major depressive disorder, recurrent, moderate: Secondary | ICD-10-CM | POA: Diagnosis not present

## 2019-03-11 DIAGNOSIS — I10 Essential (primary) hypertension: Secondary | ICD-10-CM | POA: Diagnosis not present

## 2019-03-11 DIAGNOSIS — I2510 Atherosclerotic heart disease of native coronary artery without angina pectoris: Secondary | ICD-10-CM | POA: Diagnosis not present

## 2019-03-11 DIAGNOSIS — Z955 Presence of coronary angioplasty implant and graft: Secondary | ICD-10-CM | POA: Diagnosis not present

## 2019-03-11 DIAGNOSIS — M1990 Unspecified osteoarthritis, unspecified site: Secondary | ICD-10-CM | POA: Diagnosis not present

## 2019-03-11 DIAGNOSIS — I739 Peripheral vascular disease, unspecified: Secondary | ICD-10-CM | POA: Diagnosis not present

## 2019-03-11 DIAGNOSIS — K219 Gastro-esophageal reflux disease without esophagitis: Secondary | ICD-10-CM | POA: Diagnosis not present

## 2019-03-11 DIAGNOSIS — J9601 Acute respiratory failure with hypoxia: Secondary | ICD-10-CM | POA: Diagnosis not present

## 2019-03-11 NOTE — Patient Outreach (Signed)
Screened for potential [facility] Care Management needs as a benefit of  [employer] Medicare.  Writer attended  telephonic interdisciplinary team meeting to assess for disposition needs and transition plan for resident.   Facility [employer] planner reports Melissa Bentley transitioned home on yesterday 03/10/19 with [facility]. Member lives alone with son that assists.   Will make referral for [facility] Care Management RNCM.   Melissa Bentley has medical history of COVID-19, CAD, HLD, GERD.   [name], MSN-Ed, RN,BSN [facility] Post Acute Care Coordinator [phone] [facility]) [phone]  (Toll free office)

## 2019-03-12 ENCOUNTER — Other Ambulatory Visit: Payer: Self-pay | Admitting: *Deleted

## 2019-03-12 ENCOUNTER — Encounter: Payer: Self-pay | Admitting: *Deleted

## 2019-03-12 DIAGNOSIS — J1282 Pneumonia due to coronavirus disease 2019: Secondary | ICD-10-CM | POA: Diagnosis not present

## 2019-03-12 DIAGNOSIS — J9601 Acute respiratory failure with hypoxia: Secondary | ICD-10-CM | POA: Diagnosis not present

## 2019-03-12 DIAGNOSIS — U071 COVID-19: Secondary | ICD-10-CM | POA: Diagnosis not present

## 2019-03-12 DIAGNOSIS — I2510 Atherosclerotic heart disease of native coronary artery without angina pectoris: Secondary | ICD-10-CM | POA: Diagnosis not present

## 2019-03-12 DIAGNOSIS — K580 Irritable bowel syndrome with diarrhea: Secondary | ICD-10-CM | POA: Diagnosis not present

## 2019-03-12 DIAGNOSIS — I10 Essential (primary) hypertension: Secondary | ICD-10-CM | POA: Diagnosis not present

## 2019-03-12 NOTE — Patient Outreach (Signed)
[facility]) Care Management  03/12/2019  Melissa Bentley 13-Jun-1927 [id number]   [facility] Transition of Care Referral-snf (skilled nursing facility)  on APL  Referral Date: 03/11/19 Referral Source:  [name], MSN-Ed, RN,BSN-[facility] Post Acute Care [phone]  discharged from snf to home with [facility] home health. Lives alone with son that assist.  Transitioned from SNF prior to writer's outreach. Please call with questions.  Date of Admission: transferred from [facility] hospital 2 week stay in snf Diagnosis: Recent COVID-19, CAD, HLD, GERD Date of Discharge: on 03/10/19 Facility:  [facility] SNF Insurance: NextGen Medicare, [employer]   Outreach attempt #1 successful a the home number Patient is able to verify Patient is able to verify HIPAA [facility] Portability and Accountability Act) identifiers, date of birth (DOB) and address Reviewed and addressed Transitional of care referral with patient  Consent: [facility] ([facility] Customer service manager) RN CM reviewed [facility] services with patient. Patient gave verbal consent for services.  Melissa Bentley reports she is doing better She confirms her discharge from [facility] after being transferred from [facility] after a fall at home that resulted in a chronic subdural hematoma. She reports having 2 "brain surgeries in two days" She states she had 25-30 staples in her head She reports returning home to her home that is being remodeled. She reports prior to her admission she had her home roof repaired.  She confirms she was given a red notebook with various discharge information upon discharge from the [facility].   She denies all assessed covid symptoms today. 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 (she reports she does "better when reading people's lips") Falls "too many" in the last 3 months  DME rolling walker, glasses  Medications: Noted some changes in medication list (metoprolol 25 mg a daily and trazodone 100 mg every night) with review encouraged her to have her son take the snf list to [facility] drug store to have her medications re packaged as she gets pill packaging  She picks up or has here pill packaged medicines picked up not delivered from the [place] pharmacy  [facility] RN CM called [facility] [phone] and spoke with Cassie pill packing staff about the voiced changes pt voiced Cassie will call patient to review medicines also   Appointments: 03/17/19 primary care provider (PCP) Dr Cornelia Copa Follow up (f/u) visit    Advance directives: Denies need for assist with advance directives No  POA  Consent: [facility] RN CM reviewed [facility] services with patient. Patient gave verbal consent for services. Advised patient that other post discharge calls may occur to assess how the patient is doing following the recent hospitalization. Patient  voiced understanding and was appreciative of f/u call.  Plan: [facility] RN CM will follow up with Melissa Bentley in the next 7-10 business days Pt encouraged to return a call to [facility] RN CM prn  [facility] RN CM sent a successful outreach letter as discussed with [facility] brochure enclosed for review  Routed note to MDs/NP/PA  [facility] CM Care Plan Problem One     Most Recent Value  Care Plan Problem One  Risk for hospital readmission secondary to recently diagnosed chronic subdural hematoma  Role Documenting the Problem One  Care Management Telephonic Coordinator  Care Plan for Problem One  Active  [facility]N Long Term Goal   Over the next 31 days, patient will not experience hospital readmission, as evidence by patient reporting and review of EMR during [facility] RN CCM outreach  [place] Term Goal Start Date  03/12/19  Interventions for Problem One Long Term Goal  Encouraged attending schedule hospital follow up appointments, transition of care assessment completed, needs identified, reviewed medications, call to [place] to discuss changes in medications  THN CM Short Term Goal #1   Over the next 7 days, patient will schedule hospital follow up office visit appointments as evidenced by patient reporting during [facility] RN CCM outreach  [facility] CM Short Term Goal #1 Start Date  03/12/19  Interventions for Short Term Goal #1  Encouraged attending schedule hospital follow up appointments, transition of care assessment completed, needs identified, reviewed medications,       Tamirra Sienkiewicz L. Lavina Hamman, RN, BSN, [place] Management Care Coordinator Direct Number [phone] Mobile number [phone]  Main [facility] number [phone] Fax number [phone]

## 2019-03-16 ENCOUNTER — Other Ambulatory Visit: Payer: Self-pay

## 2019-03-16 ENCOUNTER — Other Ambulatory Visit: Payer: Self-pay | Admitting: *Deleted

## 2019-03-16 DIAGNOSIS — R946 Abnormal results of thyroid function studies: Secondary | ICD-10-CM | POA: Diagnosis not present

## 2019-03-16 DIAGNOSIS — Z6820 Body mass index (BMI) 20.0-20.9, adult: Secondary | ICD-10-CM | POA: Diagnosis not present

## 2019-03-16 DIAGNOSIS — U071 COVID-19: Secondary | ICD-10-CM | POA: Diagnosis not present

## 2019-03-16 DIAGNOSIS — J1282 Pneumonia due to coronavirus disease 2019: Secondary | ICD-10-CM | POA: Diagnosis not present

## 2019-03-16 NOTE — Patient Outreach (Signed)
[facility]) Care Management  03/16/2019  ETTER ROYALL Jan 18, 1928 [id number]   EMMI-general discharge RED ON EMMI ALERT Day # 1 Date: Friday 01/09/20 1303 Red Alert Reason: Got discharge papers? I Don't Know  Insurance: NextGen Medicare, AARP  [facility] admissions x 0 ED visits x 0 in the last 6 months  Last admission at [facility] prior to stay at [place] [place] and a discharge home on 03/10/19    Mrs Bardon was reached on 03/12/19 for a transition of care referral after her discharge from [facility] (referral from [facility] post acute care coordinator, Harl Favor   EMMI:   This EMMI red alert was addressed on 03/12/19 1853 and on today 03/16/19 1808 [facility] RN CM completed week 2 follow up please refer to other 03/16/19 [facility] RN CM note  Mrs Remus is very hard of hearing and reports she does better with hearing with face to face communication or when others are present to assist her, therefore may have had difficulty with the EMMI outreach on Friday 03/12/19 at 1303  On 03/21/19 she confirmed having her red book of discharge instructions from the [facility] and was able to go over them with [facility] RN CM The EMMI red flag response is incorrect   Consent: [facility] RN CM reviewed [facility] services with patient. Patient gave verbal consent for services [facility] telephonic RN CM.   Advised patient that there will be further automated EMMI- post discharge calls to assess how the patient is doing following the recent hospitalization Advised the patient that another call may be received from a nurse if any of their responses were abnormal. Patient voiced understanding and was appreciative of f/u call.   Plan: [facility] RN CM will follow up with Mrs Chopra in the next 7-10 business days Pt encouraged to return a call to [facility] RN CM prn   Cala Bradford L. Noelle Penner, RN, BSN, CCM [facility] Telephonic Care Management Care Coordinator Office number [phone] Mobile number [phone]  Main [facility] number [phone] Fax number [phone]

## 2019-03-16 NOTE — Patient Outreach (Signed)
Melissa Bentley  03/16/2019  Melissa Bentley 1928-01-25 [id number]  [facility] Transition of Care Referral-snf (skilled nursing facility) on APL  Referral Date: 03/11/19 Referral Source:  Melissa Rolling, MSN-Ed, Melissa Bentley,Melissa Bentley [place]  discharged from snf to home with [facility] home health. Lives alone with son that assist.  Transitioned from SNF prior to writer's outreach. Please call with questions.  Date of Admission: transferred from [place] 2 week stay in snf Diagnosis: Recent COVID-19, CAD, HLD, GERD Date of Discharge: on 03/10/19 Facility:  [place]: [employer] Medicare, [place] week #2  successful a the home number  Patient is able to verify Patient is able to verify HIPAA (Melissa Bentley) identifiers, date of birth (DOB) and address Reviewed and addressed Transitional of care referralwith patient  Consent: [facility]) Melissa Bentley CM reviewed [facility] services with patient. Patient gave verbal consent for services.  Melissa Bentley reports she is doing better She is still working in her home re arranging things as she continues to plan on remodeling She reports her youngest son and his girlfriend are caring for her today  They assisted with a visit to Dr Quillian Quince, primary care provider (PCP) She reports Dr Quillian Quince reconciled her medicines and renewed them after she carried her red book from the [place] [facility] skilled nursing facility (snf) in with her. 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 (she reports she does "better when reading people's lips") Falls "too many" in the last 3 months  DME Bentley walker, glasses  Plan: [facility] Melissa Bentley CM will follow up with Melissa Bentley in the next 7-10 business days Pt encouraged to return a call to [facility] Melissa Bentley CM prn  [facility] CM Care Plan Problem One     Most Recent Value  Care Plan Problem One  Risk for hospital readmission secondary to recently diagnosed chronic subdural hematoma  Role Documenting the Problem One  Care Bentley Telephonic Coordinator  Care Plan for Problem One  Active  THN Long Term Goal   Over the next 31 days, patient will not experience hospital readmission, as evidence by patient reporting and review of EMR during [facility] Melissa Bentley CCM outreach  [facility] Long Term Goal Start Date  03/12/19  Interventions for Problem One Long Term Goal  Assessed for medical and care coordination needs/concerns, encouraged to follow treatment plan and to be careful related to potential falls   THN CM Short Term Goal #1   Over the next 7 days, patient will schedule hospital follow up office visit appointments  as evidenced by patient reporting during [place] outreach  [facility] CM Short Term Goal #1 Start Date  03/12/19  [facility] CM Short Term Goal #1 Met Date  03/16/19     Joelene Millin L. Lavina Hamman, Melissa Bentley, BSN, [place] Coordinator Office number [phone] Mobile number [phone]  Main THN number  [phone] Fax number [phone]

## 2019-03-23 ENCOUNTER — Ambulatory Visit: Payer: Self-pay | Admitting: *Deleted

## 2019-03-23 DIAGNOSIS — I2510 Atherosclerotic heart disease of native coronary artery without angina pectoris: Secondary | ICD-10-CM | POA: Diagnosis not present

## 2019-03-23 DIAGNOSIS — J9601 Acute respiratory failure with hypoxia: Secondary | ICD-10-CM | POA: Diagnosis not present

## 2019-03-23 DIAGNOSIS — K580 Irritable bowel syndrome with diarrhea: Secondary | ICD-10-CM | POA: Diagnosis not present

## 2019-03-23 DIAGNOSIS — J1282 Pneumonia due to coronavirus disease 2019: Secondary | ICD-10-CM | POA: Diagnosis not present

## 2019-03-23 DIAGNOSIS — U071 COVID-19: Secondary | ICD-10-CM | POA: Diagnosis not present

## 2019-03-23 DIAGNOSIS — I10 Essential (primary) hypertension: Secondary | ICD-10-CM | POA: Diagnosis not present

## 2019-03-24 ENCOUNTER — Other Ambulatory Visit: Payer: Self-pay | Admitting: *Deleted

## 2019-03-24 ENCOUNTER — Other Ambulatory Visit: Payer: Self-pay

## 2019-03-24 DIAGNOSIS — B948 Sequelae of other specified infectious and parasitic diseases: Secondary | ICD-10-CM | POA: Diagnosis not present

## 2019-03-24 NOTE — Patient Outreach (Signed)
[facility]) Care Management  03/24/2019  Melissa Bentley 08/25/1927 [id number]   THN Transition of Care Referral-snf (skilled nursing facility) week 3  on APL  Referral Date:03/11/19 Referral Source:Atika [place] Crane, MSN-Ed, RN,BSN-THN Post Acute Care Coordinator-[phone]  discharged [facility]sn[facility] to [facility] health. Lives alonewith son that assist. Transitioned from skilled nursing facility (snf) prior to [employer]. Please call with questions.  Date of Admission to snf: 02/24/19 transferred from [place] 2 week stay in snf per pt Diagnosis:Recent COVID-19,CAD, HLD, GERD Date of Discharge home:on 03/10/19 [place] SN[facility] Insurance:[employer] Medicare, [place] week #3 transition of care successful Melissa Bentley has been home for 14 days from the snf  Pt remains Hard of hearing [facility])   Patient is able to verify Patient is able to verify HIPAA ([place]) identifiers, date of birth (DOB) andaddress Reviewed reason for the call as a Transitional of care follow up call with patient  Consent: [facility]) RN CM reviewed [facility] services with patient. Patient gave verbal consent for services.   Today Melissa Bentley is reporting some symptoms that has not been reported to her MD  Assessed for covid and general symptoms She reports she has experienced dehydration (lips dry even with increased fluids), chills at intervals increased swelling of both lower extremities, urinary incontinence, runny nose, nose bleed x one in last week, diarrhea that has stopped in the last few days, increase voiding some loss of taste (she reports changing her coffee type for cough reasons) and some shortness of breath at intervals. She denies a fever, an elevation in heart rate or oxygen saturation, a cough, loss of smell, sore throat, and a headache She confirms she is  following covid precautions by wearing masks when she has visitors, maintaining social distancing and washing her hands. She denies traveling or contact with someone she is aware that is covid positive.  She is not listed to be on a diuretic Reports incontinence mainly at night but sometimes during the day Reports wears a pad at night Reports a history (hx) of urinary tract infection (UTI) She continues to have [facility] home health visits and confirms she is to be seen today about 2 pm by the home health [facility]) Physical therapy (PT)  THN RN CM called Dr Ar[place]a Jansky office to speak with his nurse about Melissa Bentley signs and symptoms (s/s). 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 RN CM prn Routed note to MD  [facility] MD involvement barriers letter sent to Dr Dub Amis  [facility] CM Care Plan Problem One     Most Recent Value  Care Plan Problem One  Risk for hospital readmission  secondary to recently diagnosed chronic subdural hematoma  Role Documenting the Problem One  Care Management [place] for Problem One  Active  THN Long Term Goal   Over the next 31 days, patient will not experience hospital readmission, as evidence by patient reporting and review of EMR during [facility] RN CCM outreach  [place] Term Goal Start Date  03/12/19  Interventions for Problem One Long Term Goal  assessed for worsening symptoms, left message at Dr Quillian Quince office with Rodena Piety  [facility] CM Short Term Goal #1   Over the next 7 days, patient will schedule hospital follow up office visit appointments as evidenced by patient reporting during [facility] RN CCM outreach  [facility] CM Short Term Goal #1 Start Date  03/12/19  [facility] CM Short Term Goal #1 Met Date  03/16/19      Joelene Millin L. Lavina Hamman, RN, BSN, [place] Coordinator Office number [phone] Mobile number [phone]  Main THN number [phone] Fax number [phone]

## 2019-03-24 NOTE — Patient Outreach (Addendum)
[facility]) Care Management  03/24/2019  Melissa Bentley 1927-03-02 [id number]   [facility] Care coordination- return call to follow possible contact from primary care provider (PCP)   [facility] RN CM returned a call to Melissa Bentley to see if she had contact from Dr Reuel Boom office  No answer. THN RN CM left HIPAA [facility] Portability and Accountability Act) compliant voicemail message along with CM's contact info.   Plans [facility] RN CM will follow up with Melissa Bentley within the next 7-14 days  Pt encouraged to return a call to [facility] RN CM prn  Melissa Bradford L. Noelle Penner, RN, BSN, CCM [facility] Telephonic Care Management Care Coordinator Office number [phone] Mobile number [phone]  Main THN number [phone] Fax number [phone]

## 2019-03-25 DIAGNOSIS — R300 Dysuria: Secondary | ICD-10-CM | POA: Diagnosis not present

## 2019-03-25 DIAGNOSIS — R6883 Chills (without fever): Secondary | ICD-10-CM | POA: Diagnosis not present

## 2019-03-26 DIAGNOSIS — E7849 Other hyperlipidemia: Secondary | ICD-10-CM | POA: Diagnosis not present

## 2019-03-26 DIAGNOSIS — I10 Essential (primary) hypertension: Secondary | ICD-10-CM | POA: Diagnosis not present

## 2019-03-30 ENCOUNTER — Other Ambulatory Visit: Payer: Self-pay | Admitting: *Deleted

## 2019-03-30 ENCOUNTER — Other Ambulatory Visit: Payer: Self-pay

## 2019-03-30 ENCOUNTER — Encounter: Payer: Self-pay | Admitting: *Deleted

## 2019-03-30 NOTE — Patient Outreach (Signed)
[facility]) Care Management  03/30/2019  Melissa Bentley [birth year] [id number]     THN Transition of Care Referral-snf (skilled nursing facility) week 4  on APL  Referral Date:03/11/19 Referral Source:Melissa Nevada Crane, MSN-Ed, RN,BSN-THN Post Acute Care Coordinator-[phone]  [facility]. Lives alonewith son that assist. Transitioned from skilled nursing facility (snf) prior to [employer]. Please call with questions.  Date of Admission to snf: 02/24/19 transferred from [place] 2 week stay in snf per pt Diagnosis:Recent COVID-19,CAD, HLD, GERD Date of Discharge home:on 03/10/19 [place] [facility] Insurance:[employer] Medicare, [place] week #3 transition of care successful Melissa Bentley has been home for 19 days from the snf  Pt remains Hard of hearing [facility])   Patient is able to verify Patient is able to verify HIPAA ([place]) identifiers, date of birth (DOB) andaddress  Consent: [facility]) RN CM reviewed [facility] services with patient. Patient gave verbal consent for services.  Resolving worsening symptoms On 2/24/21She reported experiencing dehydration (lips dry even with increased fluids), chills at intervals increased swelling of both lower extremities, urinary incontinence, runny nose, nose bleed x one in last week, diarrhea that has stopped in the last few days, increase voiding some loss of taste (she reports changing her coffee type for cough reasons) and some shortness of breath at intervals. She denied a fever, an elevation in heart rate or oxygen saturation, a cough, loss of smell, sore throat, and a headache She reported incontinence mainly at night but sometimes during the day Reported wears a pad at night Reported a history (hx) of urinary tract infection (UTI) THN RN CM had called Dr Quillian Quince to  report the symptoms to his Nurse, Melissa Bentley  Melissa Bentley confirms she did receive a return call from the MD office, urine was taken to the office by her son and she is now on prednisone and medication for her overactive bladder. Her urine test did not indicate a bladder infection  THN RN CM discussed an over the counter product (AZO) that can be used to identify urinary tract infection (UTI) in the future She agreed to [facility] educational materials for UTIs and neurogenic bladder to be mailed to her address and sent via e mail  Melissa Bentley continues to be hard of hearing but active in her home with household chores as she reports she was in the middle of  She reports going outside on 03/29/19 with her wheelchair (w/c) to pick up her grabage cans without concerns Safety and clustering activities so others can assist her encourage to prevent risks of falls She voiced understanding  Social:Melissa 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 Her son, Melissa "[name]" (eldest son) and her grand daughter assists with food, house keeping and transportation to medical appointments She has support of her sons, daughter in law and 2 grad daughters she "raised" She does not drive anymore since her fall and subdural hematoma (SDH) in 2019  Conditions:Hypertension (HTN), hypothyroidism,Hyperlipidemia (HLD), chronic subdural hematoma, chronic diarrhea, diverticulosis of colon without hemorrhage,GERD (gastroesophageal reflux disease), diarrhea, pressure injury of skin, history of positive covid test Pt reports a Heart attack in 2001, She reports 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 and 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 (she reports she does "better when reading people's lips") Falls "too many" in the last 3 months, had a fall before her subdural hematoma (SDH)in 2019  DMErolling walker, glasses, pulse oximeter,hearing aid but it "does not work right" (she has a letter related to it but is  awaiting coordination with her sons to get her to the facility)  Plans [facility] RN CM will follow up with Melissa Buechler within the next 21-28 business days  Pt encouraged to return a call to [facility] RN CM prn  Sent EMMI materials on neurogenic bladder in adults and urinary tract infections in adults (also noted it was sent to an email address listed in EPIC)  Routed note to MD  [place] Problem One     Most Recent Value  Care Plan Problem One  Risk for hospital readmission secondary to recently diagnosed chronic subdural hematoma  Role Documenting the Problem One  Care Management Telephonic Coordinator  Care Plan for Problem One  Active  THN Long Term Goal   Over the next 31 days, patient will not experience hospital readmission, as evidence by patient reporting and review of EMR during [facility] RN CCM outreach  [place] Term Goal Start Date  03/12/19  Interventions for Problem One Long Term Goal  assessed for any worsing symptoms and improvement of other symptoms, She agreed to [facility] educational materials for UTIs and neurogenic bladder to be mailed to her address and sent via e mail  [facility] CM Short Term Goal #1   Over the next 7 days, patient will schedule hospital follow up office visit appointments as evidenced by patient reporting during [facility] RN CCM outreach  [facility] CM Short Term Goal #1 Start Date  03/12/19  [facility] CM Short Term Goal #1 Met Date  03/16/19      Joelene Millin L. Lavina Hamman, RN, BSN, [place] Coordinator Office number [phone] Mobile number [phone]  Main THN number [phone] Fax number [phone]

## 2019-04-01 DIAGNOSIS — J9601 Acute respiratory failure with hypoxia: Secondary | ICD-10-CM | POA: Diagnosis not present

## 2019-04-01 DIAGNOSIS — J1282 Pneumonia due to coronavirus disease 2019: Secondary | ICD-10-CM | POA: Diagnosis not present

## 2019-04-01 DIAGNOSIS — I10 Essential (primary) hypertension: Secondary | ICD-10-CM | POA: Diagnosis not present

## 2019-04-01 DIAGNOSIS — U071 COVID-19: Secondary | ICD-10-CM | POA: Diagnosis not present

## 2019-04-01 DIAGNOSIS — I2510 Atherosclerotic heart disease of native coronary artery without angina pectoris: Secondary | ICD-10-CM | POA: Diagnosis not present

## 2019-04-01 DIAGNOSIS — K580 Irritable bowel syndrome with diarrhea: Secondary | ICD-10-CM | POA: Diagnosis not present

## 2019-04-07 DIAGNOSIS — I10 Essential (primary) hypertension: Secondary | ICD-10-CM | POA: Diagnosis not present

## 2019-04-07 DIAGNOSIS — K580 Irritable bowel syndrome with diarrhea: Secondary | ICD-10-CM | POA: Diagnosis not present

## 2019-04-07 DIAGNOSIS — I2510 Atherosclerotic heart disease of native coronary artery without angina pectoris: Secondary | ICD-10-CM | POA: Diagnosis not present

## 2019-04-07 DIAGNOSIS — U071 COVID-19: Secondary | ICD-10-CM | POA: Diagnosis not present

## 2019-04-07 DIAGNOSIS — J1282 Pneumonia due to coronavirus disease 2019: Secondary | ICD-10-CM | POA: Diagnosis not present

## 2019-04-07 DIAGNOSIS — J9601 Acute respiratory failure with hypoxia: Secondary | ICD-10-CM | POA: Diagnosis not present

## 2019-04-08 DIAGNOSIS — I2510 Atherosclerotic heart disease of native coronary artery without angina pectoris: Secondary | ICD-10-CM | POA: Diagnosis not present

## 2019-04-08 DIAGNOSIS — I10 Essential (primary) hypertension: Secondary | ICD-10-CM | POA: Diagnosis not present

## 2019-04-08 DIAGNOSIS — U071 COVID-19: Secondary | ICD-10-CM | POA: Diagnosis not present

## 2019-04-08 DIAGNOSIS — J1282 Pneumonia due to coronavirus disease 2019: Secondary | ICD-10-CM | POA: Diagnosis not present

## 2019-04-08 DIAGNOSIS — J9601 Acute respiratory failure with hypoxia: Secondary | ICD-10-CM | POA: Diagnosis not present

## 2019-04-08 DIAGNOSIS — K580 Irritable bowel syndrome with diarrhea: Secondary | ICD-10-CM | POA: Diagnosis not present

## 2019-04-09 DIAGNOSIS — Z6820 Body mass index (BMI) 20.0-20.9, adult: Secondary | ICD-10-CM | POA: Diagnosis not present

## 2019-04-09 DIAGNOSIS — U071 COVID-19: Secondary | ICD-10-CM | POA: Diagnosis not present

## 2019-04-09 DIAGNOSIS — J1282 Pneumonia due to coronavirus disease 2019: Secondary | ICD-10-CM | POA: Diagnosis not present

## 2019-04-10 DIAGNOSIS — J9601 Acute respiratory failure with hypoxia: Secondary | ICD-10-CM | POA: Diagnosis not present

## 2019-04-10 DIAGNOSIS — M1990 Unspecified osteoarthritis, unspecified site: Secondary | ICD-10-CM | POA: Diagnosis not present

## 2019-04-10 DIAGNOSIS — U071 COVID-19: Secondary | ICD-10-CM | POA: Diagnosis not present

## 2019-04-10 DIAGNOSIS — I10 Essential (primary) hypertension: Secondary | ICD-10-CM | POA: Diagnosis not present

## 2019-04-10 DIAGNOSIS — I739 Peripheral vascular disease, unspecified: Secondary | ICD-10-CM | POA: Diagnosis not present

## 2019-04-10 DIAGNOSIS — Z955 Presence of coronary angioplasty implant and graft: Secondary | ICD-10-CM | POA: Diagnosis not present

## 2019-04-10 DIAGNOSIS — J1282 Pneumonia due to coronavirus disease 2019: Secondary | ICD-10-CM | POA: Diagnosis not present

## 2019-04-10 DIAGNOSIS — I2510 Atherosclerotic heart disease of native coronary artery without angina pectoris: Secondary | ICD-10-CM | POA: Diagnosis not present

## 2019-04-10 DIAGNOSIS — F331 Major depressive disorder, recurrent, moderate: Secondary | ICD-10-CM | POA: Diagnosis not present

## 2019-04-10 DIAGNOSIS — K580 Irritable bowel syndrome with diarrhea: Secondary | ICD-10-CM | POA: Diagnosis not present

## 2019-04-10 DIAGNOSIS — K219 Gastro-esophageal reflux disease without esophagitis: Secondary | ICD-10-CM | POA: Diagnosis not present

## 2019-04-14 DIAGNOSIS — U071 COVID-19: Secondary | ICD-10-CM | POA: Diagnosis not present

## 2019-04-14 DIAGNOSIS — I2510 Atherosclerotic heart disease of native coronary artery without angina pectoris: Secondary | ICD-10-CM | POA: Diagnosis not present

## 2019-04-14 DIAGNOSIS — I10 Essential (primary) hypertension: Secondary | ICD-10-CM | POA: Diagnosis not present

## 2019-04-14 DIAGNOSIS — J9601 Acute respiratory failure with hypoxia: Secondary | ICD-10-CM | POA: Diagnosis not present

## 2019-04-14 DIAGNOSIS — J1282 Pneumonia due to coronavirus disease 2019: Secondary | ICD-10-CM | POA: Diagnosis not present

## 2019-04-14 DIAGNOSIS — K580 Irritable bowel syndrome with diarrhea: Secondary | ICD-10-CM | POA: Diagnosis not present

## 2019-04-19 ENCOUNTER — Ambulatory Visit (INDEPENDENT_AMBULATORY_CARE_PROVIDER_SITE_OTHER): Payer: Medicare Other | Admitting: Cardiovascular Disease

## 2019-04-19 ENCOUNTER — Other Ambulatory Visit: Payer: Self-pay

## 2019-04-19 ENCOUNTER — Encounter: Payer: Self-pay | Admitting: Cardiovascular Disease

## 2019-04-19 VITALS — BP 118/58 | HR 52 | Ht 60.0 in | Wt 103.0 lb

## 2019-04-19 DIAGNOSIS — I779 Disorder of arteries and arterioles, unspecified: Secondary | ICD-10-CM | POA: Diagnosis not present

## 2019-04-19 DIAGNOSIS — I25118 Atherosclerotic heart disease of native coronary artery with other forms of angina pectoris: Secondary | ICD-10-CM

## 2019-04-19 DIAGNOSIS — I10 Essential (primary) hypertension: Secondary | ICD-10-CM | POA: Diagnosis not present

## 2019-04-19 DIAGNOSIS — R55 Syncope and collapse: Secondary | ICD-10-CM

## 2019-04-19 DIAGNOSIS — I480 Paroxysmal atrial fibrillation: Secondary | ICD-10-CM | POA: Diagnosis not present

## 2019-04-19 DIAGNOSIS — I739 Peripheral vascular disease, unspecified: Secondary | ICD-10-CM

## 2019-04-19 NOTE — Progress Notes (Signed)
SUBJECTIVE: Melissa Bentley is a 86 y.o. female with a history of syncope/near syncope, coronary artery disease (s/p RCA stent), and peripheral vascular disease and has a right subclavian artery stent. Past medical history includes multiple falls and chronic subdural hematoma.   She is here with her son.  She denies chest pain, dizziness, falls, and palpitations.  Chronic exertional dyspnea stable.  She had apparently been having problems with left ankle and foot swelling but this has since resolved.  Her appetite is good.    Review of Systems: As per "subjective", otherwise negative.  No Known Allergies  Current Outpatient Medications  Medication Sig Dispense Refill  . aspirin 81 MG tablet Take 81 mg by mouth daily. Reported on 04/27/2015    . atorvastatin (LIPITOR) 40 MG tablet Take 40 mg by mouth daily at 6 PM.     . cilostazol (PLETAL) 100 MG tablet Take 100 mg by mouth 2 (two) times daily.     . citalopram (CELEXA) 20 MG tablet Take 20 mg by mouth daily.     Marland Kitchen levothyroxine (SYNTHROID) 50 MCG tablet Take 50 mcg by mouth daily.    . metoprolol tartrate (LOPRESSOR) 25 MG tablet 25 mg 2 (two) times daily.     . pantoprazole (PROTONIX) 40 MG tablet Take 40 mg by mouth daily.    . traZODone (DESYREL) 50 MG tablet Take 100 mg by mouth at bedtime.     No current facility-administered medications for this visit.    Past Medical History:  Diagnosis Date  . CAD (coronary artery disease)   . Depression   . GERD (gastroesophageal reflux disease)   . Hyperlipidemia   . Hypertension   . Hypothyroidism   . MI (myocardial infarction) (HCC)   . Osteoporosis   . Psoriasis   . PVD (peripheral vascular disease) (HCC)     Past Surgical History:  Procedure Laterality Date  . ABDOMINAL HYSTERECTOMY    . CATARACT EXTRACTION Bilateral   . COLONOSCOPY N/A 02/01/2015   Dr. Rourk:colonic diverticulosis s/p biopsy with unremarkable colonic mucosa, no microscopic colitis.   .  CORONARY ANGIOPLASTY WITH STENT PLACEMENT    . CRANIOTOMY Right 10/03/2017   Procedure: CRANIOTOMY HEMATOMA EVACUATION SUBDURAL;  Surgeon: Coletta Memos, MD;  Location: [facility] OR;  Service: Neurosurgery;  Laterality: Right;  . CRANIOTOMY Right 10/05/2017   Procedure: REDO CRANIOTOMY HEMATOMA EVACUATION SUBDURAL;  Surgeon: Coletta Memos, MD;  Location: [facility] OR;  Service: Neurosurgery;  Laterality: Right;  . EYE SURGERY    . LUNG LOBECTOMY    . SUBCLAVIAN ARTERY STENT    . TUBAL LIGATION      Social History   Socioeconomic History  . Marital status: Widowed    Spouse name: Not on file  . Number of children: 2  . Years of education: colllege education  . Highest education level: Associate degree: occupational, Scientist, product/process development, or vocational program  Occupational History  . Occupation: retired Designer, industrial/product    Comment: disabled   Tobacco Use  . Smoking status: Former Smoker    Quit date: 01/17/1996    Years since quitting: 23.2  . Smokeless tobacco: Never Used  Substance and Sexual Activity  . Alcohol use: No    Alcohol/week: 0.0 standard drinks  . Drug use: No  . Sexual activity: Not on file  Other Topics Concern  . Not on file  Social History Narrative  . Not on file   Social Determinants of Health   Financial Resource Strain:  Low Risk   . Difficulty of Paying Living Expenses: Not hard at all  Food Insecurity: No Food Insecurity  . Worried About Charity fundraiser in the Last Year: Never true  . Ran Out of Food in the Last Year: Never true  Transportation Needs: No Transportation Needs  . Lack of Transportation (Medical): No  . Lack of Transportation (Non-Medical): No  Physical Activity:   . Days of Exercise per Week:   . Minutes of Exercise per Session:   Stress:   . Feeling of Stress :   Social Connections: Slightly Isolated  . Frequency of Communication with Friends and Family: More than three times a week  . Frequency of Social Gatherings with Friends and Family: More than three  times a week  . Attends Religious Services: 1 to 4 times per year  . Active Member of Clubs or Organizations: Yes  . Attends Archivist Meetings: 1 to 4 times per year  . Marital Status: Widowed  Intimate Partner Violence:   . Fear of Current or Ex-Partner:   . Emotionally Abused:   Marland Kitchen Physically Abused:   . Sexually Abused:     Orson Slick, LPN was present throughout the entirety of the encounter.  Vitals:   04/19/19 1543  BP: (!) 118/58  Pulse: (!) 52  SpO2: 97%  Weight: 103 lb (46.7 kg)  Height: 5' (1.524 m)    Wt Readings from Last 3 Encounters:  04/19/19 103 lb (46.7 kg)  08/05/18 108 lb (49 kg)  01/26/18 108 lb (49 kg)     PHYSICAL EXAM General: NAD HEENT: Normal. Neck: No JVD, no thyromegaly. Lungs: Clear to auscultation bilaterally with normal respiratory effort. CV: Regular rate and rhythm, normal S1/S2, no S3/S4, no murmur. No pretibial or periankle edema.  No carotid bruit.   Abdomen: Soft, nontender, no distention.  Neurologic: Alert and oriented.  Psych: Normal affect. Skin: Normal. Musculoskeletal: No gross deformities.      Labs: Lab Results  Component Value Date/Time   K 4.0 10/05/2017 01:25 PM   HGB 8.2 (L) 10/05/2017 01:25 PM     Lipids: No results found for: LDLCALC, LDLDIRECT, CHOL, TRIG, HDL     Prior CV studies:   The following studies were reviewed today:  Normal nuclear stress test on 02/06/2018, LVEF 85%.  Event monitor in January 2020 showed sinus rhythm with rare PACs and PVCs.  There was one episode of atrial fibrillation, 100 bpm.  Average heart rate was 60 bpm.    ASSESSMENT AND PLAN:  1. Syncope: No recurrence. Prior episode likely vasovagal in etiology and it is unclear if she had frank syncopal or near syncopal episodes at that time.  Nuclear stress test was normal.  Event monitoring showed nothing to explain syncope.  2. Coronary artery disease: History of coronary artery stent in 2001 at the time of  MI.  Normal nuclear stress test in January 2020.Currently on aspirin, atorvastatin, and Lopressor.  Symptomatically stable.  No changes to therapy.  3. Right carotid bruit: 1 to 39% bilateral internal carotid artery stenosis by Dopplers on 01/30/2018.  4. Hypertension: Controlled on present therapy. No changes.  5. Peripheral vascular disease: History of right subclavian artery stent. Asymptomatic from thisstandpoint. Continue aspirin and statin.  She is also on Pletal.  6.  Paroxysmal atrial fibrillation: Event monitoring in January 2020 showed 1 episode of atrial fibrillation, 100 bpm.  However, she is a poor candidate for systemic anticoagulation given frequent episodes of syncope,  falls, and subdural hematoma.  Continue aspirin and Lopressor.    Disposition: Follow up 6 months virtual visit   Prentice Docker, M.D., F.A.C.C.

## 2019-04-19 NOTE — Patient Instructions (Addendum)
Medication Instructions:  Continue all current medications.   Labwork: none  Testing/Procedures: none  Follow-Up: 6 months   Any Other Special Instructions Will Be Listed Below (If Applicable).   If you need a refill on your cardiac medications before your next appointment, please call your pharmacy.  

## 2019-04-20 ENCOUNTER — Other Ambulatory Visit: Payer: Self-pay | Admitting: *Deleted

## 2019-04-20 DIAGNOSIS — I2510 Atherosclerotic heart disease of native coronary artery without angina pectoris: Secondary | ICD-10-CM | POA: Diagnosis not present

## 2019-04-20 DIAGNOSIS — I10 Essential (primary) hypertension: Secondary | ICD-10-CM | POA: Diagnosis not present

## 2019-04-20 DIAGNOSIS — J1282 Pneumonia due to coronavirus disease 2019: Secondary | ICD-10-CM | POA: Diagnosis not present

## 2019-04-20 DIAGNOSIS — J9601 Acute respiratory failure with hypoxia: Secondary | ICD-10-CM | POA: Diagnosis not present

## 2019-04-20 DIAGNOSIS — K580 Irritable bowel syndrome with diarrhea: Secondary | ICD-10-CM | POA: Diagnosis not present

## 2019-04-20 DIAGNOSIS — U071 COVID-19: Secondary | ICD-10-CM | POA: Diagnosis not present

## 2019-04-20 NOTE — Patient Outreach (Signed)
[facility]) Care Management  04/20/2019  Melissa Bentley March 22, 1927 [id number]   [facility] outreach for complex care   Melissa Bentley was initially referred to [facility] on 03/11/19 by Pollyann Samples [facility] acute care coordinator after her discharge on 03/10/19 from [facility] for covid-19, Coronary Artery Disease (CAD),Hyperlipidemia (HLD), GERD (gastroesophageal reflux disease). [facility] Transition of care services are completed today and complex care will continue  On APL-Insurance:NextGen Medicare, [place] successful to her home number Melissa Bentley has been home for 42 days from the snf  Pt remainsHard of hearing [facility])   Patient is able to verify Patient is able to verify HIPAA ([place]) identifiers, date of birth (DOB) andaddress [facility] RN CM reviewed the purpose of the follow up to conclude transition of care services after snf discharge and further complex care assessment, care coordination needs   Consent: [facility]) RN CM reviewed [facility] services with patient. Patient gave verbal consent for services.  Transition of care services Melissa Bentley reports no further transition of care needs on today when assessed No issues with medications, appointments, DME and continue with home health services for home health [facility]) Physical therapy (PT) She confirms other [facility] home health services have concluded. She confirms a follow visit with her cardiology on 04/19/19 and receiving a "clean bill of health" She confirms she did receive the mailed EMMI materials on neurogenic bladder, UTI and the [facility] consent from [place]. She denies any questions As [facility] RN CM was outreaching to Melissa Bentley she reported the [facility] had arrived and the [facility] outreach was concluded.   Hypertension (HTN) Her BP remains within normal limits. Noted as 118/58 during her 04/19/19 cardiology visit Wt 103 BMI 20.12  Covid  She denies  covid signs and symptoms (s/s) today  She states she has not obtained the covid vaccine and will await the snf recommended "ninety days before I can get it" She states she calculated her 90 days will be on May 05 2019 She discussed her preferred vaccine type She voice reading that her local [place] may have availability to provide the covid vaccine but has not signed up [place] spoke with Verline Lema at [place] to confirm they do have availability to offer Melissa Bentley a Levan Hurst covid vaccine. Her name was added to the appointment list for an outreach on or after May 05 2019.   [facility] RN CM called to update Melissa Viveiros Melissa Carbonell will return a call to [facility] drug store to discuss a refill of her Lasix that Melissa Strub refers to as her medication for "overactive bladder problem"  She report she will have her son pick up Lasix or have [place] to deliver it They also provide prepackage medications for her    Social:Melissa Bentley is a 86 year old disabled widowed retired patient who was a [employer]ar[employer] manager for 43 years. She has an associate degree college education Her son, [name] (eldest son) and her grand daughter assists with food, house keeping and transportation to medical appointments She has support of her two sons, daughter in law and 2 grand daughters she "raised" She does not drive anymore since her fall and subdural hematoma (SDH)in 2019.    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  (she reports she does "better when reading people's lips"), former smoker quit 01/17/1996  Falls No fall since snf 03/10/19 discharge  "too many" in the last 3 months, had a fall before her subdural hematoma (SDH)in 2019  DMErolling walker, glasses, pulse oximeter,hearing aid but it "does not work right" (she has a letter related to it but is awaiting coordination with her sons to get her to the facility)  Plans [facility] RN CM will follow up with Melissa Bentley as discussed within the next   Pt encouraged to return a call to [facility] RN CM prn Routed note to MD  [facility] CM Care Plan Problem One     Most Recent Value  Care Plan Problem One  Risk for hospital readmission secondary to recently diagnosed chronic subdural hematoma  Role Documenting the Problem One  Care Management Telephonic Coordinator  Care Plan for Problem One  Active  THN Long Term Goal   Over the next 31 days, patient will not experience hospital readmission, as evidence by patient reporting and review of EMR during [facility] RN CCM outreach  [facility] Long Term Goal Start Date  03/12/19  [facility] Long Term Goal Met Date  04/20/19  THN CM Short Term Goal #1   Over the next 7 days, patient will schedule hospital follow up office visit appointments as evidenced by patient reporting during [facility] RN CCM outreach  [facility] CM Short Term Goal #1 Start Date  03/12/19  [facility] CM Short Term Goal #1 Met Date  03/16/19    [facility] CM Care Plan Problem Two     Most Recent Value  Care Plan Problem Two  knowledge deficit of home care for chronic subdural hematoma, HTN, fall prevention, covid  Role Documenting the Problem Two  Care Management Telephonic Coordinator  Care Plan for Problem Two  Active  Interventions for Problem Two Long Term Goal   assessed for home care of major medical issues, completed transition of care services continued complex care services  [facility] Long Term Goal  over the next 90 days patient will be able to verbalize during follow up outreach 2-3  interventions of action plan for home care of HTN, chronic subdural hematoma adn fall prevention  [facility]N Long Term Goal Start Date  04/20/19  [facility] CM Short Term Goal #1   over the next 30 days patient will receive her covid vaccine  [facility] CM Short Term Goal #1 Start Date  04/20/19  Interventions for Short Term Goal #2   assessed for covid vaccine, outreach to [place] drug about availabilty to provide covid vaccine, assisted by adding patient name plus to [place] covid appointment list for May 05 2019 outreach, updated patient       Joelene Millin L. Lavina Hamman, RN, BSN, [place] Coordinator Office number [phone] Mobile number [phone]  Main [facility] number [phone] Fax number [phone]

## 2019-04-22 DIAGNOSIS — J9601 Acute respiratory failure with hypoxia: Secondary | ICD-10-CM | POA: Diagnosis not present

## 2019-04-22 DIAGNOSIS — K580 Irritable bowel syndrome with diarrhea: Secondary | ICD-10-CM | POA: Diagnosis not present

## 2019-04-22 DIAGNOSIS — J1282 Pneumonia due to coronavirus disease 2019: Secondary | ICD-10-CM | POA: Diagnosis not present

## 2019-04-22 DIAGNOSIS — U071 COVID-19: Secondary | ICD-10-CM | POA: Diagnosis not present

## 2019-04-22 DIAGNOSIS — I2510 Atherosclerotic heart disease of native coronary artery without angina pectoris: Secondary | ICD-10-CM | POA: Diagnosis not present

## 2019-04-22 DIAGNOSIS — I10 Essential (primary) hypertension: Secondary | ICD-10-CM | POA: Diagnosis not present

## 2019-05-05 DIAGNOSIS — I2510 Atherosclerotic heart disease of native coronary artery without angina pectoris: Secondary | ICD-10-CM | POA: Diagnosis not present

## 2019-05-05 DIAGNOSIS — U071 COVID-19: Secondary | ICD-10-CM | POA: Diagnosis not present

## 2019-05-05 DIAGNOSIS — K580 Irritable bowel syndrome with diarrhea: Secondary | ICD-10-CM | POA: Diagnosis not present

## 2019-05-05 DIAGNOSIS — I10 Essential (primary) hypertension: Secondary | ICD-10-CM | POA: Diagnosis not present

## 2019-05-05 DIAGNOSIS — J1282 Pneumonia due to coronavirus disease 2019: Secondary | ICD-10-CM | POA: Diagnosis not present

## 2019-05-05 DIAGNOSIS — J9601 Acute respiratory failure with hypoxia: Secondary | ICD-10-CM | POA: Diagnosis not present

## 2019-05-07 DIAGNOSIS — Z23 Encounter for immunization: Secondary | ICD-10-CM | POA: Diagnosis not present

## 2019-05-17 DIAGNOSIS — E782 Mixed hyperlipidemia: Secondary | ICD-10-CM | POA: Diagnosis not present

## 2019-05-17 DIAGNOSIS — K219 Gastro-esophageal reflux disease without esophagitis: Secondary | ICD-10-CM | POA: Diagnosis not present

## 2019-05-17 DIAGNOSIS — I10 Essential (primary) hypertension: Secondary | ICD-10-CM | POA: Diagnosis not present

## 2019-05-20 DIAGNOSIS — I739 Peripheral vascular disease, unspecified: Secondary | ICD-10-CM | POA: Diagnosis not present

## 2019-05-20 DIAGNOSIS — Z681 Body mass index (BMI) 19 or less, adult: Secondary | ICD-10-CM | POA: Diagnosis not present

## 2019-05-20 DIAGNOSIS — I10 Essential (primary) hypertension: Secondary | ICD-10-CM | POA: Diagnosis not present

## 2019-05-20 DIAGNOSIS — R911 Solitary pulmonary nodule: Secondary | ICD-10-CM | POA: Diagnosis not present

## 2019-05-20 DIAGNOSIS — E039 Hypothyroidism, unspecified: Secondary | ICD-10-CM | POA: Diagnosis not present

## 2019-05-20 DIAGNOSIS — E782 Mixed hyperlipidemia: Secondary | ICD-10-CM | POA: Diagnosis not present

## 2019-05-20 DIAGNOSIS — I2510 Atherosclerotic heart disease of native coronary artery without angina pectoris: Secondary | ICD-10-CM | POA: Diagnosis not present

## 2019-05-20 DIAGNOSIS — L900 Lichen sclerosus et atrophicus: Secondary | ICD-10-CM | POA: Diagnosis not present

## 2019-05-21 ENCOUNTER — Other Ambulatory Visit: Payer: Self-pay | Admitting: *Deleted

## 2019-05-21 ENCOUNTER — Other Ambulatory Visit: Payer: Self-pay

## 2019-05-21 ENCOUNTER — Encounter: Payer: Self-pay | Admitting: *Deleted

## 2019-05-21 NOTE — Patient Outreach (Signed)
[facility]) Care Management  05/21/2019  AIREANA RYLAND 1927-05-26 [id number]   [facility] outreach for complex care   Mrs Verne was initially referred to [facility] on 03/11/19 by Pollyann Samples [facility] acute care coordinator after her discharge on 03/10/19 from [facility] for covid-19, Coronary Artery Disease (CAD),Hyperlipidemia (HLD), GERD (gastroesophageal reflux disease). [facility] Transition of care services are completed today and complex care will continue  On APL-Insurance:NextGen Medicare, [place] to Mrs Fidalgo was successful to her home number  Patient is able to verify HIPAA ([place]) identifiers, date of birth (DOB) and address Reviewed with patient there reason for the outreach -to complete the monthly follow up outreach as agreed to review for care coordination needs and home management of her medical issues since last outreach  She voiced understanding  Consent: [facility] RN CM reviewed [facility] services with patient. Patient gave verbal consent for services  Hypertension (HTN) Mrs Tietje continues to monitor at home and denies any elevations or changes since last outreach except that Dr Quillian Quince on 05/20/19 request she stop lasix daily,  to monitor for edema and to call the MD office if worsening symptoms. [facility] RN CM also discussed with her the interventions to elevate her legs and to monitor for sodium intake. She and [facility] RN CM reviewed canned foods as high sodium content and discussed reading food labels. She confirms she does not add salt to food as she was doing before Mr Hirsch passed  She will add these to her action plan weight (wt) reported ranging from 100- 103 lbs  She denies changes in nutrition/appetite.   covid  She denies covid signs and symptoms today  She confirms she did receive her first covid vaccine and is scheduled to receive the second one at [facility] drug on Monday 05/24/19   Falls denied since last outreach but discussed various bruises as she reports her skin is thin She is not on blood thinners She is noted only on Aspirin 81 mg qd. She ws encouraged to discuss the bruises and use of her aspirin with MD She states she prefers to continue aspirin 81 mg daily  Glasses and dentures  She has both and is having them adjusted by providers Eye dr in [place], no teeth has temporary and permanent dentures Temporary dentures fit better per pt  Local Transportation  She discusses having to await assistance from her sons to provide transportation when she previously was independent related to driving. 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 She has an associate degree college education Her son, [name] (eldest son) and her grand daughter assists with food, house keeping and transportation to medical appointments She has support of her two sons, daughter in law and 2 grand daughters she "raised"She reports her sons both live about 5 miles away from her. She does not drive anymore since her fall andsubdural hematoma (SDH)in 2019. She reports having a car she does not drive in her car port  She [place] for exercise She reports a history of 7 siblings (6 sisters and  1 deceased brother) She report the date Mr Barkalow passed was on July 25th   Conditions:Hypertension (HTN), hypothyroidism,Hyperlipidemia (HLD), chronic subdural hematoma, chronic diarrhea, diverticulosis of colon without hemorrhage,GERD (gastroesophageal reflux disease), syncope/near syncope, Coronary Artery Disease (CAD) (s/p RCA stent in 2001),  history  (hx), peripheral vascular disease (PVD), diarrhea, pressure injury of skin, history of positive covid test Pt reports a Heart attack/myocardial infarction (MI) in 2001, She reports 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 and 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 (she reports she does "better when reading people's lips"), former smoker quit 01/17/1996  Falls No fall since snf 03/10/19 discharge  "too many" in the last 3 months, had [place] before her subdural hematoma (SDH)in 2019  DMErolling walker, glasses, pulse oximeter,hearing aid but it "does not work right" (she has a letter related to it but is awaiting coordination with her sons to get her to the facility)  Appointments Dr Quillian Quince q 4 months next around September 19 2019   Plans Mrs Lora confirms she prefers [facility] complex care follow up every 3 months  [facility] RN CM will follow up with Mrs Person within the next 180-200 days as agreed unless other issues occur Pt encouraged to return a call to [facility] RN CM prn Routed note to MD  [facility] CM Care Plan Problem One     Most Recent Value  Care Plan Problem One  Risk for hospital readmission secondary to recently diagnosed chronic subdural hematoma  Role Documenting the Problem One  Care Management Telephonic Coordinator  Care Plan for Problem One  Active  [facility] Long Term Goal   Over the next 31 days, patient will not experience hospital readmission, as evidence by patient reporting and review of EMR during [facility] RN CCM outreach  [facility] Long Term Goal Start Date  03/12/19  [facility] Long Term Goal Met Date  04/20/19  TH[facility] CM Short Term Goal #1   Over the next 7 days, patient will schedule hospital follow up office visit appointments as evidenced by patient reporting during [facility] RN CCM outreach  [facility] CM Short Term Goal #1 Start Date  03/12/19  [facility] CM Short Term Goal #1 Met Date  03/16/19    [facility] CM Care Plan Problem Two     Most Recent Value  Care Plan Problem Two  knowledge deficit of home care for chronic subdural hematoma, HTN, fall  prevention, covid  Role Documenting the Problem Two  Care Management Telephonic Coordinator  Care Plan for Problem Two  Active  Interventions for Problem Two Long Term Goal   assessed for home management and changes in medical conditions, discussed elevation of legs, discussed low sodium foods and food labels encouraged to notifiy MD of any worsening symptoms assessed for falls, HTN & nutritional assessment completed  [facility] Long Term Goal  over the next 90 days patient will be able to verbalize during follow up outreach 2-3 interventions of action plan for home care of HTN, chronic subdural hematoma adn fall prevention  [facility]N Long Term Goal Start Date  04/20/19  [facility] CM Short Term Goal #1   over the next 30 days patient will receive her covid vaccine  THN CM Short Term Goal #1 Start Date  04/20/19  [facility] CM Short Term Goal #1 Met Date   05/21/19  [facility]N CM Short Term Goal #2   over the next 180 days patient will receive list of local transportation services to assist with alternative transportation as verbalized during outreach  [facility] CM Short Term Goal #2  Start Date  05/21/19  Interventions for Short Term Goal #2  assessed for needs, discussed local transportation services, sent via mail information on local [place] aging transportation [employer]. Lavina Hamman, RN, BSN, [place] Coordinator Office number [phone] Mobile number [phone]  Main [facility]N number [phone] Fax number [phone]

## 2019-05-26 DIAGNOSIS — Z23 Encounter for immunization: Secondary | ICD-10-CM | POA: Diagnosis not present

## 2019-06-28 DIAGNOSIS — I10 Essential (primary) hypertension: Secondary | ICD-10-CM | POA: Diagnosis not present

## 2019-06-28 DIAGNOSIS — E7849 Other hyperlipidemia: Secondary | ICD-10-CM | POA: Diagnosis not present

## 2019-06-28 DIAGNOSIS — I25111 Atherosclerotic heart disease of native coronary artery with angina pectoris with documented spasm: Secondary | ICD-10-CM | POA: Diagnosis not present

## 2019-06-28 DIAGNOSIS — Z87891 Personal history of nicotine dependence: Secondary | ICD-10-CM | POA: Diagnosis not present

## 2019-08-27 ENCOUNTER — Ambulatory Visit: Payer: Self-pay | Admitting: *Deleted

## 2019-08-27 DIAGNOSIS — Z87891 Personal history of nicotine dependence: Secondary | ICD-10-CM | POA: Diagnosis not present

## 2019-08-27 DIAGNOSIS — I25111 Atherosclerotic heart disease of native coronary artery with angina pectoris with documented spasm: Secondary | ICD-10-CM | POA: Diagnosis not present

## 2019-08-27 DIAGNOSIS — E7849 Other hyperlipidemia: Secondary | ICD-10-CM | POA: Diagnosis not present

## 2019-08-27 DIAGNOSIS — I10 Essential (primary) hypertension: Secondary | ICD-10-CM | POA: Diagnosis not present

## 2019-09-08 DIAGNOSIS — H35371 Puckering of macula, right eye: Secondary | ICD-10-CM | POA: Diagnosis not present

## 2019-09-08 DIAGNOSIS — H52203 Unspecified astigmatism, bilateral: Secondary | ICD-10-CM | POA: Diagnosis not present

## 2019-09-08 DIAGNOSIS — Z961 Presence of intraocular lens: Secondary | ICD-10-CM | POA: Diagnosis not present

## 2019-09-09 DIAGNOSIS — R946 Abnormal results of thyroid function studies: Secondary | ICD-10-CM | POA: Diagnosis not present

## 2019-09-09 DIAGNOSIS — K219 Gastro-esophageal reflux disease without esophagitis: Secondary | ICD-10-CM | POA: Diagnosis not present

## 2019-09-09 DIAGNOSIS — E782 Mixed hyperlipidemia: Secondary | ICD-10-CM | POA: Diagnosis not present

## 2019-09-09 DIAGNOSIS — R5382 Chronic fatigue, unspecified: Secondary | ICD-10-CM | POA: Diagnosis not present

## 2019-09-09 DIAGNOSIS — I10 Essential (primary) hypertension: Secondary | ICD-10-CM | POA: Diagnosis not present

## 2019-09-13 DIAGNOSIS — D692 Other nonthrombocytopenic purpura: Secondary | ICD-10-CM | POA: Diagnosis not present

## 2019-09-13 DIAGNOSIS — R911 Solitary pulmonary nodule: Secondary | ICD-10-CM | POA: Diagnosis not present

## 2019-09-13 DIAGNOSIS — Z681 Body mass index (BMI) 19 or less, adult: Secondary | ICD-10-CM | POA: Diagnosis not present

## 2019-09-13 DIAGNOSIS — I10 Essential (primary) hypertension: Secondary | ICD-10-CM | POA: Diagnosis not present

## 2019-09-13 DIAGNOSIS — L400 Psoriasis vulgaris: Secondary | ICD-10-CM | POA: Diagnosis not present

## 2019-09-13 DIAGNOSIS — I2510 Atherosclerotic heart disease of native coronary artery without angina pectoris: Secondary | ICD-10-CM | POA: Diagnosis not present

## 2019-09-13 DIAGNOSIS — L900 Lichen sclerosus et atrophicus: Secondary | ICD-10-CM | POA: Diagnosis not present

## 2019-09-13 DIAGNOSIS — E782 Mixed hyperlipidemia: Secondary | ICD-10-CM | POA: Diagnosis not present

## 2019-10-01 ENCOUNTER — Other Ambulatory Visit: Payer: Self-pay | Admitting: *Deleted

## 2019-10-05 ENCOUNTER — Other Ambulatory Visit: Payer: Self-pay | Admitting: *Deleted

## 2019-10-26 ENCOUNTER — Other Ambulatory Visit: Payer: Self-pay | Admitting: *Deleted

## 2019-10-26 NOTE — Patient Outreach (Addendum)
[facility]) Care Management  10/26/2019  Melissa Bentley December 22, 1927 [id number]  Unsuccessful outreach attempt made to patient. RN Health Coach left HIPAA compliant voicemail message along with her contact information.  Plan: RN Health Coach will call patient within the month of November.  [name] RN, BSN [facility] Care Management  RN Health Coach [phone] [email or web address]

## 2019-10-28 DIAGNOSIS — Z87891 Personal history of nicotine dependence: Secondary | ICD-10-CM | POA: Diagnosis not present

## 2019-10-28 DIAGNOSIS — E7849 Other hyperlipidemia: Secondary | ICD-10-CM | POA: Diagnosis not present

## 2019-10-28 DIAGNOSIS — I25111 Atherosclerotic heart disease of native coronary artery with angina pectoris with documented spasm: Secondary | ICD-10-CM | POA: Diagnosis not present

## 2019-10-28 DIAGNOSIS — I10 Essential (primary) hypertension: Secondary | ICD-10-CM | POA: Diagnosis not present

## 2019-11-27 DIAGNOSIS — Z87891 Personal history of nicotine dependence: Secondary | ICD-10-CM | POA: Diagnosis not present

## 2019-11-27 DIAGNOSIS — E7849 Other hyperlipidemia: Secondary | ICD-10-CM | POA: Diagnosis not present

## 2019-11-27 DIAGNOSIS — I25111 Atherosclerotic heart disease of native coronary artery with angina pectoris with documented spasm: Secondary | ICD-10-CM | POA: Diagnosis not present

## 2019-11-27 DIAGNOSIS — I10 Essential (primary) hypertension: Secondary | ICD-10-CM | POA: Diagnosis not present

## 2019-11-29 ENCOUNTER — Other Ambulatory Visit: Payer: Self-pay | Admitting: *Deleted

## 2019-11-29 NOTE — Patient Outreach (Signed)
[facility]) Care Management  11/29/2019  Melissa Bentley February 25, 1927 [id number]  Spoke to patient who confirmed her name but states she gets too many phone call scams daily to be able to trust this phone call. Nurse stated that she absolutely understood that patient's skepticism and asked if she could send the patient a [facility] pamphlet and her business cared in the mail to confirm this nurse's identity. Patient responded that the information could be sent.   Plan: Nurse will send patient a [facility] pamphlet and her business card and will call patient back within the month.   [name] RN, Mining engineer [facility] [phone] [name]

## 2019-12-08 DIAGNOSIS — Z23 Encounter for immunization: Secondary | ICD-10-CM | POA: Diagnosis not present

## 2019-12-28 DIAGNOSIS — Z87891 Personal history of nicotine dependence: Secondary | ICD-10-CM | POA: Diagnosis not present

## 2019-12-28 DIAGNOSIS — E7849 Other hyperlipidemia: Secondary | ICD-10-CM | POA: Diagnosis not present

## 2019-12-28 DIAGNOSIS — I10 Essential (primary) hypertension: Secondary | ICD-10-CM | POA: Diagnosis not present

## 2019-12-28 DIAGNOSIS — I25111 Atherosclerotic heart disease of native coronary artery with angina pectoris with documented spasm: Secondary | ICD-10-CM | POA: Diagnosis not present

## 2019-12-29 ENCOUNTER — Other Ambulatory Visit: Payer: Self-pay | Admitting: *Deleted

## 2019-12-29 NOTE — Patient Outreach (Signed)
[facility]) Care Management  12/29/2019  MASHANDA ISHIBASHI Jun 21, 1927 [id number]  Unsuccessful outreach attempt made to patient. RN Health Coach left HIPAA compliant voicemail message along with her contact information.  Plan: RN Health Coach will call patient within the month of January.  [name] RN, BSN [facility] Care Management  RN Health Coach [phone] [email or web address]

## 2020-01-11 DIAGNOSIS — Z23 Encounter for immunization: Secondary | ICD-10-CM | POA: Diagnosis not present

## 2020-01-12 DIAGNOSIS — I739 Peripheral vascular disease, unspecified: Secondary | ICD-10-CM | POA: Diagnosis not present

## 2020-01-12 DIAGNOSIS — E782 Mixed hyperlipidemia: Secondary | ICD-10-CM | POA: Diagnosis not present

## 2020-01-12 DIAGNOSIS — I25119 Atherosclerotic heart disease of native coronary artery with unspecified angina pectoris: Secondary | ICD-10-CM | POA: Diagnosis not present

## 2020-01-12 DIAGNOSIS — Z6820 Body mass index (BMI) 20.0-20.9, adult: Secondary | ICD-10-CM | POA: Diagnosis not present

## 2020-01-12 DIAGNOSIS — I10 Essential (primary) hypertension: Secondary | ICD-10-CM | POA: Diagnosis not present

## 2020-01-12 DIAGNOSIS — K2100 Gastro-esophageal reflux disease with esophagitis, without bleeding: Secondary | ICD-10-CM | POA: Diagnosis not present

## 2020-01-12 DIAGNOSIS — L400 Psoriasis vulgaris: Secondary | ICD-10-CM | POA: Diagnosis not present

## 2020-01-12 DIAGNOSIS — F331 Major depressive disorder, recurrent, moderate: Secondary | ICD-10-CM | POA: Diagnosis not present

## 2020-01-12 DIAGNOSIS — Z0001 Encounter for general adult medical examination with abnormal findings: Secondary | ICD-10-CM | POA: Diagnosis not present

## 2020-01-12 DIAGNOSIS — L900 Lichen sclerosus et atrophicus: Secondary | ICD-10-CM | POA: Diagnosis not present

## 2020-01-12 DIAGNOSIS — D692 Other nonthrombocytopenic purpura: Secondary | ICD-10-CM | POA: Diagnosis not present

## 2020-01-12 DIAGNOSIS — Z9189 Other specified personal risk factors, not elsewhere classified: Secondary | ICD-10-CM | POA: Diagnosis not present

## 2020-01-12 DIAGNOSIS — E039 Hypothyroidism, unspecified: Secondary | ICD-10-CM | POA: Diagnosis not present

## 2020-01-12 DIAGNOSIS — R911 Solitary pulmonary nodule: Secondary | ICD-10-CM | POA: Diagnosis not present

## 2020-01-12 DIAGNOSIS — M545 Low back pain, unspecified: Secondary | ICD-10-CM | POA: Diagnosis not present

## 2020-01-28 DIAGNOSIS — I10 Essential (primary) hypertension: Secondary | ICD-10-CM | POA: Diagnosis not present

## 2020-01-28 DIAGNOSIS — E7849 Other hyperlipidemia: Secondary | ICD-10-CM | POA: Diagnosis not present

## 2020-01-28 DIAGNOSIS — Z87891 Personal history of nicotine dependence: Secondary | ICD-10-CM | POA: Diagnosis not present

## 2020-01-28 DIAGNOSIS — I25111 Atherosclerotic heart disease of native coronary artery with angina pectoris with documented spasm: Secondary | ICD-10-CM | POA: Diagnosis not present

## 2020-02-11 ENCOUNTER — Other Ambulatory Visit: Payer: Self-pay | Admitting: *Deleted

## 2020-02-11 NOTE — Patient Outreach (Signed)
[facility]) Care Management  02/11/2020  Melissa Bentley 02-01-1927 [id number]  Unsuccessful outreach attempt made to patient. RN Health Coach left HIPAA compliant voicemail message along with her contact information.  Plan: RN Health Coach will call patient within the month of February and will send an unsuccessful letter.   [name] RN, BSN [facility] Care Management  RN Health Coach [phone] [email or web address]

## 2020-03-15 ENCOUNTER — Other Ambulatory Visit: Payer: Self-pay | Admitting: *Deleted

## 2020-03-15 NOTE — Patient Outreach (Addendum)
[facility]) Care Management  03/15/2020  GALENA LOGIE 05/26/1927 [id number]  Unsuccessful outreach attempt made to patient. RN Health Coach left HIPAA compliant voicemail message along with her contact information.  Plan: RN Health Coach will call patient within the month of March.  [name] RN, BSN [facility] Care Management  RN Health Coach [phone] [email or web address]

## 2020-04-25 DIAGNOSIS — M545 Low back pain, unspecified: Secondary | ICD-10-CM | POA: Diagnosis not present

## 2020-04-25 DIAGNOSIS — F331 Major depressive disorder, recurrent, moderate: Secondary | ICD-10-CM | POA: Diagnosis not present

## 2020-04-25 DIAGNOSIS — Z6820 Body mass index (BMI) 20.0-20.9, adult: Secondary | ICD-10-CM | POA: Diagnosis not present

## 2020-04-27 ENCOUNTER — Other Ambulatory Visit: Payer: Self-pay | Admitting: *Deleted

## 2020-04-27 NOTE — Patient Outreach (Signed)
[facility]) Care Management  04/27/2020  [name] 1927-05-11 [id number]  Unsuccessful outreach attempt made to patient. RN Health Coach left HIPAA compliant voicemail message along with her contact information.  Plan: RN Health Coach will call patient within the month of May and will send an unsuccessful letter.  [name] RN, BSN [facility] Care Management  RN Health Coach [phone] [email or web address]

## 2020-05-03 DIAGNOSIS — E782 Mixed hyperlipidemia: Secondary | ICD-10-CM | POA: Diagnosis not present

## 2020-05-03 DIAGNOSIS — E7849 Other hyperlipidemia: Secondary | ICD-10-CM | POA: Diagnosis not present

## 2020-05-03 DIAGNOSIS — I10 Essential (primary) hypertension: Secondary | ICD-10-CM | POA: Diagnosis not present

## 2020-05-03 DIAGNOSIS — K2100 Gastro-esophageal reflux disease with esophagitis, without bleeding: Secondary | ICD-10-CM | POA: Diagnosis not present

## 2020-05-15 DIAGNOSIS — I10 Essential (primary) hypertension: Secondary | ICD-10-CM | POA: Diagnosis not present

## 2020-05-15 DIAGNOSIS — R911 Solitary pulmonary nodule: Secondary | ICD-10-CM | POA: Diagnosis not present

## 2020-05-15 DIAGNOSIS — I25111 Atherosclerotic heart disease of native coronary artery with angina pectoris with documented spasm: Secondary | ICD-10-CM | POA: Diagnosis not present

## 2020-05-15 DIAGNOSIS — L900 Lichen sclerosus et atrophicus: Secondary | ICD-10-CM | POA: Diagnosis not present

## 2020-05-15 DIAGNOSIS — E7849 Other hyperlipidemia: Secondary | ICD-10-CM | POA: Diagnosis not present

## 2020-05-15 DIAGNOSIS — E039 Hypothyroidism, unspecified: Secondary | ICD-10-CM | POA: Diagnosis not present

## 2020-05-15 DIAGNOSIS — L400 Psoriasis vulgaris: Secondary | ICD-10-CM | POA: Diagnosis not present

## 2020-05-15 DIAGNOSIS — Z23 Encounter for immunization: Secondary | ICD-10-CM | POA: Diagnosis not present

## 2020-05-29 ENCOUNTER — Other Ambulatory Visit: Payer: Self-pay | Admitting: *Deleted

## 2020-05-29 NOTE — Patient Outreach (Addendum)
[facility]) Care Management  05/29/2020  PANDORA MCCRACKIN Mar 22, 1927 [id number]  Multiple attempts to establish contact with patient without success. No response from unsuccessful  letters mailed to patient. Case is being closed at this time.   Plan: RN Health Coach will close case.  [name] RN, BSN [facility] Care Management  RN Health Coach [phone] [email or web address]

## 2020-07-27 DIAGNOSIS — I10 Essential (primary) hypertension: Secondary | ICD-10-CM | POA: Diagnosis not present

## 2020-07-27 DIAGNOSIS — I25111 Atherosclerotic heart disease of native coronary artery with angina pectoris with documented spasm: Secondary | ICD-10-CM | POA: Diagnosis not present

## 2020-07-27 DIAGNOSIS — Z87891 Personal history of nicotine dependence: Secondary | ICD-10-CM | POA: Diagnosis not present

## 2020-07-27 DIAGNOSIS — E7849 Other hyperlipidemia: Secondary | ICD-10-CM | POA: Diagnosis not present

## 2020-08-27 DIAGNOSIS — Z87891 Personal history of nicotine dependence: Secondary | ICD-10-CM | POA: Diagnosis not present

## 2020-08-27 DIAGNOSIS — I10 Essential (primary) hypertension: Secondary | ICD-10-CM | POA: Diagnosis not present

## 2020-08-27 DIAGNOSIS — I25111 Atherosclerotic heart disease of native coronary artery with angina pectoris with documented spasm: Secondary | ICD-10-CM | POA: Diagnosis not present

## 2020-08-27 DIAGNOSIS — E7849 Other hyperlipidemia: Secondary | ICD-10-CM | POA: Diagnosis not present

## 2020-09-07 DIAGNOSIS — E7849 Other hyperlipidemia: Secondary | ICD-10-CM | POA: Diagnosis not present

## 2020-09-07 DIAGNOSIS — E039 Hypothyroidism, unspecified: Secondary | ICD-10-CM | POA: Diagnosis not present

## 2020-09-07 DIAGNOSIS — K2100 Gastro-esophageal reflux disease with esophagitis, without bleeding: Secondary | ICD-10-CM | POA: Diagnosis not present

## 2020-09-07 DIAGNOSIS — E782 Mixed hyperlipidemia: Secondary | ICD-10-CM | POA: Diagnosis not present

## 2020-09-07 DIAGNOSIS — R5382 Chronic fatigue, unspecified: Secondary | ICD-10-CM | POA: Diagnosis not present

## 2020-09-11 DIAGNOSIS — Z1331 Encounter for screening for depression: Secondary | ICD-10-CM | POA: Diagnosis not present

## 2020-09-11 DIAGNOSIS — E039 Hypothyroidism, unspecified: Secondary | ICD-10-CM | POA: Diagnosis not present

## 2020-09-11 DIAGNOSIS — Z1389 Encounter for screening for other disorder: Secondary | ICD-10-CM | POA: Diagnosis not present

## 2020-09-11 DIAGNOSIS — I25111 Atherosclerotic heart disease of native coronary artery with angina pectoris with documented spasm: Secondary | ICD-10-CM | POA: Diagnosis not present

## 2020-09-11 DIAGNOSIS — R911 Solitary pulmonary nodule: Secondary | ICD-10-CM | POA: Diagnosis not present

## 2020-09-11 DIAGNOSIS — L900 Lichen sclerosus et atrophicus: Secondary | ICD-10-CM | POA: Diagnosis not present

## 2020-09-11 DIAGNOSIS — I10 Essential (primary) hypertension: Secondary | ICD-10-CM | POA: Diagnosis not present

## 2020-09-11 DIAGNOSIS — E7849 Other hyperlipidemia: Secondary | ICD-10-CM | POA: Diagnosis not present

## 2020-09-13 DIAGNOSIS — Z961 Presence of intraocular lens: Secondary | ICD-10-CM | POA: Diagnosis not present

## 2020-09-13 DIAGNOSIS — H35371 Puckering of macula, right eye: Secondary | ICD-10-CM | POA: Diagnosis not present

## 2020-09-13 DIAGNOSIS — H353131 Nonexudative age-related macular degeneration, bilateral, early dry stage: Secondary | ICD-10-CM | POA: Diagnosis not present

## 2020-09-13 DIAGNOSIS — H52223 Regular astigmatism, bilateral: Secondary | ICD-10-CM | POA: Diagnosis not present

## 2020-10-16 DIAGNOSIS — Z20828 Contact with and (suspected) exposure to other viral communicable diseases: Secondary | ICD-10-CM | POA: Diagnosis not present

## 2020-10-17 DIAGNOSIS — I10 Essential (primary) hypertension: Secondary | ICD-10-CM | POA: Diagnosis not present

## 2020-10-17 DIAGNOSIS — L900 Lichen sclerosus et atrophicus: Secondary | ICD-10-CM | POA: Diagnosis not present

## 2020-10-17 DIAGNOSIS — E7849 Other hyperlipidemia: Secondary | ICD-10-CM | POA: Diagnosis not present

## 2020-10-17 DIAGNOSIS — E039 Hypothyroidism, unspecified: Secondary | ICD-10-CM | POA: Diagnosis not present

## 2020-10-17 DIAGNOSIS — R911 Solitary pulmonary nodule: Secondary | ICD-10-CM | POA: Diagnosis not present

## 2020-10-17 DIAGNOSIS — I25110 Atherosclerotic heart disease of native coronary artery with unstable angina pectoris: Secondary | ICD-10-CM | POA: Diagnosis not present

## 2020-10-17 DIAGNOSIS — D692 Other nonthrombocytopenic purpura: Secondary | ICD-10-CM | POA: Diagnosis not present

## 2020-10-17 DIAGNOSIS — Z23 Encounter for immunization: Secondary | ICD-10-CM | POA: Diagnosis not present

## 2020-11-15 DIAGNOSIS — Z20828 Contact with and (suspected) exposure to other viral communicable diseases: Secondary | ICD-10-CM | POA: Diagnosis not present

## 2020-12-14 DIAGNOSIS — Z20828 Contact with and (suspected) exposure to other viral communicable diseases: Secondary | ICD-10-CM | POA: Diagnosis not present

## 2020-12-15 DIAGNOSIS — S300XXA Contusion of lower back and pelvis, initial encounter: Secondary | ICD-10-CM | POA: Diagnosis not present

## 2020-12-15 DIAGNOSIS — S39012A Strain of muscle, fascia and tendon of lower back, initial encounter: Secondary | ICD-10-CM | POA: Diagnosis not present

## 2020-12-15 DIAGNOSIS — I10 Essential (primary) hypertension: Secondary | ICD-10-CM | POA: Diagnosis not present

## 2020-12-15 DIAGNOSIS — K5730 Diverticulosis of large intestine without perforation or abscess without bleeding: Secondary | ICD-10-CM | POA: Diagnosis not present

## 2020-12-15 DIAGNOSIS — S32592A Other specified fracture of left pubis, initial encounter for closed fracture: Secondary | ICD-10-CM | POA: Diagnosis not present

## 2020-12-15 DIAGNOSIS — S32512A Fracture of superior rim of left pubis, initial encounter for closed fracture: Secondary | ICD-10-CM | POA: Diagnosis not present

## 2020-12-15 DIAGNOSIS — I252 Old myocardial infarction: Secondary | ICD-10-CM | POA: Diagnosis not present

## 2020-12-15 DIAGNOSIS — S32502A Unspecified fracture of left pubis, initial encounter for closed fracture: Secondary | ICD-10-CM | POA: Diagnosis not present

## 2020-12-15 DIAGNOSIS — M48061 Spinal stenosis, lumbar region without neurogenic claudication: Secondary | ICD-10-CM | POA: Diagnosis not present

## 2020-12-15 DIAGNOSIS — S0003XA Contusion of scalp, initial encounter: Secondary | ICD-10-CM | POA: Diagnosis not present

## 2020-12-15 DIAGNOSIS — I700 Atherosclerosis of aorta: Secondary | ICD-10-CM | POA: Diagnosis not present

## 2020-12-15 DIAGNOSIS — M4316 Spondylolisthesis, lumbar region: Secondary | ICD-10-CM | POA: Diagnosis not present

## 2020-12-15 DIAGNOSIS — R9082 White matter disease, unspecified: Secondary | ICD-10-CM | POA: Diagnosis not present

## 2020-12-15 DIAGNOSIS — Z9889 Other specified postprocedural states: Secondary | ICD-10-CM | POA: Diagnosis not present

## 2020-12-15 DIAGNOSIS — G319 Degenerative disease of nervous system, unspecified: Secondary | ICD-10-CM | POA: Diagnosis not present

## 2020-12-15 DIAGNOSIS — M47816 Spondylosis without myelopathy or radiculopathy, lumbar region: Secondary | ICD-10-CM | POA: Diagnosis not present

## 2020-12-15 DIAGNOSIS — Z043 Encounter for examination and observation following other accident: Secondary | ICD-10-CM | POA: Diagnosis not present

## 2020-12-15 DIAGNOSIS — G9389 Other specified disorders of brain: Secondary | ICD-10-CM | POA: Diagnosis not present

## 2020-12-19 DIAGNOSIS — Z681 Body mass index (BMI) 19 or less, adult: Secondary | ICD-10-CM | POA: Diagnosis not present

## 2020-12-19 DIAGNOSIS — I7300 Raynaud's syndrome without gangrene: Secondary | ICD-10-CM | POA: Diagnosis not present

## 2020-12-19 DIAGNOSIS — S32592A Other specified fracture of left pubis, initial encounter for closed fracture: Secondary | ICD-10-CM | POA: Diagnosis not present

## 2020-12-27 DIAGNOSIS — M810 Age-related osteoporosis without current pathological fracture: Secondary | ICD-10-CM | POA: Diagnosis not present

## 2021-01-15 DIAGNOSIS — Z20828 Contact with and (suspected) exposure to other viral communicable diseases: Secondary | ICD-10-CM | POA: Diagnosis not present

## 2021-01-19 DIAGNOSIS — I10 Essential (primary) hypertension: Secondary | ICD-10-CM | POA: Diagnosis not present

## 2021-01-19 DIAGNOSIS — E7849 Other hyperlipidemia: Secondary | ICD-10-CM | POA: Diagnosis not present

## 2021-01-19 DIAGNOSIS — J449 Chronic obstructive pulmonary disease, unspecified: Secondary | ICD-10-CM | POA: Diagnosis not present

## 2021-01-19 DIAGNOSIS — K2100 Gastro-esophageal reflux disease with esophagitis, without bleeding: Secondary | ICD-10-CM | POA: Diagnosis not present

## 2021-01-19 DIAGNOSIS — E782 Mixed hyperlipidemia: Secondary | ICD-10-CM | POA: Diagnosis not present

## 2021-01-19 DIAGNOSIS — E039 Hypothyroidism, unspecified: Secondary | ICD-10-CM | POA: Diagnosis not present

## 2021-01-24 DIAGNOSIS — Z0001 Encounter for general adult medical examination with abnormal findings: Secondary | ICD-10-CM | POA: Diagnosis not present

## 2021-01-24 DIAGNOSIS — I10 Essential (primary) hypertension: Secondary | ICD-10-CM | POA: Diagnosis not present

## 2021-01-24 DIAGNOSIS — J449 Chronic obstructive pulmonary disease, unspecified: Secondary | ICD-10-CM | POA: Diagnosis not present

## 2021-01-24 DIAGNOSIS — L900 Lichen sclerosus et atrophicus: Secondary | ICD-10-CM | POA: Diagnosis not present

## 2021-01-24 DIAGNOSIS — I25110 Atherosclerotic heart disease of native coronary artery with unstable angina pectoris: Secondary | ICD-10-CM | POA: Diagnosis not present

## 2021-01-24 DIAGNOSIS — E039 Hypothyroidism, unspecified: Secondary | ICD-10-CM | POA: Diagnosis not present

## 2021-01-24 DIAGNOSIS — R911 Solitary pulmonary nodule: Secondary | ICD-10-CM | POA: Diagnosis not present

## 2021-01-24 DIAGNOSIS — Z23 Encounter for immunization: Secondary | ICD-10-CM | POA: Diagnosis not present

## 2021-02-21 ENCOUNTER — Emergency Department (HOSPITAL_COMMUNITY)
Admission: EM | Admit: 2021-02-21 | Discharge: 2021-02-21 | Disposition: A | Discharge status: Home / Self Care | Payer: Medicare Other | Attending: Emergency Medicine | Admitting: Emergency Medicine

## 2021-02-21 ENCOUNTER — Other Ambulatory Visit: Payer: Self-pay

## 2021-02-21 ENCOUNTER — Emergency Department (HOSPITAL_COMMUNITY): Payer: Medicare Other

## 2021-02-21 ENCOUNTER — Encounter (HOSPITAL_COMMUNITY): Payer: Self-pay | Admitting: Emergency Medicine

## 2021-02-21 DIAGNOSIS — S32512D Fracture of superior rim of left pubis, subsequent encounter for fracture with routine healing: Secondary | ICD-10-CM | POA: Diagnosis not present

## 2021-02-21 DIAGNOSIS — M25552 Pain in left hip: Secondary | ICD-10-CM | POA: Diagnosis not present

## 2021-02-21 DIAGNOSIS — Y92009 Unspecified place in unspecified non-institutional (private) residence as the place of occurrence of the external cause: Secondary | ICD-10-CM | POA: Diagnosis not present

## 2021-02-21 DIAGNOSIS — R001 Bradycardia, unspecified: Secondary | ICD-10-CM | POA: Diagnosis not present

## 2021-02-21 DIAGNOSIS — Z743 Need for continuous supervision: Secondary | ICD-10-CM | POA: Diagnosis not present

## 2021-02-21 DIAGNOSIS — W19XXXA Unspecified fall, initial encounter: Secondary | ICD-10-CM | POA: Insufficient documentation

## 2021-02-21 DIAGNOSIS — M79662 Pain in left lower leg: Secondary | ICD-10-CM | POA: Diagnosis not present

## 2021-02-21 DIAGNOSIS — M79605 Pain in left leg: Secondary | ICD-10-CM | POA: Diagnosis not present

## 2021-02-21 DIAGNOSIS — I499 Cardiac arrhythmia, unspecified: Secondary | ICD-10-CM | POA: Diagnosis not present

## 2021-02-21 DIAGNOSIS — R0902 Hypoxemia: Secondary | ICD-10-CM | POA: Diagnosis not present

## 2021-02-21 DIAGNOSIS — K5730 Diverticulosis of large intestine without perforation or abscess without bleeding: Secondary | ICD-10-CM | POA: Diagnosis not present

## 2021-02-21 DIAGNOSIS — S32511D Fracture of superior rim of right pubis, subsequent encounter for fracture with routine healing: Secondary | ICD-10-CM | POA: Diagnosis not present

## 2021-02-21 MED ORDER — ONDANSETRON HCL 4 MG/2ML IJ SOLN
4.0000 mg | Freq: Once | INTRAMUSCULAR | Status: AC
  Administered 2021-02-21: 18:00:00 4 mg via INTRAVENOUS
  Filled 2021-02-21: qty 2

## 2021-02-21 MED ORDER — SODIUM CHLORIDE 0.9 % IV BOLUS
500.0000 mL | Freq: Once | INTRAVENOUS | Status: AC
  Administered 2021-02-21: 18:00:00 500 mL via INTRAVENOUS

## 2021-02-21 MED ORDER — FENTANYL CITRATE PF 50 MCG/ML IJ SOSY
12.5000 ug | PREFILLED_SYRINGE | Freq: Once | INTRAMUSCULAR | Status: AC
  Administered 2021-02-21: 18:00:00 12.5 ug via INTRAVENOUS
  Filled 2021-02-21: qty 1

## 2021-02-21 NOTE — ED Triage Notes (Signed)
Pt lives at home alone. Called son and told him she fell. He called ambulance. Pt arrived a/o , nad, no obvious deformities. Pt c/o left lower leg pain. Pt has multiple bruising noted but no abrasions.

## 2021-02-21 NOTE — ED Notes (Signed)
Walked in to pt with rag over head and emesis bag. Advised edp pt is nauseated and requesting meds for pain

## 2021-02-21 NOTE — ED Notes (Signed)
Pt returned to room. Nad. No ss of pain noted. Son at bedside and updated.

## 2021-02-21 NOTE — Discharge Instructions (Signed)
As discussed, today's evaluation has been somewhat reassuring.  However, with your recent increase in falls it is important that you consider rehabilitation placement or least physical therapy at home.  A referral has been placed on your behalf.  You will be contacted tomorrow or in the coming days for assistance with rehabilitation placement and physical therapy as well as home health.  In addition this, please call your physician's office tomorrow to let them know about today's evaluation, and our ongoing efforts.  Return here for concerning changes in your condition.

## 2021-02-21 NOTE — ED Notes (Signed)
Pt taken to xray 

## 2021-02-21 NOTE — ED Provider Notes (Signed)
[facility] Provider Note   CSN: [id number] Arrival date & time: 02/21/21  1601     History  Chief Complaint  Patient presents with   Marijean HeathFall    Zerina R Morrill is a 88 y.o. female.  HPI Patient arrives via EMS transport after a fall.  History is obtained by her and staff.  She notes that she fell while at home, where she lives alone.  She fell onto her left side, since that time has had pain throughout the left lower extremity, none on the right, none of the thorax, no head, neck pain. Pain is sore.  EMS reports no hemodynamic instability.  Patient has a history of prior fall with pelvic fracture end of last year.  Patient expressed concern that this may have been reinjured. She denies distal loss of sensation or weakness.    Home Medications Prior to Admission medications   Medication Sig Start Date End Date Taking? Authorizing Provider  aspirin 81 MG tablet Take 81 mg by mouth daily. Reported on 04/27/2015   Yes [provider]  atorvastatin (LIPITOR) 40 MG tablet Take 40 mg by mouth daily at 6 PM.  01/05/15  Yes [provider]  cilostazol (PLETAL) 100 MG tablet Take 100 mg by mouth 2 (two) times daily.  01/05/15  Yes [provider]  DULoxetine (CYMBALTA) 30 MG capsule Take 30 mg by mouth daily. 02/13/21  Yes [provider]  furosemide (LASIX) 20 MG tablet Take 20 mg by mouth every morning. 02/13/21  Yes [provider]  levothyroxine (SYNTHROID) 50 MCG tablet Take 50 mcg by mouth daily. 07/01/18  Yes [provider]  metoprolol tartrate (LOPRESSOR) 25 MG tablet 25 mg 2 (two) times daily.  09/30/18  Yes [provider]  pantoprazole (PROTONIX) 40 MG tablet Take 40 mg by mouth daily.   Yes [provider]  potassium chloride (KLOR-CON) 10 MEQ tablet Take 20 mEq by mouth daily. 02/13/21  Yes [provider]  traZODone (DESYREL) 50 MG tablet Take 100 mg by mouth at bedtime. 07/01/18  Yes  [provider]  acetaminophen-codeine (TYLENOL #3) 300-30 MG tablet Take 1 tablet by mouth 3 (three) times daily as needed. Patient not taking: Reported on 02/21/2021 12/16/20   [provider]  citalopram (CELEXA) 20 MG tablet Take 20 mg by mouth daily.  Patient not taking: Reported on 02/21/2021 01/05/15   [provider]      Allergies    Patient has no known allergies.    Review of Systems   Review of Systems  Constitutional:        Per HPI, otherwise negative  HENT:         Per HPI, otherwise negative  Respiratory:         Per HPI, otherwise negative  Cardiovascular:        Per HPI, otherwise negative  Gastrointestinal:  Negative for vomiting.  Endocrine:       Negative aside from HPI  Genitourinary:        Neg aside from HPI   Musculoskeletal:        Per HPI, otherwise negative  Skin: Negative.   Neurological:  Negative for syncope.   Physical Exam Updated Vital Signs BP 106/76    Pulse 64    Temp (!) 97.3 F (36.3 C) (Axillary)    Resp 17    SpO2 100%  Physical Exam Vitals and nursing note reviewed.  Constitutional:  General: She is not in acute distress.    Appearance: She is well-developed.     Comments: Cachectic elderly female awake and alert  HENT:     Head: Normocephalic and atraumatic.  Eyes:     Conjunctiva/sclera: Conjunctivae normal.  Cardiovascular:     Rate and Rhythm: Normal rate and regular rhythm.  Pulmonary:     Effort: Pulmonary effort is normal. No respiratory distress.     Breath sounds: Normal breath sounds. No stridor.  Abdominal:     General: There is no distension.  Musculoskeletal:     Comments: Bones easily palpated due to diffuse muscle atrophy.  No tenderness in the pelvis, patient flexes each hip and each knee spontaneously and to command, without apparent limited range of motion.  There is some tenderness throughout the lateral hip, thigh, knee, but no crepitus.  Skin:    General: Skin is warm and  dry.  Neurological:     Mental Status: She is alert and oriented to person, place, and time.     Cranial Nerves: No cranial nerve deficit.    ED Results / Procedures / Treatments   Labs (all labs ordered are listed, but only abnormal results are displayed) Labs Reviewed - No data to display  EKG None  Radiology DG Ankle Complete Left  Result Date: 02/21/2021 CLINICAL DATA:  Left leg pain.  Bruising.  Fall? EXAM: LEFT ANKLE COMPLETE - 3+ VIEW COMPARISON:  None. FINDINGS: There is no evidence of fracture, dislocation, or joint effusion. The bones are under mineralized. Ankle mortise is preserved. Intact talar dome. There is no evidence of arthropathy or other focal bone abnormality. Soft tissues are unremarkable. IMPRESSION: No fracture or subluxation of the left ankle. Electronically Signed   By: Narda Rutherford M.D.   On: 02/21/2021 16:51   CT PELVIS WO CONTRAST  Result Date: 02/21/2021 CLINICAL DATA:  LEFT hip pain after fall at home today, abnormal radiographs demonstrating pelvic fracture EXAM: CT PELVIS WITHOUT CONTRAST TECHNIQUE: Multidetector CT imaging of the pelvis was performed following the standard protocol without intravenous contrast. RADIATION DOSE REDUCTION: This exam was performed according to the departmental dose-optimization program which includes automated exposure control, adjustment of the mA and/or kV according to patient size and/or use of iterative reconstruction technique. COMPARISON:  LEFT hip radiographs 02/21/2021, CT pelvis 12/15/2020 FINDINGS: Urinary Tract:  Distal ureters and bladder unremarkable. Bowel: Slightly increased stool in distal colon. Minimal distal colonic diverticulosis. Pelvic bowel loops otherwise unremarkable. Vascular/Lymphatic: Dense atherosclerotic calcifications aorta, iliac arteries, femoral arteries. No adenopathy. Reproductive:  Uterus surgically absent.  Unremarkable ovaries. Other:  No free air or free fluid.  No hernia. Musculoskeletal:  Marked osseous demineralization. Degenerative facet and disc disease changes at lower lumbar spine. Sacrum intact. Hip and SI joint spaces preserved. Subacute healing fractures of the RIGHT superior and inferior pubic rami identified, demonstrating sclerosis and callus. Femora intact. No femoral fracture or dislocation. IMPRESSION: Subacute healing fractures of the RIGHT superior and inferior pubic rami. The LEFT inferior pubic ramus fracture was present on the prior CT of 12/15/2020. The LEFT superior pubic ramus fracture was not seen on the previous study but is subacute. Minimal distal colonic diverticulosis. Aortic Atherosclerosis (ICD10-I70.0). Electronically Signed   By: Ulyses Southward M.D.   On: 02/21/2021 18:15   DG Knee Complete 4 Views Left  Result Date: 02/21/2021 CLINICAL DATA:  Left leg pain.  Fall? EXAM: LEFT KNEE - COMPLETE 4+ VIEW COMPARISON:  None. FINDINGS: The bones are diffusely under  mineralized. No evidence of fracture, dislocation, or joint effusion. No evidence of arthropathy or other focal bone abnormality. Vascular calcifications. Soft tissues are unremarkable. IMPRESSION: No acute fracture or dislocation of the left knee. Electronically Signed   By: Narda Rutherford M.D.   On: 02/21/2021 16:48   DG Hip Unilat With Pelvis 2-3 Views Left  Result Date: 02/21/2021 CLINICAL DATA:  Left leg pain.  Bruising.  Fall? EXAM: DG HIP (WITH OR WITHOUT PELVIS) 2-3V LEFT COMPARISON:  Pelvis CT 12/15/2020 FINDINGS: Nondisplaced left acetabular fracture is the puboacetabular junction. Nondisplaced left inferior pubic ramus fracture with callus formation, also seen on prior CT. Femoral head remains seated. Normal hip joint space. The bones are diffusely under mineralized. No evidence of avascular necrosis or erosion. Vascular calcifications. Soft tissues are atrophic. IMPRESSION: 1. Nondisplaced left acetabular fracture at the puboacetabular junction, possibly acute, not definitively seen on prior  pelvic CT. 2. Nondisplaced left inferior pubic ramus fracture with callus formation is subacute. Electronically Signed   By: Narda Rutherford M.D.   On: 02/21/2021 16:47    Procedures Procedures    Medications Ordered in ED Medications  fentaNYL (SUBLIMAZE) injection 12.5 mcg (12.5 mcg Intravenous Given 02/21/21 1822)  ondansetron (ZOFRAN) injection 4 mg (4 mg Intravenous Given 02/21/21 1822)  sodium chloride 0.9 % bolus 500 mL (0 mLs Intravenous Stopped 02/21/21 1849)    ED Course/ Medical Decision Making/ A&P Elderly female presenting after a fall in the context of new increased frequency of fall.  Patient is awake and alert, neurologically intact aside from diffuse atrophy and substantial hearing difficulty.  With concern for fall, x-rays ordered.  Update: Patient now committed by her son.  We discussed patient's recent history of increased falls, and after I interpreted her initial x-ray, concerning for possible new posterior pelvis fracture CT scan has been ordered.  7:35 PM I have interpreted CT results, evidence for subacute, but no acute fracture.  Patient remains awake and alert, hemodynamically unremarkable.  She has received analgesia, feels better.  She is hemodynamically unremarkable.  Together the 3 of [place] discussed her living situation she is amenable to referral for rehabilitation placement, and the son is agreeable to this as well.  Here, no evidence for neurovascular compromise, no new fracture, no hemodynamic stability after hours of monitoring, patient discharged in stable condition.                         Medical Decision Making Problems Addressed: Fall, initial encounter: acute illness or injury  Amount and/or Complexity of Data Reviewed Independent Historian: caregiver External Data Reviewed: notes.    Details: Patient unable to be contacted for outpatient assistance with rehabilitation Radiology: ordered and independent interpretation performed. Decision-making  details documented in ED Course.  Risk Prescription drug management. Decision regarding hospitalization. Risk Details: Patient's age, living independently status of both factors impacting ongoing efforts for care.  Critical Care Total time providing critical care: < 30 minutes Final Clinical Impression(s) / ED Diagnoses Final diagnoses:  Fall, initial encounter    Rx / DC Orders ED Discharge Orders          Ordered    Home Health        02/21/21 1933    Face-to-face encounter (required for Medicare/Medicaid patients)       Comments: I Gerhard Munch certify that this patient is under my care and that I, or a nurse practitioner or physician's assistant working with me, had a face-to-face  encounter that meets the physician face-to-face encounter requirements with this patient on 02/21/2021. The encounter with the patient was in whole, or in part for the following medical condition(s) which is the primary reason for home health care (List medical condition): additional orders per PMD   02/21/21 1933              Gerhard MunchLockwood, Pyper Olexa, MD 02/21/21 [id number]

## 2021-02-22 NOTE — Care Management (Signed)
Called PCP office to alert them to recommendation fort setting [facility] SNF. Spoke with Dr.Daniels nurse Kyra Manges will make Dr Reuel Boom aware

## 2021-03-01 ENCOUNTER — Emergency Department (HOSPITAL_COMMUNITY): Payer: Medicare Other

## 2021-03-01 ENCOUNTER — Other Ambulatory Visit: Payer: Self-pay

## 2021-03-01 ENCOUNTER — Inpatient Hospital Stay (HOSPITAL_COMMUNITY): Payer: Medicare Other

## 2021-03-01 ENCOUNTER — Encounter (HOSPITAL_COMMUNITY): Payer: Self-pay

## 2021-03-01 ENCOUNTER — Inpatient Hospital Stay (HOSPITAL_COMMUNITY)
Admission: EM | Admit: 2021-03-01 | Discharge: 2021-03-05 | DRG: 682 | Disposition: A | Discharge status: Skilled Nursing Facility | Payer: Medicare Other | Attending: Student | Admitting: Student

## 2021-03-01 DIAGNOSIS — Z66 Do not resuscitate: Secondary | ICD-10-CM | POA: Diagnosis present

## 2021-03-01 DIAGNOSIS — I6529 Occlusion and stenosis of unspecified carotid artery: Secondary | ICD-10-CM | POA: Diagnosis not present

## 2021-03-01 DIAGNOSIS — E162 Hypoglycemia, unspecified: Secondary | ICD-10-CM | POA: Diagnosis not present

## 2021-03-01 DIAGNOSIS — E872 Acidosis, unspecified: Secondary | ICD-10-CM | POA: Diagnosis not present

## 2021-03-01 DIAGNOSIS — E871 Hypo-osmolality and hyponatremia: Secondary | ICD-10-CM

## 2021-03-01 DIAGNOSIS — Z7982 Long term (current) use of aspirin: Secondary | ICD-10-CM

## 2021-03-01 DIAGNOSIS — E878 Other disorders of electrolyte and fluid balance, not elsewhere classified: Secondary | ICD-10-CM | POA: Diagnosis not present

## 2021-03-01 DIAGNOSIS — R262 Difficulty in walking, not elsewhere classified: Secondary | ICD-10-CM | POA: Diagnosis not present

## 2021-03-01 DIAGNOSIS — I2510 Atherosclerotic heart disease of native coronary artery without angina pectoris: Secondary | ICD-10-CM | POA: Diagnosis not present

## 2021-03-01 DIAGNOSIS — K224 Dyskinesia of esophagus: Secondary | ICD-10-CM | POA: Diagnosis present

## 2021-03-01 DIAGNOSIS — E538 Deficiency of other specified B group vitamins: Secondary | ICD-10-CM | POA: Diagnosis present

## 2021-03-01 DIAGNOSIS — R1310 Dysphagia, unspecified: Secondary | ICD-10-CM | POA: Diagnosis not present

## 2021-03-01 DIAGNOSIS — Z955 Presence of coronary angioplasty implant and graft: Secondary | ICD-10-CM

## 2021-03-01 DIAGNOSIS — R52 Pain, unspecified: Secondary | ICD-10-CM

## 2021-03-01 DIAGNOSIS — I672 Cerebral atherosclerosis: Secondary | ICD-10-CM | POA: Diagnosis not present

## 2021-03-01 DIAGNOSIS — R5381 Other malaise: Secondary | ICD-10-CM | POA: Diagnosis not present

## 2021-03-01 DIAGNOSIS — E43 Unspecified severe protein-calorie malnutrition: Secondary | ICD-10-CM | POA: Diagnosis not present

## 2021-03-01 DIAGNOSIS — R29818 Other symptoms and signs involving the nervous system: Secondary | ICD-10-CM | POA: Diagnosis not present

## 2021-03-01 DIAGNOSIS — N289 Disorder of kidney and ureter, unspecified: Secondary | ICD-10-CM

## 2021-03-01 DIAGNOSIS — M47812 Spondylosis without myelopathy or radiculopathy, cervical region: Secondary | ICD-10-CM | POA: Diagnosis not present

## 2021-03-01 DIAGNOSIS — K31819 Angiodysplasia of stomach and duodenum without bleeding: Secondary | ICD-10-CM | POA: Diagnosis present

## 2021-03-01 DIAGNOSIS — I10 Essential (primary) hypertension: Secondary | ICD-10-CM | POA: Diagnosis present

## 2021-03-01 DIAGNOSIS — E860 Dehydration: Secondary | ICD-10-CM | POA: Diagnosis not present

## 2021-03-01 DIAGNOSIS — Z9181 History of falling: Secondary | ICD-10-CM | POA: Diagnosis not present

## 2021-03-01 DIAGNOSIS — M810 Age-related osteoporosis without current pathological fracture: Secondary | ICD-10-CM | POA: Diagnosis not present

## 2021-03-01 DIAGNOSIS — R634 Abnormal weight loss: Secondary | ICD-10-CM | POA: Diagnosis not present

## 2021-03-01 DIAGNOSIS — R531 Weakness: Secondary | ICD-10-CM

## 2021-03-01 DIAGNOSIS — R1314 Dysphagia, pharyngoesophageal phase: Secondary | ICD-10-CM | POA: Diagnosis present

## 2021-03-01 DIAGNOSIS — I739 Peripheral vascular disease, unspecified: Secondary | ICD-10-CM | POA: Diagnosis not present

## 2021-03-01 DIAGNOSIS — K2289 Other specified disease of esophagus: Secondary | ICD-10-CM | POA: Diagnosis not present

## 2021-03-01 DIAGNOSIS — Z79890 Hormone replacement therapy (postmenopausal): Secondary | ICD-10-CM

## 2021-03-01 DIAGNOSIS — Z87891 Personal history of nicotine dependence: Secondary | ICD-10-CM

## 2021-03-01 DIAGNOSIS — J439 Emphysema, unspecified: Secondary | ICD-10-CM | POA: Diagnosis not present

## 2021-03-01 DIAGNOSIS — Z7189 Other specified counseling: Secondary | ICD-10-CM | POA: Diagnosis not present

## 2021-03-01 DIAGNOSIS — R001 Bradycardia, unspecified: Secondary | ICD-10-CM | POA: Diagnosis present

## 2021-03-01 DIAGNOSIS — E8729 Other acidosis: Secondary | ICD-10-CM | POA: Diagnosis not present

## 2021-03-01 DIAGNOSIS — E861 Hypovolemia: Secondary | ICD-10-CM | POA: Diagnosis not present

## 2021-03-01 DIAGNOSIS — Z9889 Other specified postprocedural states: Secondary | ICD-10-CM | POA: Diagnosis not present

## 2021-03-01 DIAGNOSIS — D61818 Other pancytopenia: Secondary | ICD-10-CM | POA: Diagnosis present

## 2021-03-01 DIAGNOSIS — I959 Hypotension, unspecified: Secondary | ICD-10-CM

## 2021-03-01 DIAGNOSIS — K449 Diaphragmatic hernia without obstruction or gangrene: Secondary | ICD-10-CM | POA: Diagnosis present

## 2021-03-01 DIAGNOSIS — D509 Iron deficiency anemia, unspecified: Secondary | ICD-10-CM | POA: Diagnosis not present

## 2021-03-01 DIAGNOSIS — E876 Hypokalemia: Secondary | ICD-10-CM | POA: Diagnosis not present

## 2021-03-01 DIAGNOSIS — Z515 Encounter for palliative care: Secondary | ICD-10-CM | POA: Diagnosis not present

## 2021-03-01 DIAGNOSIS — Z741 Need for assistance with personal care: Secondary | ICD-10-CM | POA: Diagnosis not present

## 2021-03-01 DIAGNOSIS — D649 Anemia, unspecified: Secondary | ICD-10-CM | POA: Diagnosis present

## 2021-03-01 DIAGNOSIS — I252 Old myocardial infarction: Secondary | ICD-10-CM | POA: Diagnosis not present

## 2021-03-01 DIAGNOSIS — R2681 Unsteadiness on feet: Secondary | ICD-10-CM | POA: Diagnosis not present

## 2021-03-01 DIAGNOSIS — J358 Other chronic diseases of tonsils and adenoids: Secondary | ICD-10-CM | POA: Diagnosis not present

## 2021-03-01 DIAGNOSIS — R627 Adult failure to thrive: Secondary | ICD-10-CM | POA: Diagnosis not present

## 2021-03-01 DIAGNOSIS — M25562 Pain in left knee: Secondary | ICD-10-CM | POA: Diagnosis not present

## 2021-03-01 DIAGNOSIS — R54 Age-related physical debility: Secondary | ICD-10-CM | POA: Diagnosis present

## 2021-03-01 DIAGNOSIS — E039 Hypothyroidism, unspecified: Secondary | ICD-10-CM | POA: Diagnosis present

## 2021-03-01 DIAGNOSIS — N179 Acute kidney failure, unspecified: Secondary | ICD-10-CM | POA: Diagnosis not present

## 2021-03-01 DIAGNOSIS — Z87820 Personal history of traumatic brain injury: Secondary | ICD-10-CM

## 2021-03-01 DIAGNOSIS — M6281 Muscle weakness (generalized): Secondary | ICD-10-CM | POA: Diagnosis not present

## 2021-03-01 DIAGNOSIS — R739 Hyperglycemia, unspecified: Secondary | ICD-10-CM | POA: Diagnosis not present

## 2021-03-01 DIAGNOSIS — E7800 Pure hypercholesterolemia, unspecified: Secondary | ICD-10-CM | POA: Diagnosis present

## 2021-03-01 DIAGNOSIS — K219 Gastro-esophageal reflux disease without esophagitis: Secondary | ICD-10-CM | POA: Diagnosis present

## 2021-03-01 DIAGNOSIS — R7401 Elevation of levels of liver transaminase levels: Secondary | ICD-10-CM | POA: Diagnosis not present

## 2021-03-01 DIAGNOSIS — J039 Acute tonsillitis, unspecified: Secondary | ICD-10-CM | POA: Diagnosis not present

## 2021-03-01 DIAGNOSIS — R197 Diarrhea, unspecified: Secondary | ICD-10-CM | POA: Diagnosis not present

## 2021-03-01 DIAGNOSIS — R110 Nausea: Secondary | ICD-10-CM | POA: Diagnosis not present

## 2021-03-01 DIAGNOSIS — Z79899 Other long term (current) drug therapy: Secondary | ICD-10-CM

## 2021-03-01 DIAGNOSIS — Z801 Family history of malignant neoplasm of trachea, bronchus and lung: Secondary | ICD-10-CM

## 2021-03-01 DIAGNOSIS — E785 Hyperlipidemia, unspecified: Secondary | ICD-10-CM | POA: Diagnosis not present

## 2021-03-01 DIAGNOSIS — Z681 Body mass index (BMI) 19 or less, adult: Secondary | ICD-10-CM | POA: Diagnosis not present

## 2021-03-01 DIAGNOSIS — E11649 Type 2 diabetes mellitus with hypoglycemia without coma: Secondary | ICD-10-CM | POA: Diagnosis not present

## 2021-03-01 DIAGNOSIS — S065XAS Traumatic subdural hemorrhage with loss of consciousness status unknown, sequela: Secondary | ICD-10-CM | POA: Diagnosis not present

## 2021-03-01 DIAGNOSIS — I6523 Occlusion and stenosis of bilateral carotid arteries: Secondary | ICD-10-CM | POA: Diagnosis not present

## 2021-03-01 DIAGNOSIS — R933 Abnormal findings on diagnostic imaging of other parts of digestive tract: Secondary | ICD-10-CM | POA: Diagnosis not present

## 2021-03-01 LAB — COMPREHENSIVE METABOLIC PANEL
ALT: 13 U/L (ref 0–44)
AST: 43 U/L — ABNORMAL HIGH (ref 15–41)
Albumin: 4.3 g/dL (ref 3.5–5.0)
Alkaline Phosphatase: 125 U/L (ref 38–126)
Anion gap: 16 — ABNORMAL HIGH (ref 5–15)
BUN: 21 mg/dL (ref 8–23)
CO2: 17 mmol/L — ABNORMAL LOW (ref 22–32)
Calcium: 9.3 mg/dL (ref 8.9–10.3)
Chloride: 95 mmol/L — ABNORMAL LOW (ref 98–111)
Creatinine, Ser: 1.56 mg/dL — ABNORMAL HIGH (ref 0.44–1.00)
GFR, Estimated: 31 mL/min — ABNORMAL LOW (ref >60)
Glucose, Bld: 93 mg/dL (ref 70–99)
Potassium: 4.1 mmol/L (ref 3.5–5.1)
Sodium: 128 mmol/L — ABNORMAL LOW (ref 135–145)
Total Bilirubin: 0.9 mg/dL (ref 0.3–1.2)
Total Protein: 7.8 g/dL (ref 6.5–8.1)

## 2021-03-01 LAB — GLUCOSE, CAPILLARY
Glucose-Capillary: 111 mg/dL — ABNORMAL HIGH (ref 70–99)
Glucose-Capillary: 76 mg/dL (ref 70–99)

## 2021-03-01 LAB — TSH
TSH: 0.362 u[IU]/mL (ref 0.350–4.500)
TSH: 0.237 u[IU]/mL — ABNORMAL LOW (ref 0.350–4.500)

## 2021-03-01 LAB — URINALYSIS, ROUTINE W REFLEX MICROSCOPIC
Bilirubin Urine: NEGATIVE
Glucose, UA: NEGATIVE mg/dL
Hgb urine dipstick: NEGATIVE
Ketones, ur: NEGATIVE mg/dL
Leukocytes,Ua: NEGATIVE
Nitrite: NEGATIVE
Protein, ur: NEGATIVE mg/dL
Specific Gravity, Urine: 1.015 (ref 1.005–1.030)
pH: 6 (ref 5.0–8.0)

## 2021-03-01 LAB — CBC WITH DIFFERENTIAL/PLATELET
Abs Immature Granulocytes: 0.02 10*3/uL (ref 0.00–0.07)
Basophils Absolute: 0 10*3/uL (ref 0.0–0.1)
Basophils Relative: 1 %
Eosinophils Absolute: 0 10*3/uL (ref 0.0–0.5)
Eosinophils Relative: 0 %
HCT: 38.5 % (ref 36.0–46.0)
Hemoglobin: 11.4 g/dL — ABNORMAL LOW (ref 12.0–15.0)
Immature Granulocytes: 0 %
Lymphocytes Relative: 23 %
Lymphs Abs: 1.2 10*3/uL (ref 0.7–4.0)
MCH: 22.9 pg — ABNORMAL LOW (ref 26.0–34.0)
MCHC: 29.6 g/dL — ABNORMAL LOW (ref 30.0–36.0)
MCV: 77.3 fL — ABNORMAL LOW (ref 80.0–100.0)
Monocytes Absolute: 0.4 10*3/uL (ref 0.1–1.0)
Monocytes Relative: 8 %
Neutro Abs: 3.6 10*3/uL (ref 1.7–7.7)
Neutrophils Relative %: 68 %
RBC: 4.98 MIL/uL (ref 3.87–5.11)
RDW: 19.9 % — ABNORMAL HIGH (ref 11.5–15.5)
WBC: 5.3 10*3/uL (ref 4.0–10.5)
nRBC: 0 % (ref 0.0–0.2)

## 2021-03-01 LAB — CBG MONITORING, ED
Glucose-Capillary: 50 mg/dL — ABNORMAL LOW (ref 70–99)
Glucose-Capillary: 72 mg/dL (ref 70–99)
Glucose-Capillary: 55 mg/dL — ABNORMAL LOW (ref 70–99)

## 2021-03-01 LAB — MAGNESIUM: Magnesium: 2.3 mg/dL (ref 1.7–2.4)

## 2021-03-01 MED ORDER — ATORVASTATIN CALCIUM 40 MG PO TABS
40.0000 mg | ORAL_TABLET | Freq: Every day | ORAL | Status: DC
  Administered 2021-03-02 – 2021-03-04 (×3): 40 mg via ORAL
  Filled 2021-03-01 (×3): qty 1

## 2021-03-01 MED ORDER — DEXTROSE 10 % IV SOLN: INTRAVENOUS | Status: DC

## 2021-03-01 MED ORDER — DEXTROSE 50 % IV SOLN: 1.0000 | Freq: Once | INTRAVENOUS | Status: DC

## 2021-03-01 MED ORDER — DEXTROSE 10 % IV SOLN: Freq: Once | INTRAVENOUS | Status: AC

## 2021-03-01 MED ORDER — ENSURE ENLIVE PO LIQD
237.0000 mL | Freq: Two times a day (BID) | ORAL | Status: DC
  Administered 2021-03-02 – 2021-03-05 (×5): 237 mL via ORAL
  Filled 2021-03-01 (×2): qty 237

## 2021-03-01 MED ORDER — BOOST / RESOURCE BREEZE PO LIQD CUSTOM
1.0000 | Freq: Three times a day (TID) | ORAL | Status: DC
  Administered 2021-03-03 – 2021-03-05 (×5): 1 via ORAL

## 2021-03-01 MED ORDER — SODIUM CHLORIDE 0.9 % IV BOLUS
1000.0000 mL | Freq: Once | INTRAVENOUS | Status: AC
  Administered 2021-03-01: 1000 mL via INTRAVENOUS

## 2021-03-01 MED ORDER — ACETAMINOPHEN 160 MG/5ML PO SOLN: 325.0000 mg | ORAL | Status: DC | PRN

## 2021-03-01 MED ORDER — LEVOTHYROXINE SODIUM 50 MCG PO TABS
50.0000 ug | ORAL_TABLET | Freq: Every day | ORAL | Status: DC
  Administered 2021-03-02 – 2021-03-05 (×4): 50 ug via ORAL
  Filled 2021-03-01 (×4): qty 1

## 2021-03-01 MED ORDER — PANTOPRAZOLE SODIUM 40 MG PO TBEC
40.0000 mg | DELAYED_RELEASE_TABLET | Freq: Every day | ORAL | Status: DC
  Administered 2021-03-02 – 2021-03-05 (×4): 40 mg via ORAL
  Filled 2021-03-01 (×4): qty 1

## 2021-03-01 MED ORDER — DULOXETINE HCL 30 MG PO CPEP
30.0000 mg | ORAL_CAPSULE | Freq: Every day | ORAL | Status: DC
Start: 2021-03-02 — End: 2021-03-05
  Administered 2021-03-02 – 2021-03-05 (×4): 30 mg via ORAL
  Filled 2021-03-01 (×4): qty 1

## 2021-03-01 NOTE — ED Provider Notes (Signed)
[facility]   CSN: [id number] Arrival date & time: 03/01/21  1507     History  Chief Complaint  Patient presents with   Weakness    Melissa Bentley is a 88 y.o. female.   Weakness  This patient is a elderly 88 year old female, she has history of hypertension, hypothyroidism and hypercholesterolemia.  Unfortunately the patient developed COVID-19 approximately 2-1/2 years ago, about a month ago she developed it again and unfortunately within the last couple of weeks she had a fall, in fact I have reviewed the medical history and it was on January 25 that she had this fall during which time she had no obvious injuries that would require surgery or admission to the hospital.  She went home in the care of family and they have been attending to her 24 hours a day since that time in the patient's own home.  She still lives by herself.  She has had significant difficulty eating or drinking, she is weak and sleeping almost 24 hours a day, she will not swallow food, she is taking very little liquid and when they called the family doctor today they offered hospice, the family member wanted her to be medically evaluated in person prior to initiating those measures.  The patient herself states "I hurt all over" but is not more specific.  Home Medications Prior to Admission medications   Medication Sig Start Date End Date Taking? Authorizing Provider  aspirin 81 MG tablet Take 81 mg by mouth daily. Reported on 04/27/2015   Yes [provider]  atorvastatin (LIPITOR) 40 MG tablet Take 40 mg by mouth daily at 6 PM.  01/05/15  Yes [provider]  cilostazol (PLETAL) 100 MG tablet Take 100 mg by mouth 2 (two) times daily.  01/05/15  Yes [provider]  DULoxetine (CYMBALTA) 30 MG capsule Take 30 mg by mouth daily. 02/13/21  Yes [provider]  furosemide (LASIX) 20 MG tablet Take 20 mg by mouth every morning. 02/13/21  Yes [provider]  levothyroxine (SYNTHROID) 50 MCG tablet Take 50 mcg by mouth daily. 07/01/18  Yes [provider]  metoprolol tartrate (LOPRESSOR) 25 MG tablet 25 mg 2 (two) times daily.  09/30/18  Yes [provider]  pantoprazole (PROTONIX) 40 MG tablet Take 40 mg by mouth daily.   Yes [provider]  potassium chloride (KLOR-CON) 10 MEQ tablet Take 20 mEq by mouth daily. 02/13/21  Yes [provider]  traZODone (DESYREL) 50 MG tablet Take 100 mg by mouth at bedtime. 07/01/18  Yes [provider]      Allergies    Patient has no known allergies.    Review of Systems   Review of Systems  Neurological:  Positive for weakness.  All other systems reviewed and are negative.  Physical Exam Updated Vital Signs BP 118/64    Pulse (!) 53    Temp (!) 96.8 F (36 C) (Rectal)    Resp 16    Ht 1.524 m (5')    Wt 46.7 kg    SpO2 98%    BMI 20.11 kg/m  Physical Exam Vitals and nursing note reviewed.  Constitutional:      General: She is not in acute distress.    Appearance: She is well-developed.  HENT:     Head: Normocephalic and atraumatic.     Mouth/Throat:     Mouth: Mucous membranes are dry.     Pharynx: No oropharyngeal exudate.  Eyes:     General: No scleral icterus.       Right eye: No discharge.        Left eye: No discharge.     Conjunctiva/sclera: Conjunctivae normal.     Pupils: Pupils are equal, round, and reactive to light.  Neck:     Thyroid: No thyromegaly.     Vascular: No JVD.  Cardiovascular:     Rate and Rhythm: Regular rhythm. Bradycardia present.     Heart sounds: Normal heart sounds. No murmur heard.   No friction rub. No gallop.  Pulmonary:     Effort: Pulmonary effort is normal. No respiratory distress.     Breath sounds: Normal breath sounds. No wheezing or rales.  Abdominal:     General: Bowel sounds are normal. There is no distension.     Palpations: Abdomen is soft. There is no mass.     Tenderness: There is no  abdominal tenderness.  Musculoskeletal:        General: No tenderness. Normal range of motion.     Cervical back: Normal range of motion and neck supple.     Right lower leg: No edema.     Left lower leg: No edema.  Lymphadenopathy:     Cervical: No cervical adenopathy.  Skin:    General: Skin is warm and dry.     Findings: No erythema or rash.  Neurological:     Mental Status: She is alert.     Coordination: Coordination normal.     Comments: Able to follow commands, very very weak and needs assistance sitting up in the bed.  Cannot lift either leg off the bed, weak grips bilaterally, no pronator drift  Psychiatric:        Behavior: Behavior normal.    ED Results / Procedures / Treatments   Labs (all labs ordered are listed, but only abnormal results are displayed) Labs Reviewed  CBC WITH DIFFERENTIAL/PLATELET - Abnormal; Notable for the following components:      Result Value   Hemoglobin 11.4 (*)    MCV 77.3 (*)    MCH 22.9 (*)    MCHC 29.6 (*)    RDW 19.9 (*)    All other components within normal limits  COMPREHENSIVE METABOLIC PANEL - Abnormal; Notable for the following components:   Sodium 128 (*)    Chloride 95 (*)    CO2 17 (*)    Creatinine, Ser 1.56 (*)    AST 43 (*)    GFR, Estimated 31 (*)    Anion gap 16 (*)    All other components within normal limits  CBG MONITORING, ED - Abnormal; Notable for the following components:   Glucose-Capillary 50 (*)    All other components within normal limits  CBG MONITORING, ED - Abnormal; Notable for the following components:   Glucose-Capillary 55 (*)    All other components within normal limits  URINE CULTURE  URINALYSIS, ROUTINE W REFLEX MICROSCOPIC  MAGNESIUM  TSH  CBG MONITORING, ED    EKG EKG Interpretation  Date/Time:  Thursday March 01 2021 15:31:30 EST Ventricular Rate:  50 PR Interval:  168 QRS Duration: 123 QT Interval:  474 QTC Calculation: 433 R Axis:   97 Text Interpretation: Sinus rhythm  Nonspecific intraventricular conduction delay Abnormal T, consider ischemia, lateral leads Since last tracing rate slower Confirmed by Eber Hong ([id number]) on 03/01/2021 4:05:16 PM  Radiology DG Chest Port 1 View  Result Date: 03/01/2021 CLINICAL DATA:  weakness EXAM: PORTABLE  CHEST 1 VIEW COMPARISON:  None. FINDINGS: The heart and mediastinal contours are normal. Aortic calcification. Hyperinflation of the lungs. Biapical pleural/pulmonary scarring. Surgical staples noted overlying the left apex. No focal consolidation. Chronically coarsened insert markings with no overt pulmonary edema. No pleural effusion. No pneumothorax. No acute osseous abnormality. IMPRESSION: 1. No active disease. 2. Aortic Atherosclerosis (ICD10-I70.0) and Emphysema (ICD10-J43.9). Electronically Signed   By: Tish FredericksonMorgane  Naveau M.D.   On: 03/01/2021 15:46    Procedures .Critical Care Performed by: Eber HongMiller, Demond Shallenberger, MD Authorized by: Eber HongMiller, Ozetta Flatley, MD   Critical care provider statement:    Critical care time (minutes):  30   Critical care time was exclusive of:  Separately billable procedures and treating other patients and teaching time   Critical care was necessary to treat or prevent imminent or life-threatening deterioration of the following conditions:  Renal failure and endocrine crisis   Critical care was time spent personally by me on the following activities:  Development of treatment plan with patient or surrogate, discussions with consultants, evaluation of patient's response to treatment, examination of patient, ordering and review of laboratory studies, ordering and review of radiographic studies, ordering and performing treatments and interventions, pulse oximetry, re-evaluation of patient's condition, review of old charts and obtaining history from patient or surrogate   I assumed direction of critical care for this patient from another provider in my specialty: no     Care discussed with: admitting provider    Comments:          Medications Ordered in ED Medications  dextrose 50 % solution 50 mL (50 mLs Intravenous Not Given 03/01/21 1858)  dextrose 10 % infusion (has no administration in time range)  sodium chloride 0.9 % bolus 1,000 mL (1,000 mLs Intravenous New Bag/Given 03/01/21 1656)    ED Course/ Medical Decision Making/ A&P                           Medical Decision Making Amount and/or Complexity of Data Reviewed Labs: ordered. Radiology: ordered. ECG/medicine tests: ordered.  Risk Prescription drug management. Decision regarding hospitalization.   This patient presents to the ED for concern of severe generalized weakness and dehydration, this involves an extensive number of treatment options, and is a complaint that carries with it a high risk of complications and morbidity.  The differential diagnosis includes electrolyte disturbance, metabolic abnormality, worsening infection, aspiration, pneumonia, hypothermia, she is hypoglycemic   Co morbidities that complicate the patient evaluation  Recent COVID-19, hypertension, hypercholesterolemia, hypothyroidism   Additional history obtained:  Additional history obtained from family member at the bedside as well as prior imaging that has been obtained through the hospital system over the last couple of weeks External records from outside source obtained and reviewed including no fractures on prior imaging   Lab Tests:  I Ordered, and personally interpreted labs.  The pertinent results include: Renal insufficiency on the metabolic panel, the patient also has hyperglycemia with a sugar that is around 50-55, this partially explains the patient's generalized weakness and is a good example of her failure to thrive.  Her hemoglobin is 11.4, she has no leukocytosis, she is hyponatremic at 128, creatinine is 1.56.  Magnesium is normal TSH is normal   Imaging Studies ordered:  I ordered imaging studies including portable chest  x-ray I independently visualized and interpreted imaging which showed no acute disease I agree with the radiologist interpretation   Cardiac Monitoring:  The patient was maintained  on a cardiac monitor.  I personally viewed and interpreted the cardiac monitored which showed an underlying rhythm of: Sinus bradycardia   Medicines ordered and prescription drug management:  I ordered medication including IV fluid for dehydration, also required D10 drip for hypoglycemia Reevaluation of the patient after these medicines showed that the patient improved I have reviewed the patients home medicines and have made adjustments as needed   Test Considered:  CT scan of the neck, to further evaluate the cause of the patient's dysphagia   Critical Interventions:  Dextrose drip due to hypoglycemia   Consultations Obtained:  I requested consultation with the hospitalist Dr. Randol Kern,  and discussed lab and imaging findings as well as pertinent plan - they recommend: Admission to the hospital.  Consultation completed at 7:00 PM   Problem List / ED Course:  Hypoglycemia is the primary metabolic abnormality with renal insufficiency.  This is likely related to poor intake and the patient did require D10.  She had peripheral IV access however discontinued to below and each vein would be disrupted and lose patency, I personally placed a 22-gauge in the right forearm with success on first attempt.  She is on a D10 drip now   Reevaluation:  After the interventions noted above, I reevaluated the patient and found that they have :improved   Social Determinants of Health:  Elderly, lives by herself, poor access to healthcare   Dispostion:  After consideration of the diagnostic results and the patients response to treatment, I feel that the patent would benefit from admission to the hospital.          Final Clinical Impression(s) / ED Diagnoses Final diagnoses:  Hypoglycemia  Acute  renal insufficiency  Dehydration  Weakness      Eber Hong, MD 03/01/21 [phone]

## 2021-03-01 NOTE — ED Triage Notes (Signed)
Pt presents to ED via POV with son for generalized weakness, unable to eat, per patient states she can't swallow. Pt son states he spoke with the PCP today and they talked about putting her on hospice care but they wanted a second opinion first and to have her checked out. The weakness and unable to eat has been since her last ED visit last Wednesday.

## 2021-03-01 NOTE — H&P (Signed)
TRH H&P   Patient Demographics:    Melissa Bentley, is a 88 y.o. female  MRN: [id number]   DOB - [birth year]  Admit Date - 03/01/2021  Outpatient Primary MD for the patient is Caryl Bis, MD  Referring MD/NP/PA: Dr Sabra Heck   Patient coming from: home   Chief Complaint  Patient presents with   Weakness      HPI:    Melissa Bentley  is a 88 y.o. female, with past medical history of hypertension, hypothyroidism, hyperlipidemia, GERD, history of fall and subdural hematoma s/p surgical intervention many years ago, patient usually lives home, with herself, she is with fall recently January 25, for which she had no injuries, family has been staying with her since. -Patient was brought by her family due to multiple complaints, mainly weight loss, failure to thrive, very poor oral intake and appetite, and increased lethargy where she is sleeping most of the day, as well she has been less interactive, as well she is having difficulty swallowing which is progressive over weeks, she cannot swallow any solids at this point, even pured has to be warted before she can swallow any, but she is still able to swallow some liquids, family called her family physician today, and he offered hospice, and family member wanted her to be medically evaluated in person prior to initiating those measures. - in ED work-up was significant for a creatinine of 1.5, sodium of 134, and she was hypoglycemic as well, EKG nonacute, here urine nonacute, no evidence of infection, given her hypoglycemia and dysphagia [facility] hospitalist consulted to admit.    Review of systems:    In addition to the HPI above,   A full 10 point Review of Systems was done, except as stated above, all other Review of Systems were negative.   With Past History of the following :    Past Medical History:  Diagnosis Date   CAD  (coronary artery disease)    Depression    GERD (gastroesophageal reflux disease)    Hyperlipidemia    Hypertension    Hypothyroidism    MI (myocardial infarction) ([place])    Osteoporosis    Psoriasis    PVD (peripheral vascular disease) ([place])       Past Surgical History:  Procedure Laterality Date   ABDOMINAL HYSTERECTOMY     CATARACT EXTRACTION Bilateral    COLONOSCOPY N/A 02/01/2015   Dr. Rourk:colonic diverticulosis s/p biopsy with unremarkable colonic mucosa, no microscopic colitis.    CORONARY ANGIOPLASTY WITH STENT PLACEMENT     CRANIOTOMY Right 10/03/2017   Procedure: CRANIOTOMY HEMATOMA EVACUATION SUBDURAL;  Surgeon: Ashok Pall, MD;  Location: [facility];  Service: Neurosurgery;  Laterality: Right;   CRANIOTOMY Right 10/05/2017   Procedure: REDO CRANIOTOMY HEMATOMA EVACUATION SUBDURAL;  Surgeon: Ashok Pall, MD;  Location: [place];  Service: Neurosurgery;  Laterality: Right;   EYE SURGERY  LUNG LOBECTOMY     SUBCLAVIAN ARTERY STENT     TUBAL LIGATION        Social History:     Social History   Tobacco Use   Smoking status: Former    Types: Cigarettes    Quit date: 01/17/1996    Years since quitting: 25.1   Smokeless tobacco: Never  Substance Use Topics   Alcohol use: No    Alcohol/week: 0.0 standard drinks         Family History  Problem Relation Age of Onset   Lung cancer Sister    Macular degeneration Sister    Colon cancer Neg Hx      Home Medications:   Prior to Admission medications   Medication Sig Start Date End Date Taking? Authorizing Provider  aspirin 81 MG tablet Take 81 mg by mouth daily. Reported on 04/27/2015   Yes [provider]  atorvastatin (LIPITOR) 40 MG tablet Take 40 mg by mouth daily at 6 PM.  01/05/15  Yes [provider]  cilostazol (PLETAL) 100 MG tablet Take 100 mg by mouth 2 (two) times daily.  01/05/15  Yes [provider]  DULoxetine (CYMBALTA) 30 MG capsule Take 30 mg by mouth daily. 02/13/21  Yes  [provider]  furosemide (LASIX) 20 MG tablet Take 20 mg by mouth every morning. 02/13/21  Yes [provider]  levothyroxine (SYNTHROID) 50 MCG tablet Take 50 mcg by mouth daily. 07/01/18  Yes [provider]  metoprolol tartrate (LOPRESSOR) 25 MG tablet 25 mg 2 (two) times daily.  09/30/18  Yes [provider]  pantoprazole (PROTONIX) 40 MG tablet Take 40 mg by mouth daily.   Yes [provider]  potassium chloride (KLOR-CON) 10 MEQ tablet Take 20 mEq by mouth daily. 02/13/21  Yes [provider]  traZODone (DESYREL) 50 MG tablet Take 100 mg by mouth at bedtime. 07/01/18  Yes [provider]     Allergies:    No Known Allergies   Physical Exam:   Vitals  Blood pressure 118/64, pulse (!) 53, temperature (!) 96.8 F (36 C), temperature source Rectal, resp. rate 16, height 5' (1.524 m), weight 46.7 kg, SpO2 98 %.   1. General frail elderly female, significantly cachectic, thin appearing  2.  Frail and elderly, able to answer questions, but she is extremely hard of hearing, as well she is difficult to understand due to lack of teeth.    3. No F.N deficits, ALL C.Nerves Intact, Strength 5/5 all 4 extremities, Sensation intact all 4 extremities, Plantars down going.  4. Ears and Eyes appear Normal, Conjunctivae clear, PERRLA. Dry  Oral Mucosa.  5. Supple Neck, No JVD, No cervical lymphadenopathy appriciated, No Carotid Bruits.  6. Symmetrical Chest wall movement, Good air movement bilaterally, CTAB.  7. RRR, No Gallops, Rubs or Murmurs, No Parasternal Heave.  8. Positive Bowel Sounds, Abdomen Soft, No tenderness, No organomegaly appriciated,No rebound -guarding or rigidity.  9.  No Cyanosis, delayed  Skin Turgor, No Skin Rash or Bruise.  10.  Current muscle wasting, diminished range of motion in left knee due to tenderness, significant arthritis in bilateral hands      Data Review:    CBC Recent Labs  Lab  03/01/21 1550  WBC 5.3  HGB 11.4*  HCT 38.5  PLT ACLMP  MCV 77.3*  MCH 22.9*  MCHC 29.6*  RDW 19.9*  LYMPHSABS 1.2  MONOABS 0.4  EOSABS 0.0  BASOSABS 0.0   ------------------------------------------------------------------------------------------------------------------  Chemistries  Recent Labs  Lab 03/01/21 1550  NA 128*  K 4.1  CL 95*  CO2 17*  GLUCOSE 93  BUN 21  CREATININE 1.56*  CALCIUM 9.3  MG 2.3  AST 43*  ALT 13  ALKPHOS 125  BILITOT 0.9   ------------------------------------------------------------------------------------------------------------------ estimated creatinine clearance is 16.9 mL/min (A) (by C-G formula based on SCr of 1.56 mg/dL (H)). ------------------------------------------------------------------------------------------------------------------ Recent Labs    03/01/21 1550  TSH 0.362    Coagulation profile No results for input(s): INR, PROTIME in the last 168 hours. ------------------------------------------------------------------------------------------------------------------- No results for input(s): DDIMER in the last 72 hours. -------------------------------------------------------------------------------------------------------------------  Cardiac Enzymes No results for input(s): CKMB, TROPONINI, MYOGLOBIN in the last 168 hours.  Invalid input(s): CK ------------------------------------------------------------------------------------------------------------------ No results found for: BNP   ---------------------------------------------------------------------------------------------------------------  Urinalysis    Component Value Date/Time   COLORURINE YELLOW 03/01/2021 [place] 03/01/2021 1757   LABSPEC 1.015 03/01/2021 1757   PHURINE 6.0 03/01/2021 1757   GLUCOSEU NEGATIVE 03/01/2021 1757   HGBUR NEGATIVE 03/01/2021 1757   BILIRUBINUR NEGATIVE 03/01/2021 1757   KETONESUR NEGATIVE 03/01/2021  1757   PROTEINUR NEGATIVE 03/01/2021 1757   NITRITE NEGATIVE 03/01/2021 1757   LEUKOCYTESUR NEGATIVE 03/01/2021 1757    ----------------------------------------------------------------------------------------------------------------   Imaging Results:    DG Chest Port 1 View  Result Date: 03/01/2021 CLINICAL DATA:  weakness EXAM: PORTABLE CHEST 1 VIEW COMPARISON:  None. FINDINGS: The heart and mediastinal contours are normal. Aortic calcification. Hyperinflation of the lungs. Biapical pleural/pulmonary scarring. Surgical staples noted overlying the left apex. No focal consolidation. Chronically coarsened insert markings with no overt pulmonary edema. No pleural effusion. No pneumothorax. No acute osseous abnormality. IMPRESSION: 1. No active disease. 2. Aortic Atherosclerosis (ICD10-I70.0) and Emphysema (ICD10-J43.9). Electronically Signed   By: Iven Finn M.D.   On: 03/01/2021 15:46    My personal review of EKG: Rhythm sinus bradycardia , Rate  50/min, QTc 433   Assessment & Plan:    Principal Problem:   Dysphagia Active Problems:   Failure to thrive in adult   Hypoglycemia   Protein-calorie malnutrition, severe (HCC)   Renal insufficiency  Failure to thrive -With significant weight loss, hypoglycemia, failure to thrive, with significant weight loss, possibly due to advanced age, but as well it looks dysphagia has been playing important role in it, please see discussion below. -We will consult nutritionist service. -She is with hyperglycemia, will keep on IV fluids for now. -We will keep on clear liquid diet, add some Ensure. -Overall she is with declining health over last few month, will place consult for palliative medicine - check TSH  Dysphagia -Aims to be progressive, initially to solids, now even to pured food, able to tolerate some liquids and taking her pills -We will check CT neck, esophageal gram, and will place consult for GI  Hypoglycemia -Due to malnutrition,  failure to thrive and poor oral intake, will start on D10 W with CBG every 4 hours.  Protein calorie malnutrition, severe -  due to above, will consult nutrition   Renal insufficiency -Creatinine is at 1.5, unclear baseline  Hyponatrmia -Due to volume depletion, should correct with IV fluids hyponatremia  GERD -Continue with with PPI  Hyperlipidemia -Continue statin once stable  X-ray due to left knee pain   DVT Prophylaxis - SCDs , will start lovenox when repeat labs show accurate platlet in am.  AM Labs Ordered, also please review Full Orders  Family Communication: Admission, patients condition and plan of care including tests being  ordered have been discussed with the patient and graddauther at bedside who indicate understanding and agree with the plan and Code Status.  Code Status DNR  Likely DC to  pending further work up, will need subacute rehab  Condition GUARDED    Consults called: palliaitve and GI consult placed in epic  Admission status: inpatient    Time spent in minutes : 24 minutes   Phillips Climes M.D on 03/01/2021 at 7:42 PM   [facility] - Office  [phone]

## 2021-03-01 NOTE — Progress Notes (Signed)
Juice given, pt tolerated several sips with no difficulty.

## 2021-03-02 ENCOUNTER — Inpatient Hospital Stay (HOSPITAL_COMMUNITY): Payer: Medicare Other

## 2021-03-02 DIAGNOSIS — E876 Hypokalemia: Secondary | ICD-10-CM

## 2021-03-02 DIAGNOSIS — D649 Anemia, unspecified: Secondary | ICD-10-CM

## 2021-03-02 DIAGNOSIS — R110 Nausea: Secondary | ICD-10-CM

## 2021-03-02 DIAGNOSIS — R7401 Elevation of levels of liver transaminase levels: Secondary | ICD-10-CM

## 2021-03-02 DIAGNOSIS — E8729 Other acidosis: Secondary | ICD-10-CM

## 2021-03-02 DIAGNOSIS — R197 Diarrhea, unspecified: Secondary | ICD-10-CM

## 2021-03-02 DIAGNOSIS — R1310 Dysphagia, unspecified: Secondary | ICD-10-CM

## 2021-03-02 DIAGNOSIS — N179 Acute kidney failure, unspecified: Principal | ICD-10-CM

## 2021-03-02 DIAGNOSIS — E871 Hypo-osmolality and hyponatremia: Secondary | ICD-10-CM

## 2021-03-02 LAB — RETICULOCYTES
Immature Retic Fract: 3.1 % (ref 2.3–15.9)
RBC.: 4.73 MIL/uL (ref 3.87–5.11)
Retic Count, Absolute: 25.1 10*3/uL (ref 19.0–186.0)
Retic Ct Pct: 0.5 % (ref 0.4–3.1)

## 2021-03-02 LAB — FERRITIN: Ferritin: 48 ng/mL (ref 11–307)

## 2021-03-02 LAB — BASIC METABOLIC PANEL
Anion gap: 8 (ref 5–15)
BUN: 17 mg/dL (ref 8–23)
CO2: 22 mmol/L (ref 22–32)
Calcium: 8.4 mg/dL — ABNORMAL LOW (ref 8.9–10.3)
Chloride: 101 mmol/L (ref 98–111)
Creatinine, Ser: 1.38 mg/dL — ABNORMAL HIGH (ref 0.44–1.00)
GFR, Estimated: 36 mL/min — ABNORMAL LOW (ref >60)
Glucose, Bld: 124 mg/dL — ABNORMAL HIGH (ref 70–99)
Potassium: 2.8 mmol/L — ABNORMAL LOW (ref 3.5–5.1)
Sodium: 131 mmol/L — ABNORMAL LOW (ref 135–145)

## 2021-03-02 LAB — IRON AND TIBC
Iron: 38 ug/dL (ref 28–170)
Saturation Ratios: 10 % — ABNORMAL LOW (ref 10.4–31.8)
TIBC: 365 ug/dL (ref 250–450)
UIBC: 327 ug/dL

## 2021-03-02 LAB — CBC
HCT: 33.2 % — ABNORMAL LOW (ref 36.0–46.0)
Hemoglobin: 9.9 g/dL — ABNORMAL LOW (ref 12.0–15.0)
MCH: 22.4 pg — ABNORMAL LOW (ref 26.0–34.0)
MCHC: 29.8 g/dL — ABNORMAL LOW (ref 30.0–36.0)
MCV: 75.3 fL — ABNORMAL LOW (ref 80.0–100.0)
Platelets: 133 10*3/uL — ABNORMAL LOW (ref 150–400)
RBC: 4.41 MIL/uL (ref 3.87–5.11)
RDW: 19.6 % — ABNORMAL HIGH (ref 11.5–15.5)
WBC: 4.1 10*3/uL (ref 4.0–10.5)
nRBC: 0 % (ref 0.0–0.2)

## 2021-03-02 LAB — T4, FREE: Free T4: 1.25 ng/dL — ABNORMAL HIGH (ref 0.61–1.12)

## 2021-03-02 LAB — GLUCOSE, CAPILLARY
Glucose-Capillary: 106 mg/dL — ABNORMAL HIGH (ref 70–99)
Glucose-Capillary: 124 mg/dL — ABNORMAL HIGH (ref 70–99)
Glucose-Capillary: 120 mg/dL — ABNORMAL HIGH (ref 70–99)
Glucose-Capillary: 122 mg/dL — ABNORMAL HIGH (ref 70–99)
Glucose-Capillary: 126 mg/dL — ABNORMAL HIGH (ref 70–99)
Glucose-Capillary: 140 mg/dL — ABNORMAL HIGH (ref 70–99)
Glucose-Capillary: 171 mg/dL — ABNORMAL HIGH (ref 70–99)

## 2021-03-02 LAB — MAGNESIUM: Magnesium: 1.9 mg/dL (ref 1.7–2.4)

## 2021-03-02 LAB — FOLATE: Folate: 5.8 ng/mL — ABNORMAL LOW (ref >5.9)

## 2021-03-02 LAB — VITAMIN B12: Vitamin B-12: 448 pg/mL (ref 180–914)

## 2021-03-02 LAB — CK: Total CK: 57 U/L (ref 38–234)

## 2021-03-02 MED ORDER — ADULT MULTIVITAMIN W/MINERALS CH
1.0000 | ORAL_TABLET | Freq: Every day | ORAL | Status: DC
  Administered 2021-03-02 – 2021-03-05 (×4): 1 via ORAL
  Filled 2021-03-02 (×4): qty 1

## 2021-03-02 MED ORDER — LOPERAMIDE HCL 2 MG/15ML PO SOLN
2.0000 mg | ORAL | Status: DC | PRN
  Administered 2021-03-02: 2 mg via ORAL
  Filled 2021-03-02 (×2): qty 15

## 2021-03-02 MED ORDER — ONDANSETRON HCL 4 MG/2ML IJ SOLN
4.0000 mg | Freq: Three times a day (TID) | INTRAMUSCULAR | Status: DC | PRN
  Administered 2021-03-02: 4 mg via INTRAVENOUS
  Filled 2021-03-02: qty 2

## 2021-03-02 MED ORDER — METOPROLOL TARTRATE 25 MG PO TABS
12.5000 mg | ORAL_TABLET | Freq: Two times a day (BID) | ORAL | Status: DC
  Administered 2021-03-02 – 2021-03-05 (×5): 12.5 mg via ORAL
  Filled 2021-03-02 (×7): qty 1

## 2021-03-02 MED ORDER — KCL IN DEXTROSE-NACL 20-5-0.9 MEQ/L-%-% IV SOLN: INTRAVENOUS | Status: DC

## 2021-03-02 MED ORDER — POTASSIUM CHLORIDE 20 MEQ PO PACK
40.0000 meq | PACK | ORAL | Status: AC
  Administered 2021-03-02 (×2): 40 meq via ORAL
  Filled 2021-03-02 (×2): qty 2

## 2021-03-02 NOTE — Evaluation (Signed)
Physical Therapy Evaluation Patient Details Name: Melissa Bentley MRN: [id number] DOB: 1927-12-17 Today's Date: 03/02/2021  History of Present Illness  Melissa Bentley  is a 88 y.o. female, with past medical history of hypertension, hypothyroidism, hyperlipidemia, GERD, history of fall and subdural hematoma s/p surgical intervention many years ago, patient usually lives home, with herself, she is with fall recently January 25, for which she had no injuries, family has been staying with her since.  -Patient was brought by her family due to multiple complaints, mainly weight loss, failure to thrive, very poor oral intake and appetite, and increased lethargy where she is sleeping most of the day, as well she has been less interactive, as well she is having difficulty swallowing which is progressive over weeks, she cannot swallow any solids at this point, even pured has to be warted before she can swallow any, but she is still able to swallow some liquids, family called her family physician today, and he offered hospice, and family member wanted her to be medically evaluated in person prior to initiating those measures.   Clinical Impression  Patient demonstrates slow labored movement for sitting up at bedside with c/o minor back pain, very unsteady on feet during sit to stands and tranfers, able to ambulate to doorway and back with slow labored cadence requiring Min/mod assist to avoid loss of balance and tolerated sitting up on BSC to have bowel movement after therapy - NT assisting.  Patient will benefit from continued skilled physical therapy in hospital and recommended venue below to increase strength, balance, endurance for safe ADLs and gait.          Recommendations for follow up therapy are one component of a multi-disciplinary discharge planning process, led by the attending physician.  Recommendations may be updated based on patient status, additional functional criteria and insurance  authorization.  Follow Up Recommendations Skilled nursing-short term rehab (<3 hours/day)    Assistance Recommended at Discharge Intermittent Supervision/Assistance  Patient can return home with the following  A lot of help with bathing/dressing/bathroom;A lot of help with walking and/or transfers;Assistance with cooking/housework;Help with stairs or ramp for entrance    Equipment Recommendations None recommended by PT  Recommendations for Other Services       Functional Status Assessment Patient has had a recent decline in their functional status and demonstrates the ability to make significant improvements in function in a reasonable and predictable amount of time.     Precautions / Restrictions Precautions Precautions: Fall Restrictions Weight Bearing Restrictions: No      Mobility  Bed Mobility Overal bed mobility: Needs Assistance Bed Mobility: Supine to Sit     Supine to sit: Min guard, Min assist     General bed mobility comments: slow labored movment with increased low back pain    Transfers Overall transfer level: Needs assistance Equipment used: Rolling walker (2 wheels) Transfers: Sit to/from Stand, Bed to chair/wheelchair/BSC Sit to Stand: Min guard, Min assist   Step pivot transfers: Min guard, Min assist       General transfer comment: slow labored movement with use of RW    Ambulation/Gait Ambulation/Gait assistance: Min assist, Mod assist Gait Distance (Feet): 22 Feet Assistive device: Rolling walker (2 wheels) Gait Pattern/deviations: Decreased step length - right, Decreased step length - left, Decreased stride length Gait velocity: decreased     General Gait Details: slow labored unsteady cadence requiring increased time to make turns, limited mosty due to c/o fatigue  Stairs  Wheelchair Mobility    Modified Rankin (Stroke Patients Only)       Balance Overall balance assessment: Needs assistance Sitting-balance  support: Feet supported, No upper extremity supported Sitting balance-Leahy Scale: Fair Sitting balance - Comments: fair/good seated at EOB   Standing balance support: During functional activity, Bilateral upper extremity supported Standing balance-Leahy Scale: Poor Standing balance comment: fair/poor using RW                             Pertinent Vitals/Pain Pain Assessment Pain Assessment: Faces Faces Pain Scale: Hurts little more Pain Location: low back during bed mobility Pain Descriptors / Indicators: Guarding, Sore Pain Intervention(s): Limited activity within patient's tolerance, Monitored during session, Repositioned    Home Living Family/patient expects to be discharged to:: Private residence Living Arrangements: Alone Available Help at Discharge: Family;Available 24 hours/day Type of Home: House Home Access: Stairs to enter   [employer] of Steps: 1   Home Layout: One level Home Equipment: Rollator (4 wheels);Wheelchair - manual;Grab bars - tub/shower      Prior Function Prior Level of Function : Needs assist  Cognitive Assist : Mobility (cognitive)     Physical Assist : Mobility (physical);ADLs (physical) Mobility (physical): Transfers;Gait;Stairs;Bed mobility ADLs (physical): Grooming;Bathing;Dressing;Toileting;IADLs Mobility Comments: Household and short distanced Writer ADLs Comments: Pt was mostly indpendent up until a week ago. Since then she has needed a lot of assistnace ofr ADL's and IADL's.     Hand Dominance   Dominant Hand: Right    Extremity/Trunk Assessment   Upper Extremity Assessment Upper Extremity Assessment: Defer to OT evaluation    Lower Extremity Assessment Lower Extremity Assessment: Generalized weakness    Cervical / Trunk Assessment Cervical / Trunk Assessment: Kyphotic  Communication   Communication: HOH  Cognition Arousal/Alertness: Awake/alert Behavior During Therapy: WFL for  tasks assessed/performed Overall Cognitive Status: Within Functional Limits for tasks assessed                                          General Comments      Exercises     Assessment/Plan    PT Assessment Patient needs continued PT services  PT Problem List Decreased strength;Decreased activity tolerance;Decreased balance;Decreased mobility       PT Treatment Interventions DME instruction;Gait training;Stair training;Functional mobility training;Therapeutic activities;Therapeutic exercise;Patient/family education;Balance training    PT Goals (Current goals can be found in the Care Plan section)  Acute Rehab PT Goals Patient Stated Goal: return home with family to assist PT Goal Formulation: With patient Time For Goal Achievement: 03/16/21 Potential to Achieve Goals: Good    Frequency Min 3X/week     Co-evaluation PT/OT/SLP Co-Evaluation/Treatment: Yes Reason for Co-Treatment: To address functional/ADL transfers PT goals addressed during session: Mobility/safety with mobility;Balance;Proper use of DME         AM-PAC PT "6 Clicks" Mobility  Outcome Measure Help needed turning from your back to your side while in a flat bed without using bedrails?: None Help needed moving from lying on your back to sitting on the side of a flat bed without using bedrails?: A Little Help needed moving to and from a bed to a chair (including a wheelchair)?: A Lot Help needed standing up from a chair using your arms (e.g., wheelchair or bedside chair)?: A Little Help needed to walk in hospital room?: A Lot  Help needed climbing 3-5 steps with a railing? : A Lot 6 Click Score: 16    End of Session   Activity Tolerance: Patient tolerated treatment well;Patient limited by fatigue Patient left: with call bell/phone within reach;with family/visitor present;Other (comment);with nursing/sitter in room (left sitting on [facility]) Nurse Communication: Mobility status PT Visit  Diagnosis: Unsteadiness on feet (R26.81);Other abnormalities of gait and mobility (R26.89);Muscle weakness (generalized) (M62.81)    Time: 8841-6606 PT Time Calculation (min) (ACUTE ONLY): 25 min   Charges:   PT Evaluation $PT Eval Moderate Complexity: 1 Mod PT Treatments $Therapeutic Activity: 23-37 mins        2:18 PM, 03/02/21 Ocie Bob, MPT Physical Therapist with [facility] 336 [phone] office [phone] mobile phone

## 2021-03-02 NOTE — Progress Notes (Signed)
Initial Nutrition Assessment  DOCUMENTATION CODES:   Severe malnutrition in context of acute illness/injury  INTERVENTION:  Ensure Enlive po BID, each supplement provides 350 kcal and 20 grams of protein.    NUTRITION DIAGNOSIS:   Severe Malnutrition related to acute illness (swallow problem) as evidenced by energy intake < or equal to 50% for > or equal to 5 days, moderate muscle depletion, moderate fat depletion, mild fat depletion, severe muscle depletion, BMI 16.83.   GOAL:  Patient will meet greater than or equal to 90% of their needs   MONITOR:  PO intake, Supplement acceptance, Diet advancement, Labs, Weight trends  REASON FOR ASSESSMENT:   Consult Assessment of nutrition requirement/status  ASSESSMENT: Patient is a 88 yo female from home with hx of CAD, GERD, MI, HTN and depression. Presents with weakness, report of swallow problem and poor oral intake. Failure to thrive in adult. Hard of hearing.  Patient receiving clear liquids today at lunch. She is feeding herself. Diet advanced for dinner full liquids. She has taken a few sips of broth and is full. ST with patient during RD visit. Son present who says endoscopy planned for tomorrow and pt will remain on liquids today.   History of weight loss. Currently underweight with BMI 16.83.   Labs: BMP Latest Ref Rng & Units 03/02/2021 03/01/2021 10/05/2017  Glucose 70 - 99 mg/dL 408(X) 93 98  BUN 8 - 23 mg/dL 17 21 -  Creatinine 4.48 - 1.00 mg/dL 1.85(U) 3.14(H) -  Sodium 135 - 145 mmol/L 131(L) 128(L) 140  Potassium 3.5 - 5.1 mmol/L 2.8(L) 4.1 4.0  Chloride 98 - 111 mmol/L 101 95(L) -  CO2 22 - 32 mmol/L 22 17(L) -  Calcium 8.9 - 10.3 mg/dL 7.0(Y) 9.3 -      NUTRITION - FOCUSED PHYSICAL EXAM:  Flowsheet Row Most Recent Value  Orbital Region Moderate depletion  Upper Arm Region Moderate depletion  Thoracic and Lumbar Region Moderate depletion  Buccal Region Mild depletion  Temple Region Mild depletion  Clavicle Bone  Region Moderate depletion  Clavicle and Acromion Bone Region Moderate depletion  Dorsal Hand Severe depletion  Patellar Region Severe depletion  Anterior Thigh Region Moderate depletion  Posterior Calf Region Moderate depletion  Edema (RD Assessment) None  Hair Reviewed  Eyes Reviewed  Mouth Reviewed  Skin Reviewed  Nails Reviewed    Diet Order:   Diet Order             Diet full liquid Room service appropriate? Yes; Fluid consistency: Thin  Diet effective now                   EDUCATION NEEDS:  No education needs have been identified at this time  Skin:  Skin Assessment: Reviewed RN Assessment  Last BM:  2/2 smear  Height:   Ht Readings from Last 1 Encounters:  03/01/21 5' (1.524 m)    Weight:   Wt Readings from Last 1 Encounters:  03/01/21 39.1 kg    Ideal Body Weight:   45 kg  BMI:  Body mass index is 16.83 kg/m.  Estimated Nutritional Needs:   Kcal:  1200-1300  Protein:  55-60 gr  Fluid:  1200   Royann Shivers MS,RD,CSG,LDN Contact: AMION

## 2021-03-02 NOTE — Evaluation (Signed)
Occupational Therapy Evaluation Patient Details Name: EMELEE RODOCKER MRN: [id number] DOB: 1927-09-07 Today's Date: 03/02/2021   History of Present Illness Charmian Forbis  is a 88 y.o. female, with past medical history of hypertension, hypothyroidism, hyperlipidemia, GERD, history of fall and subdural hematoma s/p surgical intervention many years ago, patient usually lives home, with herself, she is with fall recently January 25, for which she had no injuries, family has been staying with her since.  -Patient was brought by her family due to multiple complaints, mainly weight loss, failure to thrive, very poor oral intake and appetite, and increased lethargy where she is sleeping most of the day, as well she has been less interactive, as well she is having difficulty swallowing which is progressive over weeks, she cannot swallow any solids at this point, even pured has to be warted before she can swallow any, but she is still able to swallow some liquids, family called her family physician today, and he offered hospice, and family member wanted her to be medically evaluated in person prior to initiating those measures.   Clinical Impression   Pt agreeable to OT and PT co-evaluation. Pt presents with general weakness of B UE. Pt requires min G to min A for bed mobility with increased low back pain. Pt is able to stand and ambulate within the room with min G to min A due to labored movement and general weakness. Pt required moderate assist for lower body dressing to doff and don socks seated in chair with lateral leans. Pt reports blindness in R eye with L eye vision increasing in deficits as well. Pt left on potty chair with call bell within reach and family outside the door. NT notified pt needs assist once finished on the [facility]. Pt will benefit from continued OT in the hospital and recommended venue below to increase strength, balance, and endurance for safe ADL's.        Recommendations for follow  up therapy are one component of a multi-disciplinary discharge planning process, led by the attending physician.  Recommendations may be updated based on patient status, additional functional criteria and insurance authorization.   Follow Up Recommendations  Skilled nursing-short term rehab (<3 hours/day)    Assistance Recommended at Discharge Frequent or constant Supervision/Assistance  Patient can return home with the following A little help with walking and/or transfers;A lot of help with bathing/dressing/bathroom;Assistance with cooking/housework;Assist for transportation;Help with stairs or ramp for entrance    Functional Status Assessment  Patient has had a recent decline in their functional status and demonstrates the ability to make significant improvements in function in a reasonable and predictable amount of time.  Equipment Recommendations  BSC/3in1    Recommendations for Other Services       Precautions / Restrictions Precautions Precautions: Fall Restrictions Weight Bearing Restrictions: No      Mobility Bed Mobility Overal bed mobility: Needs Assistance Bed Mobility: Supine to Sit     Supine to sit: Min guard, Min assist     General bed mobility comments: Pt reported increase low back pain.    Transfers Overall transfer level: Needs assistance Equipment used: Rolling walker (2 wheels) Transfers: Sit to/from Stand, Bed to chair/wheelchair/BSC Sit to Stand: Min guard, Min assist     Step pivot transfers: Min guard, Min assist     General transfer comment: slow labored movement with use of RW      Balance Overall balance assessment: Needs assistance Sitting-balance support: No upper extremity supported, Feet supported Sitting  balance-Leahy Scale: Fair Sitting balance - Comments: fair to good seated EOB   Standing balance support: Bilateral upper extremity supported, During functional activity, Reliant on assistive device for balance Standing  balance-Leahy Scale: Poor Standing balance comment: using RW                           ADL either performed or assessed with clinical judgement   ADL Overall ADL's : Needs assistance/impaired     Grooming: Standing;Min guard;Minimal assistance       Lower Body Bathing: Moderate assistance;Sitting/lateral leans   Upper Body Dressing : Set up;Sitting   Lower Body Dressing: Moderate assistance;Sitting/lateral leans Lower Body Dressing Details (indicate cue type and reason): Pt able to doff and don R sock but struggle with L sock while seated in chair. Toilet Transfer: Min guard;Minimal assistance;Rolling walker (2 wheels);Ambulation;Stand-pivot Toilet Transfer Details (indicate cue type and reason): Pt able to [facility] with min G to min A with RW. Toileting- Clothing Manipulation and Hygiene: Moderate assistance;Sitting/lateral lean       Functional mobility during ADLs: Min guard;Minimal assistance;Rolling walker (2 wheels) General ADL Comments: Pt is very weak needing extended time for ADL's. Unsteady on feet.     Vision Baseline Vision/History: 1 Wears glasses;2 Legally blind (Blind in R eye with reports that L eye is "fading fast.") Ability to See in Adequate Light: 2 Moderately impaired Patient Visual Report: No change from baseline Vision Assessment?:  (Baseline fairly severe baseline deficits.)                Pertinent Vitals/Pain Pain Assessment Pain Assessment: Faces Faces Pain Scale: Hurts little more Pain Location: low back during bed mobility Pain Descriptors / Indicators: Guarding Pain Intervention(s): Limited activity within patient's tolerance, Monitored during session, Repositioned     Hand Dominance Right   Extremity/Trunk Assessment Upper Extremity Assessment Upper Extremity Assessment: Generalized weakness   Lower Extremity Assessment Lower Extremity Assessment: Defer to PT evaluation   Cervical / Trunk  Assessment Cervical / Trunk Assessment: Kyphotic   Communication Communication Communication: HOH   Cognition Arousal/Alertness: Awake/alert Behavior During Therapy: WFL for tasks assessed/performed Overall Cognitive Status: Within Functional Limits for tasks assessed                                                        Home Living Family/patient expects to be discharged to:: Private residence Living Arrangements: Alone Available Help at Discharge: Family;Available 24 hours/day (recently family has been assisting more) Type of Home: House Home Access: Stairs to enter [employer] CorporationEntrance Stairs-Number of Steps: 1   Home Layout: One level     Bathroom Shower/Tub: Chief Strategy OfficerTub/shower unit   Bathroom Toilet: Handicapped height Bathroom Accessibility: Yes How Accessible: Accessible via walker Home Equipment: Rollator (4 wheels);Wheelchair - manual;Grab bars - tub/shower          Prior Functioning/Environment Prior Level of Function : Needs assist       Physical Assist : Mobility (physical);ADLs (physical) Mobility (physical): Transfers;Gait;Stairs;Bed mobility ADLs (physical): Grooming;Bathing;Dressing;Toileting;IADLs Mobility Comments: Prior to a week ago pt was mostly independent. This past week pt has required assist for transfers using rollator. ADLs Comments: Pt was mostly indpendent up until a week ago. Since then she has needed a lot of assistnace ofr ADL's and IADL's.  OT Problem List: Decreased strength;Decreased activity tolerance;Impaired balance (sitting and/or standing);Impaired vision/perception      OT Treatment/Interventions: Self-care/ADL training;Therapeutic exercise;Energy conservation;DME and/or AE instruction;Therapeutic activities;Visual/perceptual remediation/compensation;Patient/family education;Balance training    OT Goals(Current goals can be found in the care plan section) Acute Rehab OT Goals Patient Stated Goal: Family interested  in rehab for pt. OT Goal Formulation: With patient/family Time For Goal Achievement: 03/16/21 Potential to Achieve Goals: Good  OT Frequency: Min 2X/week    Co-evaluation PT/OT/SLP Co-Evaluation/Treatment: Yes Reason for Co-Treatment: To address functional/ADL transfers   OT goals addressed during session: ADL's and self-care                       End of Session Equipment Utilized During Treatment: Rolling walker (2 wheels) Nurse Communication: Other (comment) (NT notified pt was having a bowel movment and would need assist.)  Activity Tolerance: Patient tolerated treatment well Patient left: with family/visitor present;with call bell/phone within reach;Other (comment) (Pt left on BSC with NT notfied and call bell within reach. Son outside the door.)  OT Visit Diagnosis: Unsteadiness on feet (R26.81);Other abnormalities of gait and mobility (R26.89);Repeated falls (R29.6);History of falling (Z91.81);Muscle weakness (generalized) (M62.81);Adult, failure to thrive (R62.7)                Time: 1191-4782 OT Time Calculation (min): 26 min Charges:  OT General Charges $OT Visit: 1 Visit OT Evaluation $OT Eval Low Complexity: 1 Low  Isys Tietje OT, MOT  Danie Chandler 03/02/2021, 9:53 AM

## 2021-03-02 NOTE — Progress Notes (Signed)
PROGRESS NOTE  Melissa Bentley DOB: Oct 14, 1927   PCP: Richardean Chimeraaniel, [name], MD  Patient is from: Home.  Lives alone.  Uses cane, walker and wheelchair at baseline.  DOA: 03/01/2021 LOS: 1  Chief complaints:  Chief Complaint  Patient presents with   Weakness     Brief Narrative / Interim history: [name] with PMH of remote CAD/stent, HTN, HLD, hypothyroidism, GERD and remote subdural hematoma s/p evacuation brought to ED due to poor p.o. intake, weight loss, lethargy and dysphagia, and admitted for failure to thrive, dysphagia, AKI, and hypoglycemia.  She also had a fall at home on 02/21/2021.  In ED, bradycardic to 40s and 50s.  Na 128. Cr 1.56.  BUN 21.  Bicarb 17.  AG 16.  AST 43. Hgb 11.4.  MCV 77.3.  Platelets clumped.  CBG 55.  TSH 0.36.  UA negative.  CXR and CTA head and neck without acute finding.  Patient was started on D10.  GI and palliative medicine consulted.  Esophagram with substantial esophageal dysmotility disorder concerning for presbyesophagus, epiphrenic diverticula or gastric diverticula, hiatal hernia and possible food in esophagus.   Plan for EGD on 2/4.   Subjective: Seen and examined this afternoon.  No major events overnight of this morning.  Continues to endorse dysphagia.  She says she feels like food is stuck in her throat.  She also reports nausea and diarrhea.  No emesis or abdominal pain.  Likes to have Imodium.  Sore in her back that she attributes to lying in bed.  Objective: Vitals:   03/01/21 2106 03/02/21 0159 03/02/21 0559 03/02/21 1241  BP: 110/61 (!) 103/52 109/61 117/73  Pulse: (!) 49 (!) 53 65 72  Resp:  16  15  Temp: (!) 97.5 F (36.4 C) 97.7 F (36.5 C) 97.8 F (36.6 C) (!) 97.5 F (36.4 C)  TempSrc: Oral Oral Oral Oral  SpO2: 90% 99% 97% 98%  Weight: 39.1 kg     Height: 5' (1.524 m)       Examination:  GENERAL: Frail looking elderly female.  Nontoxic. HEENT: MMM.  Vision and hearing grossly intact.  NECK:  Supple.  No apparent JVD.  RESP: 98% on RA.  No IWOB.  Fair aeration bilaterally. CVS:  RRR. Heart sounds normal.  ABD/GI/GU: BS+. Abd soft, NTND.  MSK/EXT:  Moves extremities.  Significant muscle mass and subcu fat loss. SKIN: no apparent skin lesion or wound NEURO: Awake, alert and oriented fairly.  No apparent focal neuro deficit. PSYCH: Calm. Normal affect.   Procedures:  None  Microbiology summarized: COVID-19 and influenza PCR nonreactive.  Assessment and plan Failure to thrive/severe malnutrition/hypoglycemia: Presented with poor p.o. intake, unintentional weight loss and physical deconditioning.  Hypoglycemic to 55 in ED. Some evidence of starvation ketosis on blood work.  Her latest weight before this admission was in 04/2019 when she weighed 46.7 kg.  She lost about 7.5 kg since then. -Manage dysphagia as below -Appreciate input by dietitian -Goal of care discussion per palliative medicine.  -D5-NS-KCl at 75 cc an hour  Dysphagia-SLP recommended full liquid diet.  Esophagram with substantial esophageal dysmotility disorder concerning for presbyesophagus, epiphrenic diverticula or gastric diverticula, hiatal hernia and possible food in esophagus.  -Appreciate help by GI -Continue PPI -Plan for EGD on 2/4  Nausea/diarrhea: Loose stool this morning.  Likely from contrast for esophagram.  Abdominal exam benign. -Zofran as needed -Imodium as needed -IV fluid as above  AKI: Likely prerenal from poor p.o. intake and  dehydration.  Last known Cr 0.67 in 01/2019.  Improving. Recent Labs    03/01/21 1550 03/02/21 0430  BUN 21 17  CREATININE 1.56* 1.38*  -IVF as above.  Anion gap metabolic acidosis: Likely starvation ketosis.  Resolved.  Hyponatremia: Likely due to poor p.o. intake and dehydration.  Improving. -Continue monitoring  Hypokalemia: K2.8.  Was normal on admission.  Refeeding syndrome?  Mg 1.9. -P.o. KCl 40x2  Elevated AST -Check CK  Essential  hypertension/sinus bradycardia: Normotensive.  Bradycardia likely due to metoprolol but resolved. -Resume home metoprolol at reduced dose -Hold home Lasix  Hypothyroidism: TSH 0.36 -Continue home Synthroid  History of CAD/remote stent: No anginal symptoms. -Continue home metoprolol and statin -We will resume aspirin if okay from GI standpoint  Normocytic anemia: Baseline Hgb 11-12 Recent Labs    03/01/21 1550 03/02/21 0430  HGB 11.4* 9.9*  -Check anemia panel -Continue monitoring  -Check anemia panel in the morning.  Goal of care counseling: Appropriately DNR/DNI. -Palliative medicine on board  Mood disorder -Continue home meds.  Physical deconditioning -PT/OT eval  Failure to thrive/severe malnutrition Body mass index is 16.83 kg/m. Nutrition Problem: Severe Malnutrition Etiology: acute illness (swallow problem) Signs/Symptoms: energy intake < or equal to 50% for > or equal to 5 days, moderate muscle depletion, moderate fat depletion, mild fat depletion, severe muscle depletion   DVT prophylaxis:  SCDs Start: 03/01/21 2051  Code Status: DNR/DNI Family Communication: Updated patient's son at bedside. Level of care: Med-Surg Status is: Inpatient  Remains inpatient appropriate because: Failure to thrive, significant electrolyte derangements and further evaluation for dysphagia           Consultants:  Gastroenterology Palliative medicine   Sch Meds:  Scheduled Meds:  atorvastatin  40 mg Oral q1800   dextrose  1 ampule Intravenous Once   DULoxetine  30 mg Oral Daily   feeding supplement  1 Container Oral TID BM   feeding supplement  237 mL Oral BID BM   levothyroxine  50 mcg Oral Daily   multivitamin with minerals  1 tablet Oral Daily   pantoprazole  40 mg Oral Daily   Continuous Infusions:  dextrose 30 mL/hr at 03/01/21 2300   PRN Meds:.acetaminophen (TYLENOL) oral liquid 160 mg/5 mL  Antimicrobials: Anti-infectives (From admission, onward)     None        I have personally reviewed the following labs and images: CBC: Recent Labs  Lab 03/01/21 1550 03/02/21 0430  WBC 5.3 4.1  NEUTROABS 3.6  --   HGB 11.4* 9.9*  HCT 38.5 33.2*  MCV 77.3* 75.3*  PLT ACLMP 133*   BMP &GFR Recent Labs  Lab 03/01/21 1550 03/02/21 0430  NA 128* 131*  K 4.1 2.8*  CL 95* 101  CO2 17* 22  GLUCOSE 93 124*  BUN 21 17  CREATININE 1.56* 1.38*  CALCIUM 9.3 8.4*  MG 2.3 1.9   Estimated Creatinine Clearance: 16.4 mL/min (A) (by C-G formula based on SCr of 1.38 mg/dL (H)). Liver & Pancreas: Recent Labs  Lab 03/01/21 1550  AST 43*  ALT 13  ALKPHOS 125  BILITOT 0.9  PROT 7.8  ALBUMIN 4.3   No results for input(s): LIPASE, AMYLASE in the last 168 hours. No results for input(s): AMMONIA in the last 168 hours. Diabetic: No results for input(s): HGBA1C in the last 72 hours. Recent Labs  Lab 03/02/21 0156 03/02/21 0348 03/02/21 0554 03/02/21 0753 03/02/21 1042  GLUCAP 106* 124* 120* 122* 126*   Cardiac Enzymes: No  results for input(s): CKTOTAL, CKMB, CKMBINDEX, TROPONINI in the last 168 hours. No results for input(s): PROBNP in the last 8760 hours. Coagulation Profile: No results for input(s): INR, PROTIME in the last 168 hours. Thyroid Function Tests: Recent Labs    03/01/21 2147 03/02/21 0430  TSH 0.237*  --   FREET4  --  1.25*   Lipid Profile: No results for input(s): CHOL, HDL, LDLCALC, TRIG, CHOLHDL, LDLDIRECT in the last 72 hours. Anemia Panel: No results for input(s): VITAMINB12, FOLATE, FERRITIN, TIBC, IRON, RETICCTPCT in the last 72 hours. Urine analysis:    Component Value Date/Time   COLORURINE YELLOW 03/01/2021 1757   APPEARANCEUR CLEAR 03/01/2021 1757   LABSPEC 1.015 03/01/2021 1757   PHURINE 6.0 03/01/2021 1757   GLUCOSEU NEGATIVE 03/01/2021 1757   HGBUR NEGATIVE 03/01/2021 1757   BILIRUBINUR NEGATIVE 03/01/2021 1757   KETONESUR NEGATIVE 03/01/2021 1757   PROTEINUR NEGATIVE 03/01/2021 1757    NITRITE NEGATIVE 03/01/2021 1757   LEUKOCYTESUR NEGATIVE 03/01/2021 1757   Sepsis Labs: Invalid input(s): PROCALCITONIN, LACTICIDVEN  Microbiology: No results found for this or any previous visit (from the past 240 hour(s)).  Radiology Studies: DG Knee 1-2 Views Left  Result Date: 03/01/2021 CLINICAL DATA:  Larey Seat yesterday, left knee pain anteriorly EXAM: LEFT KNEE - 1-2 VIEW COMPARISON:  02/21/2021 FINDINGS: Frontal and lateral views of the left knee are obtained. No acute fracture, subluxation, or dislocation. No joint effusion. Soft tissues are unremarkable. Diffuse vascular calcifications again noted. IMPRESSION: 1. No acute bony abnormality. Electronically Signed   By: Sharlet Salina M.D.   On: 03/01/2021 19:47   CT HEAD WO CONTRAST ( )  Result Date: 03/01/2021 CLINICAL DATA:  Neuro deficit, neurodegenerative disorder suspected WEAKNESS, DYSPHAGIA EXAM: CT HEAD WITHOUT CONTRAST TECHNIQUE: Contiguous axial images were obtained from the base of the skull through the vertex without intravenous contrast. RADIATION DOSE REDUCTION: This exam was performed according to the departmental dose-optimization program which includes automated exposure control, adjustment of the mA and/or kV according to patient size and/or use of iterative reconstruction technique. COMPARISON:  CT head 12/15/2020, CT head 9/8/ 2019 BRAIN: BRAIN Cerebral ventricle sizes are concordant with the degree of cerebral volume loss. Patchy and confluent areas of decreased attenuation are noted throughout the deep and periventricular white matter of the cerebral hemispheres bilaterally, compatible with chronic microvascular ischemic disease. Redemonstration of right temporoparietal lobe encephalomalacia. No evidence of large-territorial acute infarction. No parenchymal hemorrhage. No mass lesion. Postsurgical changes along the right calvarial convexity with no definite extra-axial collection. No mass effect or midline shift. No  hydrocephalus. Basilar cisterns are patent. Vascular: No hyperdense vessel. Atherosclerotic calcifications are present within the cavernous internal carotid and vertebral arteries. Skull: Chronic sclerotic lesion along the left sphenoid greater wing (3:20). No acute fracture or focal lesion. Prior right craniotomy surgical changes. Sinuses/Orbits: Paranasal sinuses and mastoid air cells are clear. Bilateral lens replacement. Otherwise the orbits are unremarkable. Other: None. IMPRESSION: No acute intracranial abnormality. Electronically Signed   By: Tish Frederickson M.D.   On: 03/01/2021 20:21   CT Soft Tissue Neck Wo Contrast  Result Date: 03/01/2021 CLINICAL DATA:  Provided history: Foreign body suspected, neck, negative x-ray. Additional history provided: Patient reports unable to swallow. EXAM: CT NECK WITHOUT CONTRAST TECHNIQUE: Multidetector CT imaging of the neck was performed following the standard protocol without intravenous contrast. RADIATION DOSE REDUCTION: This exam was performed according to the departmental dose-optimization program which includes automated exposure control, adjustment of the mA and/or kV according to patient size  and/or use of iterative reconstruction technique. COMPARISON:  None. FINDINGS: Pharynx and larynx: The patient is edentulous. No appreciable swelling or discrete mass within the oral cavity, pharynx or larynx on this non-contrast examination. Punctate postinflammatory calcifications/tonsilliths within the palatine tonsils, bilaterally. No foreign body is identified within the visualized aerodigestive tract. Salivary glands: No inflammation, mass, or stone. Atrophic parotid and submandibular glands. Thyroid: The gland is small, but otherwise unremarkable. Lymph nodes: No pathologically enlarged cervical chain lymph nodes are identified. Vascular: There is limited evaluation of the major vascular structures of the neck in the absence of intravenous contrast. Calcified  atherosclerotic plaque within the visualized aortic arch, proximal major branch vessels of the neck and carotid arteries. Limited intracranial: Separately reported on concurrently performed non-contrast head CT. Visualized orbits: The orbits are excluded from the field of view. Mastoids and visualized paranasal sinuses: The frontal and ethmoid sinuses are excluded from the field of view. The sphenoid and maxillary sinuses are partially imaged. No significant paranasal sinus disease or mastoid effusion at the imaged levels. Skeleton: Cervical spondylosis. No acute bony abnormality or aggressive osseous lesion. Upper chest: Advanced centrilobular emphysema. Scarring within the upper lobes, bilaterally. IMPRESSION: No foreign body is identified within the imaged portions of the aerodigestive tract. No appreciable swelling or discrete mass within the oral cavity, pharynx or larynx on this non-contrast examination. Punctate postinflammatory calcifications/tonsilliths within the palatine tonsils, bilaterally. Cervical spondylosis. Aortic Atherosclerosis (ICD10-I70.0) and Emphysema (ICD10-J43.9). Bilateral upper lobe pulmonary scarring. Electronically Signed   By: Jackey Loge D.O.   On: 03/01/2021 20:25   DG Chest Port 1 View  Result Date: 03/01/2021 CLINICAL DATA:  weakness EXAM: PORTABLE CHEST 1 VIEW COMPARISON:  None. FINDINGS: The heart and mediastinal contours are normal. Aortic calcification. Hyperinflation of the lungs. Biapical pleural/pulmonary scarring. Surgical staples noted overlying the left apex. No focal consolidation. Chronically coarsened insert markings with no overt pulmonary edema. No pleural effusion. No pneumothorax. No acute osseous abnormality. IMPRESSION: 1. No active disease. 2. Aortic Atherosclerosis (ICD10-I70.0) and Emphysema (ICD10-J43.9). Electronically Signed   By: Tish Frederickson M.D.   On: 03/01/2021 15:46   DG ESOPHAGUS W SINGLE CM (SOL OR THIN BA)  Result Date: 03/02/2021 CLINICAL  DATA:  Difficulty swallowing, especially food. EXAM: ESOPHOGRAM/BARIUM SWALLOW TECHNIQUE: Single contrast examination was performed using  thin barium. FLUOROSCOPY: Radiation Exposure Index (as provided by the fluoroscopic device): 14.7 mGy Kerma COMPARISON:  CT neck 03/01/2021 FINDINGS: The patient has substantial infirmity partially due to pelvic fractures. As result, the exam was primarily carried out in the LPO position although some supine and RPO positioning was also utilized to better assess findings at the gastroesophageal junction. Because of the limitations in positioning and the patient's inability to stand, the pharyngeal phase of swallowing is only assessed in a very limited fashion. On initial images there are postoperative findings in the left lung apex. Atherosclerotic calcification of the aortic arch. Substantial presbyesophagus appearance with corkscrew contour the esophagus and extensive tertiary contractions. Primary peristaltic waves are disrupted in the mid upper esophagus on all swallows, with bolus propulsion mostly from secondary contractions superimposed on the tertiary contractions. On image 35 of series 4, concern is raised for a filling defect in the upper thoracic esophagus just above the level of the aortic arch. The esophagram appearance is concerning for a mass, but I do not see a well-defined mass on the neck CT of 03/01/2021 and it is possible that this filling defect might be from a food bolus somewhat stuck in  this location. The corkscrew esophagus leads into an unusual appearance the gastroesophageal junction. Initially for example on image 40 of series 2, I thought this might be due to a cluster of epiphrenic diverticula, but with further filling an using different angulations, I suspect that there is probably a moderate-sized hiatal hernia, possibly with gastric and adjacent epiphrenic diverticulum. Moreover, on RPO images such as image 52 of series 8, there seems to be a  potential masslike filling defect along the left side of this potential hiatal hernia. Conceivably this could be from a partially unwrapped fundoplication but a tumor within a hiatal hernia is a differential diagnostic concern. IMPRESSION: 1. Substantial esophageal dysmotility disorder with disruption of primary peristaltic waves and extensive tertiary and some secondary contractions. Corkscrew appearance of the esophagus compatible with presbyesophagus. 2. In the vicinity of the gastroesophageal junction, there is an appearance of 2-3 unusual outpouchings of variable size with possibilities including epiphrenic diverticula, or gastric diverticula associated with a hiatal hernia. On series 8, the possibility of an irregular 1.7 cm in diameter filling defect within this presumed hiatal hernia is raised, and malignancy is a possibility. Endoscopy is likely warranted. 3. There is also a filling defect along the luminal margins of the upper thoracic esophagus just above the aorta, but we have cross-sectional imaging of the neck from 03/01/2021 through this region which did not reveal a mass, and accordingly it is possible that this represents some stuck food in the esophagus forming the filling defect. This could also be assessed at endoscopy. Electronically Signed   By: Gaylyn RongWalter  Liebkemann M.D.   On: 03/02/2021 10:28       Kelsy Polack T. Burman [facility]  If 7PM-7AM, please contact night-coverage [email or web address] 03/02/2021, 1:05 PM

## 2021-03-02 NOTE — Plan of Care (Signed)
°  Problem: Acute Rehab PT Goals(only PT should resolve) Goal: Pt Will Go Supine/Side To Sit Outcome: Progressing Flowsheets (Taken 03/02/2021 1419) Pt will go Supine/Side to Sit:  with modified independence  with supervision Goal: Patient Will Transfer Sit To/From Stand Outcome: Progressing Flowsheets (Taken 03/02/2021 1419) Patient will transfer sit to/from stand:  with min guard assist  with minimal assist Goal: Pt Will Transfer Bed To Chair/Chair To Bed Outcome: Progressing Flowsheets (Taken 03/02/2021 1419) Pt will Transfer Bed to Chair/Chair to Bed:  min guard assist  with min assist Goal: Pt Will Ambulate Outcome: Progressing Flowsheets (Taken 03/02/2021 1419) Pt will Ambulate:  50 feet  with min guard assist  with minimal assist  with rolling walker   2:19 PM, 03/02/21 Ocie Bob, MPT Physical Therapist with [facility] [phone] office [phone] mobile phone

## 2021-03-02 NOTE — Consult Note (Addendum)
Referring Provider: Dr. Landis Gandy Primary Care Physician:  Caryl Bis, MD Primary Gastroenterologist:  Dr. Gala Romney  Date of Admission: 03/01/21 Date of Consultation: 03/02/21  Reason for Consultation:  Dysphagia   HPI:  Melissa Bentley is a 88 y.o. year old female last seen by [facility] in 2017, presenting to the hospital yesterday with failure to thrive, difficulty eating and drinking, with hospice recommended by PCP but family desiring medical evaluation. History significant for COVID 2.5 years ago and then a month ago. GI consulted due to dysphagia.   CT soft tissue neck without contrast and CT head without contrast completed yesterday without acute findings. UGI exam limited due to pelvic fractures. Substantial esophageal dysmotility noted. 2-3 outpuchings near GE junction with differentials including diverticula, possible gastric diverticula associated with hiatal hernia. Possible filling defect within the hiatal hernia. Unable to exclude malignancy. Filling defect upper thoracic esophagus as well. Possibility of food bolus at this region.   Son, [name], is at the bedside. Patient has noted difficulty swallowing anything other than liquids for several months. States she will swallow softer/solid foods and get stuck and points to her neck. Has to regurgitate and spit out. Tried to eat a jelly sandwich last night but just sucked on the food and spit out the remnants. No abdominal pain. She has had difficulty walking with walker recently and overall decline. Fell on 1/25. History of pelvic fracture end of last year. Has been feeling weaker lately. Occasional diarrhea but not often. No overt GI bleeding. No odynophagia.     Past Medical History:  Diagnosis Date   CAD (coronary artery disease)    Depression    GERD (gastroesophageal reflux disease)    Hyperlipidemia    Hypertension    Hypothyroidism    MI (myocardial infarction) ([place])    Osteoporosis    Psoriasis    PVD (peripheral  vascular disease) ([place])     Past Surgical History:  Procedure Laterality Date   ABDOMINAL HYSTERECTOMY     CATARACT EXTRACTION Bilateral    COLONOSCOPY N/A 02/01/2015   Dr. Rourk:colonic diverticulosis s/p biopsy with unremarkable colonic mucosa, no microscopic colitis.    CORONARY ANGIOPLASTY WITH STENT PLACEMENT     CRANIOTOMY Right 10/03/2017   Procedure: CRANIOTOMY HEMATOMA EVACUATION SUBDURAL;  Surgeon: Ashok Pall, MD;  Location: [place];  Service: Neurosurgery;  Laterality: Right;   CRANIOTOMY Right 10/05/2017   Procedure: REDO CRANIOTOMY HEMATOMA EVACUATION SUBDURAL;  Surgeon: Ashok Pall, MD;  Location: [place];  Service: Neurosurgery;  Laterality: Right;   EYE SURGERY     LUNG LOBECTOMY     SUBCLAVIAN ARTERY STENT     TUBAL LIGATION      Prior to Admission medications   Medication Sig Start Date End Date Taking? Authorizing Provider  aspirin 81 MG tablet Take 81 mg by mouth daily. Reported on 04/27/2015   Yes [provider]  atorvastatin (LIPITOR) 40 MG tablet Take 40 mg by mouth daily at 6 PM.  01/05/15  Yes [provider]  cilostazol (PLETAL) 100 MG tablet Take 100 mg by mouth 2 (two) times daily.  01/05/15  Yes [provider]  DULoxetine (CYMBALTA) 30 MG capsule Take 30 mg by mouth daily. 02/13/21  Yes [provider]  furosemide (LASIX) 20 MG tablet Take 20 mg by mouth every morning. 02/13/21  Yes [provider]  levothyroxine (SYNTHROID) 50 MCG tablet Take 50 mcg by mouth daily. 07/01/18  Yes [provider]  metoprolol tartrate (LOPRESSOR) 25 MG  tablet 25 mg 2 (two) times daily.  09/30/18  Yes [provider]  pantoprazole (PROTONIX) 40 MG tablet Take 40 mg by mouth daily.   Yes [provider]  potassium chloride (KLOR-CON) 10 MEQ tablet Take 20 mEq by mouth daily. 02/13/21  Yes [provider]  traZODone (DESYREL) 50 MG tablet Take 100 mg by mouth at bedtime. 07/01/18  Yes [provider]     Current Facility-Administered Medications  Medication Dose Route Frequency Provider Last Rate Last Admin   acetaminophen (TYLENOL) 160 MG/5ML solution 325 mg  325 mg Oral Q4H PRN Elgergawy, Silver Huguenin, MD       atorvastatin (LIPITOR) tablet 40 mg  40 mg Oral q1800 Elgergawy, Silver Huguenin, MD       dextrose 10 % infusion   Intravenous Continuous Elgergawy, Silver Huguenin, MD 30 mL/hr at 03/01/21 2300 Rate Change at 03/01/21 2300   dextrose 50 % solution 50 mL  1 ampule Intravenous Once Noemi Chapel, MD       DULoxetine (CYMBALTA) DR capsule 30 mg  30 mg Oral Daily Elgergawy, Silver Huguenin, MD   30 mg at 03/02/21 [id number]   feeding supplement (BOOST / RESOURCE BREEZE) liquid 1 Container  1 Container Oral TID BM Elgergawy, Silver Huguenin, MD       feeding supplement (ENSURE ENLIVE / ENSURE PLUS) liquid 237 mL  237 mL Oral BID BM Elgergawy, Silver Huguenin, MD   237 mL at 03/02/21 0931   levothyroxine (SYNTHROID) tablet 50 mcg  50 mcg Oral Daily Elgergawy, Silver Huguenin, MD   50 mcg at 03/02/21 0555   pantoprazole (PROTONIX) EC tablet 40 mg  40 mg Oral Daily Elgergawy, Silver Huguenin, MD   40 mg at 03/02/21 [id number]   potassium chloride (KLOR-CON) packet 40 mEq  40 mEq Oral Q4H Wendee Beavers T, MD   40 mEq at 03/02/21 [id number]    Allergies as of 03/01/2021   (No Known Allergies)    Family History  Problem Relation Age of Onset   Lung cancer Sister    Macular degeneration Sister    Colon cancer Neg Hx     Social History   Socioeconomic History   Marital status: Widowed    Spouse name: Not on file   Number of children: 2   Years of education: colllege education   Highest education level: Associate degree: occupational, Hotel manager, or vocational program  Occupational History   Occupation: retired Quarry manager    Comment: disabled   Tobacco Use   Smoking status: Former    Types: Cigarettes    Quit date: 01/17/1996    Years since quitting: 25.1   Smokeless tobacco: Never  Vaping Use   Vaping Use: Never used  Substance and Sexual  Activity   Alcohol use: No    Alcohol/week: 0.0 standard drinks   Drug use: No   Sexual activity: Not on file  Other Topics Concern   Not on file  Social History Narrative   Not on file   Social Determinants of Health   Financial Resource Strain: Not on file  Food Insecurity: Not on file  Transportation Needs: Not on file  Physical Activity: Not on file  Stress: Not on file  Social Connections: Not on file  Intimate Partner Violence: Not on file    Review of Systems: Gen: see HPI CV: Denies chest pain, heart palpitations, syncope, edema  Resp: Denies shortness of breath with rest, cough, wheezing GI: see HPI GU : Denies urinary burning,  urinary frequency, urinary incontinence.  MS: see HPI Derm: Denies rash, itching, dry skin Psych: Denies depression, anxiety,confusion, or memory loss Heme: see HPI  Physical Exam: Vital signs in last 24 hours: Temp:  [96.8 F (36 C)-97.8 F (36.6 C)] 97.8 F (36.6 C) (02/03 0559) Pulse Rate:  [42-111] 65 (02/03 0559) Resp:  [15-29] 16 (02/03 0159) BP: (72-141)/(43-79) 109/61 (02/03 0559) SpO2:  [72 %-99 %] 97 % (02/03 0559) Weight:  [39.1 kg-46.7 kg] 39.1 kg (02/02 2106) Last BM Date: 03/01/21 General:   Alert,  thin, frail. Sitting up in chair.  Head:  Normocephalic and atraumatic. Eyes:  Sclera clear, no icterus.    Ears:  hard of hearing Nose:  No deformity, discharge,  or lesions. Mouth:  edentulous Lungs:  Clear throughout to auscultation.    Heart:  S1 S2 present without obvious murmurs Abdomen:  sitting up in chair, exam limited. Soft, nontender and nondistended. Thin.  Extremities:  Without edema. Neurologic:  Alert and  oriented x4 Psych:  Normal mood and affect.  Intake/Output from previous day: 02/02 0701 - 02/03 0700 In: 360 [P.O.:240; I.V.:120] Out: 500 [Urine:500] Intake/Output this shift: No intake/output data recorded.  Lab Results: Recent Labs    03/01/21 1550 03/02/21 0430  WBC 5.3 4.1  HGB 11.4*  9.9*  HCT 38.5 33.2*  PLT ACLMP 133*   BMET Recent Labs    03/01/21 1550 03/02/21 0430  NA 128* 131*  K 4.1 2.8*  CL 95* 101  CO2 17* 22  GLUCOSE 93 124*  BUN 21 17  CREATININE 1.56* 1.38*  CALCIUM 9.3 8.4*   LFT Recent Labs    03/01/21 1550  PROT 7.8  ALBUMIN 4.3  AST 43*  ALT 13  ALKPHOS 125  BILITOT 0.9     Studies/Results: DG Knee 1-2 Views Left  Result Date: 03/01/2021 CLINICAL DATA:  [place] yesterday, left knee pain anteriorly EXAM: LEFT KNEE - 1-2 VIEW COMPARISON:  02/21/2021 FINDINGS: Frontal and lateral views of the left knee are obtained. No acute fracture, subluxation, or dislocation. No joint effusion. Soft tissues are unremarkable. Diffuse vascular calcifications again noted. IMPRESSION: 1. No acute bony abnormality. Electronically Signed   By: Randa Ngo M.D.   On: 03/01/2021 19:47   CT HEAD WO CONTRAST (5MM)  Result Date: 03/01/2021 CLINICAL DATA:  Neuro deficit, neurodegenerative disorder suspected WEAKNESS, DYSPHAGIA EXAM: CT HEAD WITHOUT CONTRAST TECHNIQUE: Contiguous axial images were obtained from the base of the skull through the vertex without intravenous contrast. RADIATION DOSE REDUCTION: This exam was performed according to the departmental dose-optimization program which includes automated exposure control, adjustment of the mA and/or kV according to patient size and/or use of iterative reconstruction technique. COMPARISON:  CT head 12/15/2020, CT head 9/8/ 2019 BRAIN: BRAIN Cerebral ventricle sizes are concordant with the degree of cerebral volume loss. Patchy and confluent areas of decreased attenuation are noted throughout the deep and periventricular white matter of the cerebral hemispheres bilaterally, compatible with chronic microvascular ischemic disease. Redemonstration of right temporoparietal lobe encephalomalacia. No evidence of large-territorial acute infarction. No parenchymal hemorrhage. No mass lesion. Postsurgical changes along the right  calvarial convexity with no definite extra-axial collection. No mass effect or midline shift. No hydrocephalus. Basilar cisterns are patent. Vascular: No hyperdense vessel. Atherosclerotic calcifications are present within the cavernous internal carotid and vertebral arteries. Skull: Chronic sclerotic lesion along the left sphenoid greater wing (3:20). No acute fracture or focal lesion. Prior right craniotomy surgical changes. Sinuses/Orbits: Paranasal sinuses and mastoid air cells  are clear. Bilateral lens replacement. Otherwise the orbits are unremarkable. Other: None. IMPRESSION: No acute intracranial abnormality. Electronically Signed   By: Iven Finn M.D.   On: 03/01/2021 20:21   CT Soft Tissue Neck Wo Contrast  Result Date: 03/01/2021 CLINICAL DATA:  Provided history: Foreign body suspected, neck, negative x-ray. Additional history provided: Patient reports unable to swallow. EXAM: CT NECK WITHOUT CONTRAST TECHNIQUE: Multidetector CT imaging of the neck was performed following the standard protocol without intravenous contrast. RADIATION DOSE REDUCTION: This exam was performed according to the departmental dose-optimization program which includes automated exposure control, adjustment of the mA and/or kV according to patient size and/or use of iterative reconstruction technique. COMPARISON:  None. FINDINGS: Pharynx and larynx: The patient is edentulous. No appreciable swelling or discrete mass within the oral cavity, pharynx or larynx on this non-contrast examination. Punctate postinflammatory calcifications/tonsilliths within the palatine tonsils, bilaterally. No foreign body is identified within the visualized aerodigestive tract. Salivary glands: No inflammation, mass, or stone. Atrophic parotid and submandibular glands. Thyroid: The gland is small, but otherwise unremarkable. Lymph nodes: No pathologically enlarged cervical chain lymph nodes are identified. Vascular: There is limited evaluation of  the major vascular structures of the neck in the absence of intravenous contrast. Calcified atherosclerotic plaque within the visualized aortic arch, proximal major branch vessels of the neck and carotid arteries. Limited intracranial: Separately reported on concurrently performed non-contrast head CT. Visualized orbits: The orbits are excluded from the field of view. Mastoids and visualized paranasal sinuses: The frontal and ethmoid sinuses are excluded from the field of view. The sphenoid and maxillary sinuses are partially imaged. No significant paranasal sinus disease or mastoid effusion at the imaged levels. Skeleton: Cervical spondylosis. No acute bony abnormality or aggressive osseous lesion. Upper chest: Advanced centrilobular emphysema. Scarring within the upper lobes, bilaterally. IMPRESSION: No foreign body is identified within the imaged portions of the aerodigestive tract. No appreciable swelling or discrete mass within the oral cavity, pharynx or larynx on this non-contrast examination. Punctate postinflammatory calcifications/tonsilliths within the palatine tonsils, bilaterally. Cervical spondylosis. Aortic Atherosclerosis (ICD10-I70.0) and Emphysema (ICD10-J43.9). Bilateral upper lobe pulmonary scarring. Electronically Signed   By: Kellie Simmering D.O.   On: 03/01/2021 20:25   DG Chest Port 1 View  Result Date: 03/01/2021 CLINICAL DATA:  weakness EXAM: PORTABLE CHEST 1 VIEW COMPARISON:  None. FINDINGS: The heart and mediastinal contours are normal. Aortic calcification. Hyperinflation of the lungs. Biapical pleural/pulmonary scarring. Surgical staples noted overlying the left apex. No focal consolidation. Chronically coarsened insert markings with no overt pulmonary edema. No pleural effusion. No pneumothorax. No acute osseous abnormality. IMPRESSION: 1. No active disease. 2. Aortic Atherosclerosis (ICD10-I70.0) and Emphysema (ICD10-J43.9). Electronically Signed   By: Iven Finn M.D.   On:  03/01/2021 15:46    Impression: 88 y.o. year old female last seen by [facility] in 2017, presenting to the hospital yesterday with failure to thrive, difficulty with oral intake, and admitted for further evaluation. CT soft tissue neck without contrast and CT head without contrast completed yesterday without acute findings. UGI exam limited due to pelvic fractures. Substantial esophageal dysmotility noted. 2-3 outpuchings near GE junction with differentials including diverticula, possible gastric diverticula associated with hiatal hernia. Possible filling defect within the hiatal hernia. Unable to exclude malignancy. Filling defect upper thoracic esophagus as well. Possibility of food bolus at this region. GI consulted due to dysphagia.   Dysphagia: present for several months. Difficulty with anything other than liquids. UGI findings concerning. Needs EGD, which will likely  be tomorrow, 2/4 with Dr. Laural Golden. I discussed this with patient and son at bedside. Discussed risks and benefits.   Microcytic anemia: Hgb 9.9 this morning, with drift from 11.4 likely dilutional. Hgb 8.2 three years ago. Will check anemia panel.   Hypokalemia: repletion per hospitalist.    Plan: Continue PPI daily Potassium repletion per hospitalist Continue clears. NPO after midnight EGD likely 03/03/20 with Dr. Laural Golden. Discussed risks and benefits with patient. Agree with palliative consultation.   Annitta Needs, PhD, ANP-BC [facility]     LOS: 1 day    03/02/2021, 9:55 AM

## 2021-03-02 NOTE — Evaluation (Signed)
Clinical/Bedside Swallow Evaluation Patient Details  Name: Melissa Bentley MRN: [id number] Date of Birth: 26-Apr-1927  Today's Date: 03/02/2021 Time: SLP Start Time (ACUTE ONLY): 29 SLP Stop Time (ACUTE ONLY): 38 SLP Time Calculation (min) (ACUTE ONLY): 18 min  Past Medical History:  Past Medical History:  Diagnosis Date   CAD (coronary artery disease)    Depression    GERD (gastroesophageal reflux disease)    Hyperlipidemia    Hypertension    Hypothyroidism    MI (myocardial infarction) ([place])    Osteoporosis    Psoriasis    PVD (peripheral vascular disease) ([place])    Past Surgical History:  Past Surgical History:  Procedure Laterality Date   ABDOMINAL HYSTERECTOMY     CATARACT EXTRACTION Bilateral    COLONOSCOPY N/A 02/01/2015   Dr. Rourk:colonic diverticulosis s/p biopsy with unremarkable colonic mucosa, no microscopic colitis.    CORONARY ANGIOPLASTY WITH STENT PLACEMENT     CRANIOTOMY Right 10/03/2017   Procedure: CRANIOTOMY HEMATOMA EVACUATION SUBDURAL;  Surgeon: Ashok Pall, MD;  Location: [place];  Service: Neurosurgery;  Laterality: Right;   CRANIOTOMY Right 10/05/2017   Procedure: REDO CRANIOTOMY HEMATOMA EVACUATION SUBDURAL;  Surgeon: Ashok Pall, MD;  Location: [place];  Service: Neurosurgery;  Laterality: Right;   EYE SURGERY     LUNG LOBECTOMY     SUBCLAVIAN ARTERY STENT     TUBAL LIGATION     HPI:  88 y.o. female, with past medical history of hypertension, hypothyroidism, hyperlipidemia, GERD, history of fall and subdural hematoma s/p surgical intervention many years ago, patient usually lives home, with herself, she is with fall recently January 25, for which she had no injuries, family has been staying with her since.  -Patient was brought by her family due to multiple complaints, mainly weight loss, failure to thrive, very poor oral intake and appetite, and increased lethargy where she is sleeping most of the day, as well she has been less interactive, as well  she is having difficulty swallowing which is progressive over weeks, she cannot swallow any solids at this point, even pured has to be warted before she can swallow any, but she is still able to swallow some liquids, family called her family physician today, and he offered hospice, and family member wanted her to be medically evaluated in person prior to initiating those measures.  - in ED work-up was significant for a creatinine of 1.5, sodium of 134, and she was hypoglycemic as well, EKG nonacute, here urine nonacute, no evidence of infection, given her hypoglycemia and dysphagia Esophagram results on 03/02/21 indicated substantial esophageal dysmotility disorder with disruption of primary peristaltic waves and extensive tertiary and some secondary contractions.  Corkscrew appearance of esophagus compatible with presbyesophagus.  CXR on 03/01/21 normal; BSE generated.   Assessment / Plan / Recommendation  Clinical Impression  Pt seen for clinical swallowing evaluation with c/o globus sensation/difficulty transitioning solids/puree into pharynx and frequent belching.  Esophagram on 2/3 revealing "substantial esophageal dysmotility." Pt has hx of GERD and esophageal issues per pt/family report.  Recent weight loss and decreased PO intake reported by family as well.  Pt consumed small amounts of jello/thin liquids without overt s/s of aspiration noted, but d/t medical issues, no other consistencies assessed at this time.  Esophageal precautions discussed with pt/family with agreement noted to utilize during PO intake.  Pending endoscopy on 03/03/21 to further assess esophagram results.  Recommend pt upgrade to full liquids if medically able to increase variety of nutritional intake.  ST  will f/u for diet progression and education re: esophageal/aspiration precautions.  Thank you for this consult. SLP Visit Diagnosis: Dysphagia, pharyngoesophageal phase (R13.14)    Aspiration Risk  Mild aspiration risk;Moderate  aspiration risk;Risk for inadequate nutrition/hydration    Diet Recommendation   Full liquids (if medically able)  Medication Administration: Whole meds with liquid (as able or in puree)    Other  Recommendations Oral Care Recommendations: Oral care BID    Recommendations for follow up therapy are one component of a multi-disciplinary discharge planning process, led by the attending physician.  Recommendations may be updated based on patient status, additional functional criteria and insurance authorization.  Follow up Recommendations Follow physician's recommendations for discharge plan and follow up therapies      Assistance Recommended at Discharge None  Functional Status Assessment Patient has had a recent decline in their functional status and demonstrates the ability to make significant improvements in function in a reasonable and predictable amount of time.  Frequency and Duration min 2x/week  1 week       Prognosis Prognosis for Safe Diet Advancement: Fair Barriers to Reach Goals: Severity of deficits      Swallow Study   General Date of Onset: 03/01/21 HPI: 88 y.o. female, with past medical history of hypertension, hypothyroidism, hyperlipidemia, GERD, history of fall and subdural hematoma s/p surgical intervention many years ago, patient usually lives home, with herself, she is with fall recently January 25, for which she had no injuries, family has been staying with her since.  -Patient was brought by her family due to multiple complaints, mainly weight loss, failure to thrive, very poor oral intake and appetite, and increased lethargy where she is sleeping most of the day, as well she has been less interactive, as well she is having difficulty swallowing which is progressive over weeks, she cannot swallow any solids at this point, even pured has to be warted before she can swallow any, but she is still able to swallow some liquids, family called her family physician today,  and he offered hospice, and family member wanted her to be medically evaluated in person prior to initiating those measures.  - in ED work-up was significant for a creatinine of 1.5, sodium of 134, and she was hypoglycemic as well, EKG nonacute, here urine nonacute, no evidence of infection, given her hypoglycemia and dysphagia Type of Study: Bedside Swallow Evaluation Previous Swallow Assessment: n/a Diet Prior to this Study: Thin liquids Temperature Spikes Noted: No Respiratory Status: Room air History of Recent Intubation: No Behavior/Cognition: Alert;Cooperative;Other (Comment);Requires cueing (HOH) Oral Cavity Assessment: Dry Oral Care Completed by SLP: Recent completion by staff Oral Cavity - Dentition: Edentulous Vision: Functional for self-feeding Self-Feeding Abilities: Able to feed self;Needs assist Patient Positioning: Upright in chair Baseline Vocal Quality: Low vocal intensity Volitional Cough: Strong Volitional Swallow: Able to elicit    Oral/Motor/Sensory Function Overall Oral Motor/Sensory Function: Within functional limits   Ice Chips Ice chips: Not tested   Thin Liquid Thin Liquid: Within functional limits Presentation: Cup Other Comments: DId not assess larger amounts d/t hx of dysphagia    Nectar Thick Nectar Thick Liquid: Not tested   Honey Thick Honey Thick Liquid: Not tested   Puree Puree: Impaired Presentation: Self Fed Pharyngeal Phase Impairments: Multiple swallows   Solid     Solid: Not tested      Elvina Sidle, M.S., CCC-SLP 03/02/2021,12:52 PM

## 2021-03-02 NOTE — NC FL2 (Signed)
[place] MEDICAID FL2 LEVEL OF CARE SCREENING TOOL     IDENTIFICATION  Patient Name: Melissa Bentley Birthdate: 21-Jul-1927 Sex: female Admission Date (Current Location): 03/01/2021  [facility] and [place] Number:  [employer] and Address:  [employer],  [address] [place] [address]      Provider Number: [id number]  Attending Physician Name and Address:  Almon Hercules, MD  Relative Name and Phone Number:  Hulen Shouts , [phone] Son    Current Level of Care: Hospital Recommended Level of Care: Skilled Nursing Facility Prior Approval Number:    Date Approved/Denied:   PASRR Number: [id number]  Discharge Plan: Home    Current Diagnoses: Patient Active Problem List   Diagnosis Date Noted   Dysphagia 03/01/2021   Failure to thrive in adult 03/01/2021   Hypoglycemia 03/01/2021   Protein-calorie malnutrition, severe (HCC) 03/01/2021   Renal insufficiency 03/01/2021   Pressure injury of skin 10/05/2017   Subdural hematoma, chronic (HCC) 10/03/2017   Diarrhea    Diverticulosis of colon without hemorrhage    Chronic diarrhea 01/17/2015    Orientation RESPIRATION BLADDER Height & Weight     Self, Time, Situation, Place  Normal External catheter Weight: 39.1 kg Height:  5' (152.4 cm)  BEHAVIORAL SYMPTOMS/MOOD NEUROLOGICAL BOWEL NUTRITION STATUS      Continent Diet (Dysphagia diet)  AMBULATORY STATUS COMMUNICATION OF NEEDS Skin     Verbally Normal                       Personal Care Assistance Level of Assistance  Bathing, Dressing, Feeding Bathing Assistance: Maximum assistance Feeding assistance: Maximum assistance Dressing Assistance: Limited assistance Total Care Assistance: Maximum assistance   Functional Limitations Info  Sight, Hearing, Speech Sight Info: Impaired Hearing Info: Impaired Speech Info: Adequate    SPECIAL CARE FACTORS FREQUENCY  PT (By licensed PT)                    Contractures  Contractures Info: Not present    Additional Factors Info  Code Status, Allergies Code Status Info: DNR Allergies Info: NKDA           Current Medications (03/02/2021):  This is the current hospital active medication list Current Facility-Administered Medications  Medication Dose Route Frequency Provider Last Rate Last Admin   acetaminophen (TYLENOL) 160 MG/5ML solution 325 mg  325 mg Oral Q4H PRN Elgergawy, Leana Roe, MD       atorvastatin (LIPITOR) tablet 40 mg  40 mg Oral q1800 Elgergawy, Dawood S, MD       dextrose 5 % and 0.9 % NaCl with KCl 20 mEq/L infusion   Intravenous Continuous Gonfa, Taye T, MD       dextrose 50 % solution 50 mL  1 ampule Intravenous Once Eber Hong, MD       DULoxetine (CYMBALTA) DR capsule 30 mg  30 mg Oral Daily Elgergawy, Leana Roe, MD   30 mg at 03/02/21 3329   feeding supplement (BOOST / RESOURCE BREEZE) liquid 1 Container  1 Container Oral TID BM Elgergawy, Leana Roe, MD       feeding supplement (ENSURE ENLIVE / ENSURE PLUS) liquid 237 mL  237 mL Oral BID BM Elgergawy, Leana Roe, MD   237 mL at 03/02/21 0931   levothyroxine (SYNTHROID) tablet 50 mcg  50 mcg Oral Daily Elgergawy, Leana Roe, MD   50 mcg at 03/02/21 0555   metoprolol tartrate (LOPRESSOR) tablet 12.5 mg  12.5 mg Oral BID Almon Hercules, MD       multivitamin with minerals tablet 1 tablet  1 tablet Oral Daily Gonfa, Taye T, MD       ondansetron (ZOFRAN) injection 4 mg  4 mg Intravenous Q8H PRN Candelaria Stagers T, MD   4 mg at 03/02/21 1312   pantoprazole (PROTONIX) EC tablet 40 mg  40 mg Oral Daily Elgergawy, Leana Roe, MD   40 mg at 03/02/21 8937     Discharge Medications: Please see discharge summary for a list of discharge medications.  Relevant Imaging Results:  Relevant Lab Results:   Additional Information [id number]  Leitha Bleak, RN

## 2021-03-02 NOTE — Plan of Care (Signed)
°  Problem: Acute Rehab OT Goals (only OT should resolve) Goal: Pt. Will Perform Grooming Flowsheets (Taken 03/02/2021 0957) Pt Will Perform Grooming:  with modified independence  standing Goal: Pt. Will Perform Lower Body Bathing Flowsheets (Taken 03/02/2021 0957) Pt Will Perform Lower Body Bathing:  with modified independence  sitting/lateral leans  with adaptive equipment Goal: Pt. Will Perform Lower Body Dressing Flowsheets (Taken 03/02/2021 0957) Pt Will Perform Lower Body Dressing:  with modified independence  sitting/lateral leans  with adaptive equipment Goal: Pt. Will Transfer To Toilet Flowsheets (Taken 03/02/2021 0957) Pt Will Transfer to Toilet:  with modified independence  ambulating Goal: Pt/Caregiver Will Perform Home Exercise Program Flowsheets (Taken 03/02/2021 0957) Pt/caregiver will Perform Home Exercise Program:  Increased strength  Both right and left upper extremity  Independently  Roneisha Stern OT, MOT

## 2021-03-02 NOTE — TOC Initial Note (Signed)
Transition of Care [facility]) - Initial/Assessment Note    Patient Details  Name: Melissa Bentley MRN: [id number] Date of Birth: 1928/01/15  [facility] CM/SW Contact:    Boneta Lucks, RN Phone Number: 03/02/2021, 2:18 PM  Clinical Narrative:   Patient admitted with Dysphagia. TOC spoke with son. Patient live home alone until a couple weeks ago. After recent hospital visit they hired a Actuary. She is unable to eat, state he esophagus is twisted. GI consulted she will have EDG over the weekend. Son has thought about hospice care but not had palliative discussions. PT/OT is recommending SNF.  Son is agreeable to SNF, she has been vaccinated and prefers [facility]. TOC sent out FL2 for bed offers. Will need to start INS Auth next week. DC plan for later next week.   Expected Discharge Plan: Skilled Nursing Facility Barriers to Discharge: Continued Medical Work up  Patient Goals and CMS Choice Patient states their goals for this hospitalization and ongoing recovery are:: agreeable to SNF CMS Medicare.gov Compare Post Acute Care list provided to:: Patient Represenative (must comment) Choice offered to / list presented to : Adult Children  Expected Discharge Plan and Services Expected Discharge Plan: [place] Acute Care Choice: [place] Living arrangements for the past 2 months: Single Family Home                    Prior Living Arrangements/Services Living arrangements for the past 2 months: Single Family Home Lives with:: Self Patient language and need for interpreter reviewed:: Yes        Need for Family Participation in Patient Care: Yes (Comment) Care giver support system in place?: Yes (comment) Current home services: DME Criminal Activity/Legal Involvement Pertinent to Current Situation/Hospitalization: No - Comment as needed  Activities of Daily Living Home Assistive Devices/Equipment: Walker (specify type) ADL Screening  (condition at time of admission) Patient's cognitive ability adequate to safely complete daily activities?: Yes Is the patient deaf or have difficulty hearing?: Yes Does the patient have difficulty seeing, even when wearing glasses/contacts?: No Does the patient have difficulty concentrating, remembering, or making decisions?: Yes Patient able to express need for assistance with ADLs?: Yes Does the patient have difficulty dressing or bathing?: Yes Independently performs ADLs?: No Communication: Independent Dressing (OT): Needs assistance Is this a change from baseline?: Pre-admission baseline Grooming: Needs assistance Is this a change from baseline?: Pre-admission baseline Feeding: Independent Bathing: Needs assistance Is this a change from baseline?: Pre-admission baseline Toileting: Needs assistance Is this a change from baseline?: Pre-admission baseline In/Out Bed: Needs assistance Is this a change from baseline?: Pre-admission baseline Walks in Home: Needs assistance Does the patient have difficulty walking or climbing stairs?: Yes Weakness of Legs: Both Weakness of Arms/Hands: None  Permission Sought/Granted      Emotional Assessment    Affect (typically observed): Accepting Orientation: : Oriented to Self, Oriented to Place, Oriented to  Time, Oriented to Situation Alcohol / Substance Use: Not Applicable Psych Involvement: No (comment)  Admission diagnosis:  Dehydration [E86.0] Weakness [R53.1] Hypoglycemia [E16.2] Pain [R52] Acute renal insufficiency [N28.9] Dysphagia [R13.10] Patient Active Problem List   Diagnosis Date Noted   Dysphagia 03/01/2021   Failure to thrive in adult 03/01/2021   Hypoglycemia 03/01/2021   Protein-calorie malnutrition, severe ([place]) 03/01/2021   Renal insufficiency 03/01/2021   Pressure injury of skin 10/05/2017   Subdural hematoma, chronic (HCC) 10/03/2017   Diarrhea    Diverticulosis of colon without  hemorrhage    Chronic  diarrhea 01/17/2015   PCP:  Caryl Bis, MD Pharmacy:   [facility] [place] [id number] [place] [id number] Phone: [phone] Fax: [phone]   Readmission Risk Interventions Readmission Risk Prevention Plan 03/02/2021  Medication Screening Complete  Transportation Screening Complete  Some recent data might be hidden

## 2021-03-03 ENCOUNTER — Encounter (HOSPITAL_COMMUNITY)
Admission: EM | Disposition: A | Discharge status: Skilled Nursing Facility | Payer: Self-pay | Source: Home / Self Care | Attending: Student

## 2021-03-03 ENCOUNTER — Inpatient Hospital Stay (HOSPITAL_COMMUNITY): Payer: Medicare Other | Admitting: Anesthesiology

## 2021-03-03 DIAGNOSIS — K224 Dyskinesia of esophagus: Secondary | ICD-10-CM

## 2021-03-03 DIAGNOSIS — E878 Other disorders of electrolyte and fluid balance, not elsewhere classified: Secondary | ICD-10-CM | POA: Diagnosis present

## 2021-03-03 DIAGNOSIS — K449 Diaphragmatic hernia without obstruction or gangrene: Secondary | ICD-10-CM

## 2021-03-03 DIAGNOSIS — R1319 Other dysphagia: Secondary | ICD-10-CM

## 2021-03-03 DIAGNOSIS — R5381 Other malaise: Secondary | ICD-10-CM

## 2021-03-03 DIAGNOSIS — E8729 Other acidosis: Secondary | ICD-10-CM | POA: Diagnosis present

## 2021-03-03 DIAGNOSIS — D61818 Other pancytopenia: Secondary | ICD-10-CM

## 2021-03-03 DIAGNOSIS — R1314 Dysphagia, pharyngoesophageal phase: Secondary | ICD-10-CM

## 2021-03-03 DIAGNOSIS — Z7189 Other specified counseling: Secondary | ICD-10-CM

## 2021-03-03 DIAGNOSIS — D649 Anemia, unspecified: Secondary | ICD-10-CM | POA: Diagnosis present

## 2021-03-03 DIAGNOSIS — E871 Hypo-osmolality and hyponatremia: Secondary | ICD-10-CM

## 2021-03-03 DIAGNOSIS — K31819 Angiodysplasia of stomach and duodenum without bleeding: Secondary | ICD-10-CM

## 2021-03-03 DIAGNOSIS — E876 Hypokalemia: Secondary | ICD-10-CM | POA: Diagnosis present

## 2021-03-03 DIAGNOSIS — R933 Abnormal findings on diagnostic imaging of other parts of digestive tract: Secondary | ICD-10-CM

## 2021-03-03 HISTORY — PX: ESOPHAGEAL DILATION: SHX303

## 2021-03-03 HISTORY — PX: ESOPHAGOGASTRODUODENOSCOPY (EGD) WITH PROPOFOL: SHX5813

## 2021-03-03 LAB — COMPREHENSIVE METABOLIC PANEL
ALT: 10 U/L (ref 0–44)
AST: 31 U/L (ref 15–41)
Albumin: 2.8 g/dL — ABNORMAL LOW (ref 3.5–5.0)
Alkaline Phosphatase: 86 U/L (ref 38–126)
Anion gap: 8 (ref 5–15)
BUN: 11 mg/dL (ref 8–23)
CO2: 21 mmol/L — ABNORMAL LOW (ref 22–32)
Calcium: 8.6 mg/dL — ABNORMAL LOW (ref 8.9–10.3)
Chloride: 104 mmol/L (ref 98–111)
Creatinine, Ser: 1.21 mg/dL — ABNORMAL HIGH (ref 0.44–1.00)
GFR, Estimated: 42 mL/min — ABNORMAL LOW (ref >60)
Glucose, Bld: 126 mg/dL — ABNORMAL HIGH (ref 70–99)
Potassium: 3.4 mmol/L — ABNORMAL LOW (ref 3.5–5.1)
Sodium: 133 mmol/L — ABNORMAL LOW (ref 135–145)
Total Bilirubin: 0.4 mg/dL (ref 0.3–1.2)
Total Protein: 5.5 g/dL — ABNORMAL LOW (ref 6.5–8.1)

## 2021-03-03 LAB — CBC
HCT: 31.1 % — ABNORMAL LOW (ref 36.0–46.0)
Hemoglobin: 9.2 g/dL — ABNORMAL LOW (ref 12.0–15.0)
MCH: 22.7 pg — ABNORMAL LOW (ref 26.0–34.0)
MCHC: 29.6 g/dL — ABNORMAL LOW (ref 30.0–36.0)
MCV: 76.6 fL — ABNORMAL LOW (ref 80.0–100.0)
Platelets: 120 10*3/uL — ABNORMAL LOW (ref 150–400)
RBC: 4.06 MIL/uL (ref 3.87–5.11)
RDW: 19.9 % — ABNORMAL HIGH (ref 11.5–15.5)
WBC: 3.1 10*3/uL — ABNORMAL LOW (ref 4.0–10.5)
nRBC: 0 % (ref 0.0–0.2)

## 2021-03-03 LAB — MAGNESIUM: Magnesium: 1.9 mg/dL (ref 1.7–2.4)

## 2021-03-03 LAB — GLUCOSE, CAPILLARY
Glucose-Capillary: 121 mg/dL — ABNORMAL HIGH (ref 70–99)
Glucose-Capillary: 95 mg/dL (ref 70–99)
Glucose-Capillary: 86 mg/dL (ref 70–99)
Glucose-Capillary: 121 mg/dL — ABNORMAL HIGH (ref 70–99)

## 2021-03-03 LAB — URINE CULTURE

## 2021-03-03 LAB — PHOSPHORUS: Phosphorus: 2.3 mg/dL — ABNORMAL LOW (ref 2.5–4.6)

## 2021-03-03 SURGERY — ESOPHAGOGASTRODUODENOSCOPY (EGD) WITH PROPOFOL
Anesthesia: General

## 2021-03-03 MED ORDER — PROPOFOL 10 MG/ML IV BOLUS
INTRAVENOUS | Status: DC | PRN
  Administered 2021-03-03: 40 mg via INTRAVENOUS

## 2021-03-03 MED ORDER — LIDOCAINE HCL (PF) 2 % IJ SOLN
INTRAMUSCULAR | Status: AC
  Filled 2021-03-03: qty 5

## 2021-03-03 MED ORDER — LACTATED RINGERS IV SOLN: INTRAVENOUS | Status: DC | PRN

## 2021-03-03 MED ORDER — ISOSORBIDE MONONITRATE 10 MG PO TABS
5.0000 mg | ORAL_TABLET | Freq: Two times a day (BID) | ORAL | Status: DC
  Administered 2021-03-03 – 2021-03-04 (×2): 5 mg via ORAL
  Filled 2021-03-03 (×4): qty 0.5

## 2021-03-03 MED ORDER — PHENYLEPHRINE 40 MCG/ML (10ML) SYRINGE FOR IV PUSH (FOR BLOOD PRESSURE SUPPORT)
PREFILLED_SYRINGE | INTRAVENOUS | Status: DC | PRN
  Administered 2021-03-03 (×2): 40 ug via INTRAVENOUS

## 2021-03-03 MED ORDER — POTASSIUM CHLORIDE 20 MEQ PO PACK
40.0000 meq | PACK | Freq: Once | ORAL | Status: AC
  Administered 2021-03-03: 40 meq via ORAL
  Filled 2021-03-03: qty 2

## 2021-03-03 MED ORDER — EPHEDRINE SULFATE-NACL 50-0.9 MG/10ML-% IV SOSY
PREFILLED_SYRINGE | INTRAVENOUS | Status: DC | PRN
  Administered 2021-03-03 (×2): 2.5 mg via INTRAVENOUS

## 2021-03-03 MED ORDER — LIDOCAINE HCL (CARDIAC) PF 100 MG/5ML IV SOSY
PREFILLED_SYRINGE | INTRAVENOUS | Status: DC | PRN
  Administered 2021-03-03 (×3): 10 mg via INTRATRACHEAL
  Administered 2021-03-03: 40 mg via INTRATRACHEAL

## 2021-03-03 MED ORDER — POTASSIUM PHOSPHATES 15 MMOLE/5ML IV SOLN
15.0000 mmol | Freq: Once | INTRAVENOUS | Status: AC
  Administered 2021-03-03: 15 mmol via INTRAVENOUS
  Filled 2021-03-03: qty 5

## 2021-03-03 MED ORDER — PROPOFOL 10 MG/ML IV BOLUS
INTRAVENOUS | Status: AC
  Filled 2021-03-03: qty 20

## 2021-03-03 MED ORDER — POTASSIUM CHLORIDE IN NACL 20-0.9 MEQ/L-% IV SOLN: INTRAVENOUS | Status: DC

## 2021-03-03 NOTE — Op Note (Signed)
[facility] Patient Name: Melissa Bentley Procedure Date: 03/03/2021 8:29 AM MRN: [id number] Date of Birth: 04/15/1927 Attending MD: Hildred Laser , MD CSN: [id number] Age: 88 Admit Type: Inpatient Procedure:                Upper GI endoscopy Indications:              Esophageal dysphagia, Abnormal cine-esophagram Providers:                Hildred Laser, MD, Lurline Del, RN, Hughie Closs RN,                            RN, Casimer Bilis, Technician Referring MD:             Wendee Beavers, MD Medicines:                Propofol per Anesthesia Complications:            No immediate complications. Estimated Blood Loss:     Estimated blood loss: none. Procedure:                Pre-Anesthesia Assessment:                           - Prior to the procedure, a History and Physical                            was performed, and patient medications and                            allergies were reviewed. The patient's tolerance of                            previous anesthesia was also reviewed. The risks                            and benefits of the procedure and the sedation                            options and risks were discussed with the patient.                            All questions were answered, and informed consent                            was obtained. Prior Anticoagulants: The patient has                            taken no previous anticoagulant or antiplatelet                            agents except for aspirin. ASA Grade Assessment:                            III - A patient with severe systemic disease. After  reviewing the risks and benefits, the patient was                            deemed in satisfactory condition to undergo the                            procedure.                           After obtaining informed consent, the endoscope was                            passed under direct vision. Throughout the                             procedure, the patient's blood pressure, pulse, and                            oxygen saturations were monitored continuously. The                            [id number] scope was introduced through the                            mouth, and advanced to the second part of duodenum.                            The upper GI endoscopy was accomplished without                            difficulty. The patient tolerated the procedure                            well. Scope In: 9:16:22 AM Scope Out: 9:26:05 AM Total Procedure Duration: 0 hours 9 minutes 43 seconds  Findings:      The hypopharynx was normal.      Abnormal motility was noted in the esophagus. There is spasticity and       extra peristaltic waves of the esophageal body. The distal       esophagus/lower esophageal sphincter is spastic, but gives up passage to       the endoscope. Normal peristalsis not noted.      The Z-line was regular and was found 28 cm from the incisors.      GEJ dilated with balloon to 15 mm but no mucosal disruption noted.      A 6 cm hiatal hernia was present.      Moderate gastric antral vascular ectasia without bleeding was present in       the gastric antrum and in the prepyloric region of the stomach.      The exam of the stomach was otherwise normal.      The duodenal bulb and second portion of the duodenum were normal. Impression:               - Normal hypopharynx.                           -  Abnormal esophageal motility, suspicious for                            esophageal spasm.                           - Z-line regular, 28 cm from the incisors.                           - 6 cm hiatal hernia.                           - Gastric antral vascular ectasia without bleeding.                           - Normal duodenal bulb and second portion of the                            duodenum.                           - No specimens collected. Moderate Sedation:      Per Anesthesia  Care Recommendation:           - Return patient to hospital ward for ongoing care.                           - Full liquid diet today.                           - Continue present medications.                           - Isosorbide mononitrate 5 mg twice daily. If she                            tolerates this medication will increase dose to 5                            mg before each meal. Procedure Code(s):        --- Professional ---                           [phone], Esophagogastroduodenoscopy, flexible,                            transoral; diagnostic, including collection of                            specimen(s) by brushing or washing, when performed                            (separate procedure) Diagnosis Code(s):        --- Professional ---                           K22.4, Dyskinesia of esophagus  K44.9, Diaphragmatic hernia without obstruction or                            gangrene                           K31.819, Angiodysplasia of stomach and duodenum                            without bleeding                           R13.14, Dysphagia, pharyngoesophageal phase                           R93.3, Abnormal findings on diagnostic imaging of                            other parts of digestive tract CPT copyright 2019 American Medical Association. All rights reserved. The codes documented in this report are preliminary and upon coder review may  be revised to meet current compliance requirements. Hildred Laser, MD Hildred Laser, MD 03/03/2021 9:49:41 AM This report has been signed electronically. Number of Addenda: 0

## 2021-03-03 NOTE — Assessment & Plan Note (Deleted)
Resolved

## 2021-03-03 NOTE — Assessment & Plan Note (Addendum)
Likely starvation ketosis from poor p.o. intake.  Resolved.

## 2021-03-03 NOTE — Assessment & Plan Note (Signed)
See dysphagia. ?

## 2021-03-03 NOTE — Assessment & Plan Note (Addendum)
Continue PT/OT

## 2021-03-03 NOTE — Progress Notes (Signed)
Subjective:  Patient complains of feeling cold.  She denies chest pain shortness of breath nausea or vomiting.  She states she is hungry this morning.  Current Medications:  Current Facility-Administered Medications:    acetaminophen (TYLENOL) 160 MG/5ML solution 325 mg, 325 mg, Oral, Q4H PRN, Elgergawy, Silver Huguenin, MD   atorvastatin (LIPITOR) tablet 40 mg, 40 mg, Oral, q1800, Elgergawy, Silver Huguenin, MD, 40 mg at 03/02/21 1640   dextrose 5 % and 0.9 % NaCl with KCl 20 mEq/L infusion, , Intravenous, Continuous, Gonfa, Taye T, MD, Last Rate: 75 mL/hr at 03/02/21 1500, New Bag at 03/02/21 1500   dextrose 50 % solution 50 mL, 1 ampule, Intravenous, Once, Noemi Chapel, MD   DULoxetine (CYMBALTA) DR capsule 30 mg, 30 mg, Oral, Daily, Elgergawy, Silver Huguenin, MD, 30 mg at 03/02/21 2458   feeding supplement (BOOST / RESOURCE BREEZE) liquid 1 Container, 1 Container, Oral, TID BM, Elgergawy, Silver Huguenin, MD   feeding supplement (ENSURE ENLIVE / ENSURE PLUS) liquid 237 mL, 237 mL, Oral, BID BM, Elgergawy, Silver Huguenin, MD, 237 mL at 03/02/21 0931   levothyroxine (SYNTHROID) tablet 50 mcg, 50 mcg, Oral, Daily, Elgergawy, Silver Huguenin, MD, 50 mcg at 03/03/21 0998   loperamide HCl (IMODIUM) 2 MG/15ML solution 2 mg, 2 mg, Oral, PRN, Wendee Beavers T, MD, 2 mg at 03/02/21 1638   metoprolol tartrate (LOPRESSOR) tablet 12.5 mg, 12.5 mg, Oral, BID, Cyndia Skeeters, Taye T, MD, 12.5 mg at 03/02/21 2228   multivitamin with minerals tablet 1 tablet, 1 tablet, Oral, Daily, Cyndia Skeeters, Taye T, MD, 1 tablet at 03/02/21 1501   ondansetron (ZOFRAN) injection 4 mg, 4 mg, Intravenous, Q8H PRN, Cyndia Skeeters, Taye T, MD, 4 mg at 03/02/21 1312   pantoprazole (PROTONIX) EC tablet 40 mg, 40 mg, Oral, Daily, Elgergawy, Silver Huguenin, MD, 40 mg at 03/02/21 0833   potassium chloride (KLOR-CON) packet 40 mEq, 40 mEq, Oral, Once, Gonfa, Taye T, MD   potassium PHOSPHATE 15 mmol in dextrose 5 % 250 mL infusion, 15 mmol, Intravenous, Once, Gonfa, Taye T, MD   Objective: Blood  pressure (!) 96/56, pulse 92, temperature (!) 97.3 F (36.3 C), resp. rate 16, height 5' (1.524 m), weight 39.1 kg, SpO2 96 %. Very thin Caucasian female who is in no acute distress. She has hearing impairment.  Cardiac exam with regular rhythm distant but normal S1 and S2.  Faint systolic murmur at aortic area. Lungs are clear to auscultation. Abdomen is flat soft and nontender with organomegaly or masses. No LE edema or clubbing noted.  Labs/studies Results:   CBC Latest Ref Rng & Units 03/03/2021 03/02/2021 03/01/2021  WBC 4.0 - 10.5 K/uL 3.1(L) 4.1 5.3  Hemoglobin 12.0 - 15.0 g/dL 9.2(L) 9.9(L) 11.4(L)  Hematocrit 36.0 - 46.0 % 31.1(L) 33.2(L) 38.5  Platelets 150 - 400 K/uL 120(L) 133(L) ACLMP    CMP Latest Ref Rng & Units 03/03/2021 03/02/2021 03/01/2021  Glucose 70 - 99 mg/dL 126(H) 124(H) 93  BUN 8 - 23 mg/dL _0 Creatinine 0.44 - 1.00 mg/dL 1.21(H) 1.38(H) 1.56(H)  Sodium 135 - 145 mmol/L 133(L) 131(L) 128(L)  Potassium 3.5 - 5.1 mmol/L 3.4(L) 2.8(L) 4.1  Chloride 98 - 111 mmol/L 104 101 95(L)  CO2 22 - 32 mmol/L 21(L) 22 17(L)  Calcium 8.9 - 10.3 mg/dL 8.6(L) 8.4(L) 9.3  Total Protein 6.5 - 8.1 g/dL 5.5(L) - 7.8  Total Bilirubin 0.3 - 1.2 mg/dL 0.4 - 0.9  Alkaline Phos 38 - 126 U/L 86 - 125  AST  15 - 41 U/L 31 - 43(H)  ALT 0 - 44 U/L 10 - 13    Hepatic Function Latest Ref Rng & Units 03/03/2021 03/01/2021  Total Protein 6.5 - 8.1 g/dL 5.5(L) 7.8  Albumin 3.5 - 5.0 g/dL 2.8(L) 4.3  AST 15 - 41 U/L 31 43(H)  ALT 0 - 44 U/L 10 13  Alk Phosphatase 38 - 126 U/L 86 125  Total Bilirubin 0.3 - 1.2 mg/dL 0.4 0.9   Serum iron 38, TIBC 365 and saturation 10% Serum ferritin 48 Serum folate 5.8 B12 448.   Assessment:  #1.  Esophageal dysphagia.  Barium study reveals severe esophageal motility disorder.  She may also have superadded Candida given poor esophageal emptying.  She is undergoing diagnostic EGD.  #2.  Abnormal barium study with filling defect in stomach/hiatal hernia  concerning for a mass.  #3.  Anemia.  Studies suggest iron deficiency.  Drop in H&H appears to be due to hydration.  #4.  Low BMI.  Her oral intake has been very poor over the last weeks if not longer.  #5.  Hypokalemia.  Serum potassium is 3.4.  #6.  Acute kidney injury improved with hydration.  Plan:  Proceed with esophagogastroduodenoscopy under monitored anesthesia care.

## 2021-03-03 NOTE — Assessment & Plan Note (Addendum)
-  Continue feeding supplements -Liberate diet

## 2021-03-03 NOTE — Anesthesia Preprocedure Evaluation (Signed)
Anesthesia Evaluation  Patient identified by MRN, date of birth, ID band Patient awake    Reviewed: Allergy & Precautions, NPO status , Patient's Chart, lab work & pertinent test results  Airway Mallampati: II  TM Distance: >3 FB Neck ROM: Full    Dental  (+) Edentulous Upper, Edentulous Lower   Pulmonary former smoker,    Pulmonary exam normal breath sounds clear to auscultation       Cardiovascular hypertension, Pt. on medications and Pt. on home beta blockers + CAD, + Past MI, + Cardiac Stents and + Peripheral Vascular Disease  Normal cardiovascular exam Rhythm:Regular Rate:Normal     Neuro/Psych PSYCHIATRIC DISORDERS Depression CVA    GI/Hepatic GERD  Medicated and Poorly Controlled,  Endo/Other  Hypothyroidism   Renal/GU Renal InsufficiencyRenal disease     Musculoskeletal   Abdominal   Peds  Hematology   Anesthesia Other Findings Severe protein calorie malnutrition   Reproductive/Obstetrics                             Anesthesia Physical Anesthesia Plan  ASA: 4  Anesthesia Plan: General   Post-op Pain Management: Minimal or no pain anticipated   Induction: Intravenous  PONV Risk Score and Plan: TIVA  Airway Management Planned: Nasal Cannula and Natural Airway  Additional Equipment:   Intra-op Plan:   Post-operative Plan: Possible Post-op intubation/ventilation  Informed Consent: I have reviewed the patients History and Physical, chart, labs and discussed the procedure including the risks, benefits and alternatives for the proposed anesthesia with the patient or authorized representative who has indicated his/her understanding and acceptance.    Discussed DNR with patient and Suspend DNR.     Plan Discussed with: Surgeon  Anesthesia Plan Comments:         Anesthesia Quick Evaluation

## 2021-03-03 NOTE — Assessment & Plan Note (Addendum)
Likely hypovolemic from poor p.o. intake and dehydration.  Improved. -Recheck in 1 to 2 weeks

## 2021-03-03 NOTE — Assessment & Plan Note (Addendum)
Presented with dysphagia, poor p.o. intake,  weight loss, weakness, hypoglycemia to 55.  Evidence of starvation ketosis on blood work.  She weighed 46.7 kg in 04/2019.  It looks like she lost about 7.5 kg since then. Esophagram with substantial esophageal dysmotility disorder concerning for presbyesophagus, epiphrenic diverticula or gastric diverticula, hiatal hernia and possible food in esophagus.  EGD concerning for esophageal dysmotility.  See details above. -Pured diet -Continue low-dose nitrate per GI -Continue Protonix -Continue speech therapy -Outpatient follow-up with GI in 2 to 4 weeks -Palliative follow-up at [facility]

## 2021-03-03 NOTE — Assessment & Plan Note (Addendum)
Likely prerenal from poor p.o. intake and dehydration.  Last known Cr 0.67 in 01/2019.  Cr 1.56>> 0.90. Improving. -Check renal function in 1 to 2 weeks

## 2021-03-03 NOTE — Progress Notes (Signed)
PROGRESS NOTE  Melissa Bentley YQM:[id number] DOB: [birth year]   PCP: Richardean Chimera, MD  Patient is from: Home.   DOA: 03/01/2021 LOS: 2  Chief complaints:  Chief Complaint  Patient presents with   Weakness     Brief Narrative / Interim history: [name] with PMH of remote CAD/stent, HTN, HLD, hypothyroidism, GERD and remote subdural hematoma s/p evacuation brought to ED due to poor p.o. intake, weight loss, lethargy and dysphagia, and admitted for failure to thrive, dysphagia, AKI, and hypoglycemia.  She also had a fall at home on 02/21/2021.   In ED, bradycardic to 40s and 50s.  Na 128. Cr 1.56.  BUN 21.  Bicarb 17.  AG 16.  AST 43. Hgb 11.4.  MCV 77.3.  Platelets clumped.  CBG 55.  TSH 0.36.  UA negative.  CXR and CTA head and neck without acute finding.  Patient was started on D10.  GI and palliative medicine consulted.   Esophagram with substantial esophageal dysmotility disorder concerning for presbyesophagus, epiphrenic diverticula or gastric diverticula, hiatal hernia and possible food in esophagus.    EGD on 2/4 with abnormal esophageal motility suspicious for esophageal spasm, 6 cm hiatal hernia, gastric antral vascular ectasia without bleeding.  GI recommended low-dose nitrate, full liquid diet and advancing to soft pured diet.     Subjective: Seen and examined earlier this morning before she went down for EGD.  No major events overnight of this morning.  No complaints other than dry mouth from n.p.o.  She denies pain, shortness of breath, nausea or vomiting.  Patient's son at bedside.  Objective: Vitals:   03/03/21 0945 03/03/21 1000 03/03/21 1015 03/03/21 1040  BP: (!) 113/54 135/61 (!) 115/56 127/67  Pulse:  (!) 58 (!) 57 (!) 56  Resp: 14 15 15 18   Temp:      TempSrc:      SpO2: 100% 100% 99% 100%  Weight:      Height:        Examination:  GENERAL: Frail looking elderly female.  No apparent distress. HEENT: MMM.  Vision and hearing grossly intact.   NECK: Supple.  No apparent JVD.  RESP: 100% on RA.  No IWOB.  Fair aeration bilaterally. CVS:  RRR. Heart sounds normal.  ABD/GI/GU: BS+. Abd soft, NTND.  MSK/EXT:  Moves extremities.  Significant muscle mass and subcu fat loss. SKIN: no apparent skin lesion or wound NEURO: Awake, alert and oriented fairly.  No apparent focal neuro deficit. PSYCH: Calm. Normal affect.   Procedures:  EGD on 2/4 with abnormal esophageal motility suspicious for esophageal spasm, 6 cm hiatal hernia, gastric antral vascular ectasia without bleeding.  GI recommended low-dose nitrate, full liquid diet and advancing to soft pured diet.  Microbiology summarized: Urine culture with multiple species.  Assessment and Plan: * Failure to thrive due to dysphagia- (present on admission) Presented with poor p.o. intake, unintentional weight loss and physical deconditioning.  Hypoglycemic to 55 in ED. Some evidence of starvation ketosis on blood work.  Her latest weight before this admission was in 04/2019 when she weighed 46.7 kg.  She lost about 7.5 kg since then. Esophagram with substantial esophageal dysmotility disorder concerning for presbyesophagus, epiphrenic diverticula or gastric diverticula, hiatal hernia and possible food in esophagus.  EGD concerning for esophageal dysmotility.  See details above. -Appreciate GI recs -Full liquid diet>>> pured diet -Low-dose nitrate -Continue PPI -Appreciate input by dietitian -Goal of care discussion per palliative medicine.    AKI (acute kidney injury) (  HCC)- (present on admission) Likely prerenal from poor p.o. intake and dehydration.  Last known Cr 0.67 in 01/2019.  Improving. -Continue IV fluid.    Hypoglycemia Resolved. -Remove D5 from IV fluid  Physical deconditioning- (present on admission) PT/OT  Esophageal dysmotility- (present on admission) See dysphagia  Goals of care, counseling/discussion DNR/DNI appropriate.  Pancytopenia (HCC)- (present on  admission) Likely from malnutrition and hemodilution.  Anemia panel suggests some degree of iron deficiency. -IV iron -Monitor  Hypophosphatemia IV potassium phosphate 15 mmol x 1  Hypokalemia- (present on admission) K3.4. -P.o. KCl 40x1 -IV potassium phosphate 15 mmol x 1 -Recheck in the morning.  Hyponatremia Likely hypovolemic from poor p.o. intake and dehydration.  Improving. -Continue IV fluid  High anion gap metabolic acidosis- (present on admission) Likely starvation ketosis.  Resolved.  Protein-calorie malnutrition, severe (HCC)- (present on admission) See above   Body mass index is 16.83 kg/m. Nutrition Problem: Severe Malnutrition Etiology: acute illness (swallow problem) Signs/Symptoms: energy intake < or equal to 50% for > or equal to 5 days, moderate muscle depletion, moderate fat depletion, mild fat depletion, severe muscle depletion    DVT prophylaxis:  SCDs Start: 03/01/21 2051  Code Status: DNR/DNI Family Communication: Updated patient's son at bedside. Level of care: Med-Surg Status is: Inpatient Remains inpatient appropriate because: Failure to thrive, AKI, electrolyte abnormalities and safe disposition    Final disposition: Likely SNF       Consultants:  Gastroenterology Palliative medicine   Sch Meds:  Scheduled Meds:  atorvastatin  40 mg Oral q1800   dextrose  1 ampule Intravenous Once   DULoxetine  30 mg Oral Daily   feeding supplement  1 Container Oral TID BM   feeding supplement  237 mL Oral BID BM   isosorbide mononitrate  5 mg Oral BID AC   levothyroxine  50 mcg Oral Daily   metoprolol tartrate  12.5 mg Oral BID   multivitamin with minerals  1 tablet Oral Daily   pantoprazole  40 mg Oral Daily   Continuous Infusions:  0.9 % NaCl with KCl 20 mEq / L     potassium PHOSPHATE IVPB (in mmol) 15 mmol (03/03/21 1120)   PRN Meds:.acetaminophen (TYLENOL) oral liquid 160 mg/5 mL, loperamide HCl, ondansetron (ZOFRAN)  IV  Antimicrobials: Anti-infectives (From admission, onward)    None        I have personally reviewed the following labs and images: CBC: Recent Labs  Lab 03/01/21 1550 03/02/21 0430 03/03/21 0401  WBC 5.3 4.1 3.1*  NEUTROABS 3.6  --   --   HGB 11.4* 9.9* 9.2*  HCT 38.5 33.2* 31.1*  MCV 77.3* 75.3* 76.6*  PLT ACLMP 133* 120*   BMP &GFR Recent Labs  Lab 03/01/21 1550 03/02/21 0430 03/03/21 0401  NA 128* 131* 133*  K 4.1 2.8* 3.4*  CL 95* 101 104  CO2 17* 22 21*  GLUCOSE 93 124* 126*  BUN 21 17 11   CREATININE 1.56* 1.38* 1.21*  CALCIUM 9.3 8.4* 8.6*  MG 2.3 1.9 1.9  PHOS  --   --  2.3*   Estimated Creatinine Clearance: 18.7 mL/min (A) (by C-G formula based on SCr of 1.21 mg/dL (H)). Liver & Pancreas: Recent Labs  Lab 03/01/21 1550 03/03/21 0401  AST 43* 31  ALT 13 10  ALKPHOS 125 86  BILITOT 0.9 0.4  PROT 7.8 5.5*  ALBUMIN 4.3 2.8*   No results for input(s): LIPASE, AMYLASE in the last 168 hours. No results for input(s): AMMONIA in  the last 168 hours. Diabetic: No results for input(s): HGBA1C in the last 72 hours. Recent Labs  Lab 03/02/21 1651 03/02/21 2003 03/03/21 0710 03/03/21 0912 03/03/21 1103  GLUCAP 140* 171* 121* 95 86   Cardiac Enzymes: Recent Labs  Lab 03/02/21 1303  CKTOTAL 57   No results for input(s): PROBNP in the last 8760 hours. Coagulation Profile: No results for input(s): INR, PROTIME in the last 168 hours. Thyroid Function Tests: Recent Labs    03/01/21 2147 03/02/21 0430  TSH 0.237*  --   FREET4  --  1.25*   Lipid Profile: No results for input(s): CHOL, HDL, LDLCALC, TRIG, CHOLHDL, LDLDIRECT in the last 72 hours. Anemia Panel: Recent Labs    03/02/21 1241  VITAMINB12 448  FOLATE 5.8*  FERRITIN 48  TIBC 365  IRON 38  RETICCTPCT 0.5   Urine analysis:    Component Value Date/Time   COLORURINE YELLOW 03/01/2021 1757   APPEARANCEUR CLEAR 03/01/2021 1757   LABSPEC 1.015 03/01/2021 1757   PHURINE 6.0  03/01/2021 1757   GLUCOSEU NEGATIVE 03/01/2021 1757   HGBUR NEGATIVE 03/01/2021 1757   BILIRUBINUR NEGATIVE 03/01/2021 1757   KETONESUR NEGATIVE 03/01/2021 1757   PROTEINUR NEGATIVE 03/01/2021 1757   NITRITE NEGATIVE 03/01/2021 1757   LEUKOCYTESUR NEGATIVE 03/01/2021 1757   Sepsis Labs: Invalid input(s): PROCALCITONIN, LACTICIDVEN  Microbiology: Recent Results (from the past 240 hour(s))  Urine Culture     Status: Abnormal   Collection Time: 03/01/21  5:56 PM   Specimen: Urine, Catheterized  Result Value Ref Range Status   Specimen Description   Final    URINE, CATHETERIZED Performed at [facility], [address] [place], [place] [address]    Special Requests   Final    NONE Performed at [facility], [address] [place], [place] [address]    Culture MULTIPLE SPECIES PRESENT, SUGGEST RECOLLECTION (A)  Final   Report Status 03/03/2021 FINAL  Final    Radiology Studies: No results found.    Mekhai Venuto T. Anderia Lorenzo [facility]  If 7PM-7AM, please contact night-coverage [email or web address] 03/03/2021, 12:37 PM

## 2021-03-03 NOTE — Assessment & Plan Note (Deleted)
Esophagram with substantial esophageal dysmotility disorder concerning for presbyesophagus, epiphrenic diverticula or gastric diverticula, hiatal hernia and possible food in esophagus.  EGD concerning for esophageal dysmotility.  See details above. -Appreciate GI recs -Full liquid diet>>> pured diet -Low-dose nitrate -Continue PPI

## 2021-03-03 NOTE — Assessment & Plan Note (Addendum)
Likely from malnutrition and hemodilution.  Anemia panel suggests some degree of iron deficiency.  Received IV iron.  Improved. -Check CBC 1 to 2 weeks

## 2021-03-03 NOTE — Transfer of Care (Signed)
Immediate Anesthesia Transfer of Care Note  Patient: Melissa Bentley  Procedure(s) Performed: ESOPHAGOGASTRODUODENOSCOPY (EGD) WITH PROPOFOL Balloon dilation wire-guided  Patient Location: PACU  Anesthesia Type:General  Level of Consciousness: awake, sedated, drowsy and patient cooperative  Airway & Oxygen Therapy: Patient Spontanous Breathing and Patient connected to nasal cannula oxygen  Post-op Assessment: Report given to RN and Post -op Vital signs reviewed and stable  Post vital signs: Reviewed and stable  Last Vitals:  Vitals Value Taken Time  BP 104/48 03/03/21 0935  Temp 97.4 935  Pulse 56 935  Resp 15 03/03/21 0938  SpO2 97 935  Vitals shown include unvalidated device data.  Last Pain:  Vitals:   03/03/21 0900  TempSrc: Axillary  PainSc: 0-No pain         Complications: No notable events documented.

## 2021-03-03 NOTE — Assessment & Plan Note (Addendum)
Resolved

## 2021-03-03 NOTE — Assessment & Plan Note (Addendum)
DNR/DNI appropriate. Palliative follow-up at SNF.

## 2021-03-03 NOTE — Progress Notes (Signed)
Brief EGD note.  Normal hypopharynx. Abnormal esophageal motility with tight GE junction. Normal esophageal mucosa. GE junction dilated with balloon dilator to 12 mm.  No mucosal disruption noted. 6 cm size hiatal hernia. Antral/prepyloric telangiectasia without bleeding. Normal bulbar and postbulbar mucosa.  Patient tolerated the procedure well.

## 2021-03-03 NOTE — Anesthesia Postprocedure Evaluation (Signed)
Anesthesia Post Note  Patient: Melissa Bentley  Procedure(s) Performed: ESOPHAGOGASTRODUODENOSCOPY (EGD) WITH PROPOFOL Balloon dilation wire-guided  Patient location during evaluation: PACU Anesthesia Type: General Level of consciousness: awake and alert and oriented Pain management: pain level controlled Vital Signs Assessment: post-procedure vital signs reviewed and stable Respiratory status: spontaneous breathing, nonlabored ventilation, respiratory function stable and patient connected to nasal cannula oxygen Cardiovascular status: blood pressure returned to baseline and stable Postop Assessment: no apparent nausea or vomiting Anesthetic complications: no   No notable events documented.   Last Vitals:  Vitals:   03/03/21 1015 03/03/21 1040  BP: (!) 115/56 127/67  Pulse: (!) 57 (!) 56  Resp: 15 18  Temp:    SpO2: 99% 100%    Last Pain:  Vitals:   03/03/21 1015  TempSrc:   PainSc: 0-No pain                 Latorsha Curling C Genesee Nase

## 2021-03-03 NOTE — Hospital Course (Addendum)
[name] with PMH of remote CAD/stent, HTN, HLD, hypothyroidism, GERD and remote subdural hematoma s/p evacuation brought to ED due to poor p.o. intake, weight loss, lethargy and dysphagia, and admitted for failure to thrive, dysphagia, AKI, and hypoglycemia.  She also had a fall at home on 02/21/2021.   In ED, bradycardic to 40s and 50s.  Na 128. Cr 1.56.  BUN 21.  Bicarb 17.  AG 16.  AST 43. Hgb 11.4.  MCV 77.3.  Platelets clumped.  CBG 55.  TSH 0.36.  UA negative.  CXR and CTA head and neck without acute finding.  Patient was started on D10.  GI and palliative medicine consulted.   Esophagram with substantial esophageal dysmotility disorder concerning for presbyesophagus, epiphrenic diverticula or gastric diverticula, hiatal hernia and possible food in esophagus.    EGD on 2/4 with abnormal esophageal motility suspicious for esophageal spasm, 6 cm hiatal hernia, gastric antral vascular ectasia without bleeding.  GI started low-dose nitrate for esophageal dysmotility and recommended pured diet.  She will remain on PPI.  Recommend speech follow-up at SNF.  Outpatient follow-up with GI in 2 to 4 weeks.  Patient's AKI, acidosis, hypoglycemia and electrolyte derangement resolved.  Bradycardia improved.   Therapy recommended SNF.

## 2021-03-04 DIAGNOSIS — E878 Other disorders of electrolyte and fluid balance, not elsewhere classified: Secondary | ICD-10-CM

## 2021-03-04 LAB — RENAL FUNCTION PANEL
Albumin: 2.6 g/dL — ABNORMAL LOW (ref 3.5–5.0)
Anion gap: 5 (ref 5–15)
BUN: 8 mg/dL (ref 8–23)
CO2: 20 mmol/L — ABNORMAL LOW (ref 22–32)
Calcium: 8.4 mg/dL — ABNORMAL LOW (ref 8.9–10.3)
Chloride: 109 mmol/L (ref 98–111)
Creatinine, Ser: 0.97 mg/dL (ref 0.44–1.00)
GFR, Estimated: 55 mL/min — ABNORMAL LOW (ref >60)
Glucose, Bld: 75 mg/dL (ref 70–99)
Phosphorus: 2.8 mg/dL (ref 2.5–4.6)
Potassium: 4 mmol/L (ref 3.5–5.1)
Sodium: 134 mmol/L — ABNORMAL LOW (ref 135–145)

## 2021-03-04 LAB — CORTISOL-AM, BLOOD: Cortisol - AM: 11.7 ug/dL (ref 6.7–22.6)

## 2021-03-04 LAB — CBC
HCT: 29.2 % — ABNORMAL LOW (ref 36.0–46.0)
Hemoglobin: 9 g/dL — ABNORMAL LOW (ref 12.0–15.0)
MCH: 23.6 pg — ABNORMAL LOW (ref 26.0–34.0)
MCHC: 30.8 g/dL (ref 30.0–36.0)
MCV: 76.4 fL — ABNORMAL LOW (ref 80.0–100.0)
Platelets: 114 10*3/uL — ABNORMAL LOW (ref 150–400)
RBC: 3.82 MIL/uL — ABNORMAL LOW (ref 3.87–5.11)
RDW: 20.3 % — ABNORMAL HIGH (ref 11.5–15.5)
WBC: 2.8 10*3/uL — ABNORMAL LOW (ref 4.0–10.5)
nRBC: 0 % (ref 0.0–0.2)

## 2021-03-04 LAB — MAGNESIUM: Magnesium: 1.7 mg/dL (ref 1.7–2.4)

## 2021-03-04 MED ORDER — SODIUM CHLORIDE 0.9% FLUSH: 3.0000 mL | INTRAVENOUS | Status: DC | PRN

## 2021-03-04 MED ORDER — ISOSORBIDE MONONITRATE 10 MG PO TABS
5.0000 mg | ORAL_TABLET | Freq: Three times a day (TID) | ORAL | Status: DC
  Administered 2021-03-05 (×2): 5 mg via ORAL
  Filled 2021-03-04 (×8): qty 0.5

## 2021-03-04 MED ORDER — MIDODRINE HCL 5 MG PO TABS
2.5000 mg | ORAL_TABLET | Freq: Three times a day (TID) | ORAL | Status: DC
  Administered 2021-03-04 – 2021-03-05 (×4): 2.5 mg via ORAL
  Filled 2021-03-04 (×4): qty 1

## 2021-03-04 MED ORDER — SODIUM CHLORIDE 0.9 % IV SOLN: 250.0000 mL | INTRAVENOUS | Status: DC | PRN

## 2021-03-04 MED ORDER — SODIUM CHLORIDE 0.9% FLUSH
3.0000 mL | Freq: Two times a day (BID) | INTRAVENOUS | Status: DC
  Administered 2021-03-04 – 2021-03-05 (×3): 3 mL via INTRAVENOUS

## 2021-03-04 MED ORDER — MAGNESIUM SULFATE 2 GM/50ML IV SOLN
2.0000 g | Freq: Once | INTRAVENOUS | Status: AC
  Administered 2021-03-04: 2 g via INTRAVENOUS
  Filled 2021-03-04: qty 50

## 2021-03-04 NOTE — Progress Notes (Signed)
BP this morning 105/59  pulse 65.  Dr. Cyndia Skeeters ordered to go ahead and give metoprolol 12.5 and isosorbide 5 mg and he ordered midodrine 2.5 mg 3 times daily , as well.  Orthostatic vitals were taken when getting up to bedside commode and she did not get hypotensive but said she did get a little swimmy-headed.  Sacral dressing placed

## 2021-03-04 NOTE — Progress Notes (Signed)
Subjective:  Patient reports she is able to swallow better.  According to nursing staff she did not eat much because she said she did not have an appetite.  Patient has been upset with her son [name].  She indicated to me earlier today that he is of no help. She has not experienced she has not complained of postural lightheadedness or headache to the nursing staff.  Current Medications:  Current Facility-Administered Medications:    0.9 %  sodium chloride infusion, 250 mL, Intravenous, PRN, Cyndia Skeeters, Taye T, MD   acetaminophen (TYLENOL) 160 MG/5ML solution 325 mg, 325 mg, Oral, Q4H PRN, Elgergawy, Silver Huguenin, MD   atorvastatin (LIPITOR) tablet 40 mg, 40 mg, Oral, q1800, Elgergawy, Silver Huguenin, MD, 40 mg at 03/03/21 1654   DULoxetine (CYMBALTA) DR capsule 30 mg, 30 mg, Oral, Daily, Elgergawy, Silver Huguenin, MD, 30 mg at 03/04/21 1038   feeding supplement (BOOST / RESOURCE BREEZE) liquid 1 Container, 1 Container, Oral, TID BM, Elgergawy, Silver Huguenin, MD, 1 Container at 03/04/21 1238   feeding supplement (ENSURE ENLIVE / ENSURE PLUS) liquid 237 mL, 237 mL, Oral, BID BM, Elgergawy, Silver Huguenin, MD, 237 mL at 03/04/21 1239   isosorbide mononitrate (ISMO) tablet 5 mg, 5 mg, Oral, BID AC, Erma Joubert U, MD, 5 mg at 03/04/21 1049   levothyroxine (SYNTHROID) tablet 50 mcg, 50 mcg, Oral, Daily, Elgergawy, Silver Huguenin, MD, 50 mcg at 03/04/21 0518   loperamide HCl (IMODIUM) 2 MG/15ML solution 2 mg, 2 mg, Oral, PRN, Wendee Beavers T, MD, 2 mg at 03/02/21 1638   metoprolol tartrate (LOPRESSOR) tablet 12.5 mg, 12.5 mg, Oral, BID, Cyndia Skeeters, Taye T, MD, 12.5 mg at 03/04/21 1048   midodrine (PROAMATINE) tablet 2.5 mg, 2.5 mg, Oral, TID WC, Cyndia Skeeters, Taye T, MD, 2.5 mg at 03/04/21 1239   multivitamin with minerals tablet 1 tablet, 1 tablet, Oral, Daily, Cyndia Skeeters, Taye T, MD, 1 tablet at 03/04/21 1038   ondansetron (ZOFRAN) injection 4 mg, 4 mg, Intravenous, Q8H PRN, Cyndia Skeeters, Taye T, MD, 4 mg at 03/02/21 1312   pantoprazole (PROTONIX) EC tablet  40 mg, 40 mg, Oral, Daily, Elgergawy, Silver Huguenin, MD, 40 mg at 03/04/21 1038   sodium chloride flush (NS) 0.9 % injection 3 mL, 3 mL, Intravenous, Q12H, Gonfa, Taye T, MD   sodium chloride flush (NS) 0.9 % injection 3 mL, 3 mL, Intravenous, PRN, Cyndia Skeeters, Taye T, MD  Objective: Blood pressure 110/69, pulse (!) 57, temperature 97.6 F (36.4 C), temperature source Axillary, resp. rate 18, height 5' (1.524 m), weight 39.1 kg, SpO2 100 %.  Supine pulse 65/min and blood pressure  Sitting pulse 68/min and blood pressure 114/69  Standing pulse 73/min and blood pressure 109/63   Patient is alert and in no acute distress. I observed her while she was drinking Ensure Plus. She was able to drink it without any difficulty. No coughing or regurgitation noted.  Labs/studies Results:   CBC Latest Ref Rng & Units 03/04/2021 03/03/2021 03/02/2021  WBC 4.0 - 10.5 K/uL 2.8(L) 3.1(L) 4.1  Hemoglobin 12.0 - 15.0 g/dL 9.0(L) 9.2(L) 9.9(L)  Hematocrit 36.0 - 46.0 % 29.2(L) 31.1(L) 33.2(L)  Platelets 150 - 400 K/uL 114(L) 120(L) 133(L)    CMP Latest Ref Rng & Units 03/04/2021 03/03/2021 03/02/2021  Glucose 70 - 99 mg/dL 75 126(H) 124(H)  BUN 8 - 23 mg/dL 8 11 17   Creatinine 0.44 - 1.00 mg/dL 0.97 1.21(H) 1.38(H)  Sodium 135 - 145 mmol/L 134(L) 133(L) 131(L)  Potassium 3.5 - 5.1 mmol/L  4.0 3.4(L) 2.8(L)  Chloride 98 - 111 mmol/L 109 104 101  CO2 22 - 32 mmol/L 20(L) 21(L) 22  Calcium 8.9 - 10.3 mg/dL 8.4(L) 8.6(L) 8.4(L)  Total Protein 6.5 - 8.1 g/dL - 5.5(L) -  Total Bilirubin 0.3 - 1.2 mg/dL - 0.4 -  Alkaline Phos 38 - 126 U/L - 86 -  AST 15 - 41 U/L - 31 -  ALT 0 - 44 U/L - 10 -    Hepatic Function Latest Ref Rng & Units 03/04/2021 03/03/2021 03/01/2021  Total Protein 6.5 - 8.1 g/dL - 5.5(L) 7.8  Albumin 3.5 - 5.0 g/dL 2.6(L) 2.8(L) 4.3  AST 15 - 41 U/L - 31 43(H)  ALT 0 - 44 U/L - 10 13  Alk Phosphatase 38 - 126 U/L - 86 125  Total Bilirubin 0.3 - 1.2 mg/dL - 0.4 0.9     Assessment:  #1.  Esophageal  dysphagia secondary to severe esophageal motility disorder I documented on esophagogram and EGD.  GE junction was dilated with balloon to 15 mm without causing mucosal disruption.  Patient was begun on low-dose isosorbide mononitrate which she is tolerating well.  She is not orthostatic since she been on this medication.  #2.  Low BMI and malnutrition secondary to dysphagia.    Recommendations  Increase isosorbide mono try dose to 5 mg by mouth 30 minutes before each meal.

## 2021-03-04 NOTE — Progress Notes (Signed)
Was assisted up to bedside commode today with minimal assist while getting bath. Swallowing pills and drinking with no difficulty.  Poor oral intake.

## 2021-03-04 NOTE — Progress Notes (Signed)
PROGRESS NOTE  Melissa Bentley CXK:[id number] DOB: 1927-11-27   PCP: Richardean Chimera, MD  Patient is from: Home.   DOA: 03/01/2021 LOS: 3  Chief complaints:  Chief Complaint  Patient presents with   Weakness     Brief Narrative / Interim history: [name] with PMH of remote CAD/stent, HTN, HLD, hypothyroidism, GERD and remote subdural hematoma s/p evacuation brought to ED due to poor p.o. intake, weight loss, lethargy and dysphagia, and admitted for failure to thrive, dysphagia, AKI, and hypoglycemia.  She also had a fall at home on 02/21/2021.   In ED, bradycardic to 40s and 50s.  Na 128. Cr 1.56.  BUN 21.  Bicarb 17.  AG 16.  AST 43. Hgb 11.4.  MCV 77.3.  Platelets clumped.  CBG 55.  TSH 0.36.  UA negative.  CXR and CTA head and neck without acute finding.  Patient was started on D10.  GI and palliative medicine consulted.   Esophagram with substantial esophageal dysmotility disorder concerning for presbyesophagus, epiphrenic diverticula or gastric diverticula, hiatal hernia and possible food in esophagus.    EGD on 2/4 with abnormal esophageal motility suspicious for esophageal spasm, 6 cm hiatal hernia, gastric antral vascular ectasia without bleeding.  GI recommended low-dose nitrate, full liquid diet and advancing to soft pured diet.     Subjective: Seen and examined earlier this morning.  No major events overnight of this morning.  No complaints but somewhat apprehensive and crying stating that family is not visiting.  Does not appear to be in distress.  Objective: Vitals:   03/04/21 0532 03/04/21 1033 03/04/21 1040 03/04/21 1045  BP: (!) 103/55 (!) 105/59 114/69 109/63  Pulse: 60 65 68 73  Resp:  16    Temp: 98.9 F (37.2 C) 97.7 F (36.5 C)    TempSrc:  Oral    SpO2: 98% 98%    Weight:      Height:        Examination:  GENERAL: Frail looking elderly female.  Nontoxic. HEENT: MMM.  Vision and hearing grossly intact.  NECK: Supple.  No apparent JVD.   RESP:  No IWOB.  Fair aeration bilaterally. CVS:  RRR. Heart sounds normal.  ABD/GI/GU: BS+. Abd soft, NTND.  MSK/EXT:  Moves extremities.  Significant muscle mass and subcu fat loss. SKIN: no apparent skin lesion or wound NEURO: Awake and alert. Oriented fairly.  No apparent focal neuro deficit. PSYCH: Somewhat anxious and apprehensive.  Procedures:  EGD on 2/4 with abnormal esophageal motility suspicious for esophageal spasm, 6 cm hiatal hernia, gastric antral vascular ectasia without bleeding.  GI recommended low-dose nitrate, full liquid diet and advancing to soft pured diet.  Microbiology summarized: Urine culture with multiple species.  Assessment and Plan: * Failure to thrive due to dysphagia- (present on admission) Presented with dysphagia, poor p.o. intake,  weight loss, weakness, hypoglycemia to 55.  Evidence of starvation ketosis on blood work.  She weighed 46.7 kg in 04/2019.  It looks like she lost about 7.5 kg since then. Esophagram with substantial esophageal dysmotility disorder concerning for presbyesophagus, epiphrenic diverticula or gastric diverticula, hiatal hernia and possible food in esophagus.  EGD concerning for esophageal dysmotility.  See details above. -Appreciate GI recs -Full liquid diet>>> pured diet if she is willing -On low-dose nitrate per GI -Continue PPI -Appreciate input by dietitian -Goal of care discussion per palliative medicine.    AKI (acute kidney injury) (HCC)- (present on admission) Likely prerenal from poor p.o. intake and dehydration.  Last known Cr 0.67 in 01/2019.  Cr 1.56>> 0.97. Improving. -Continue IV fluid.  Hypoglycemia Resolved. -Remove D5 from IV fluid  Physical deconditioning- (present on admission) PT/OT  Esophageal dysmotility- (present on admission) See dysphagia  Goals of care, counseling/discussion DNR/DNI appropriate. Need palliative follow-up at SNF.  Pancytopenia (HCC)- (present on admission) Likely from  malnutrition and hemodilution.  Anemia panel suggests some degree of iron deficiency. -IV iron -Monitor  Hypokalemia, hypomagnesemia and hypophosphatemia- (present on admission) At risk for refeeding syndrome. -Monitor and replace as appropriate -IV magnesium sulfate 2 g x 1 today  Hyponatremia Likely hypovolemic from poor p.o. intake and dehydration.  Improving with IVF. -Continue IV fluid  High anion gap metabolic acidosis- (present on admission) Likely starvation ketosis.  Improved.  Protein-calorie malnutrition, severe (HCC)- (present on admission) See above  Hypophosphatemia-resolved as of 03/04/2021 Resolved.   Body mass index is 16.83 kg/m. Nutrition Problem: Severe Malnutrition Etiology: acute illness (swallow problem) Signs/Symptoms: energy intake < or equal to 50% for > or equal to 5 days, moderate muscle depletion, moderate fat depletion, mild fat depletion, severe muscle depletion    DVT prophylaxis:  SCDs Start: 03/01/21 2051  Code Status: DNR/DNI Family Communication: Updated patient's son at bedside on 2/4.  None at bedside today. Level of care: Med-Surg Status is: Inpatient Remains inpatient appropriate because: Failure to thrive, electrolyte abnormalities and safe disposition    Final disposition: Likely SNF       Consultants:  Gastroenterology Palliative medicine   Sch Meds:  Scheduled Meds:  atorvastatin  40 mg Oral q1800   DULoxetine  30 mg Oral Daily   feeding supplement  1 Container Oral TID BM   feeding supplement  237 mL Oral BID BM   isosorbide mononitrate  5 mg Oral BID AC   levothyroxine  50 mcg Oral Daily   metoprolol tartrate  12.5 mg Oral BID   midodrine  2.5 mg Oral TID WC   multivitamin with minerals  1 tablet Oral Daily   pantoprazole  40 mg Oral Daily   Continuous Infusions:  0.9 % NaCl with KCl 20 mEq / L 75 mL/hr at 03/03/21 1315   PRN Meds:.acetaminophen (TYLENOL) oral liquid 160 mg/5 mL, loperamide HCl,  ondansetron (ZOFRAN) IV  Antimicrobials: Anti-infectives (From admission, onward)    None        I have personally reviewed the following labs and images: CBC: Recent Labs  Lab 03/01/21 1550 03/02/21 0430 03/03/21 0401 03/04/21 0436  WBC 5.3 4.1 3.1* 2.8*  NEUTROABS 3.6  --   --   --   HGB 11.4* 9.9* 9.2* 9.0*  HCT 38.5 33.2* 31.1* 29.2*  MCV 77.3* 75.3* 76.6* 76.4*  PLT ACLMP 133* 120* 114*   BMP &GFR Recent Labs  Lab 03/01/21 1550 03/02/21 0430 03/03/21 0401 03/04/21 0436  NA 128* 131* 133* 134*  K 4.1 2.8* 3.4* 4.0  CL 95* 101 104 109  CO2 17* 22 21* 20*  GLUCOSE 93 124* 126* 75  BUN 21 17 11 8   CREATININE 1.56* 1.38* 1.21* 0.97  CALCIUM 9.3 8.4* 8.6* 8.4*  MG 2.3 1.9 1.9 1.7  PHOS  --   --  2.3* 2.8   Estimated Creatinine Clearance: 23.3 mL/min (by C-G formula based on SCr of 0.97 mg/dL). Liver & Pancreas: Recent Labs  Lab 03/01/21 1550 03/03/21 0401 03/04/21 0436  AST 43* 31  --   ALT 13 10  --   ALKPHOS 125 86  --   BILITOT 0.9  0.4  --   PROT 7.8 5.5*  --   ALBUMIN 4.3 2.8* 2.6*   No results for input(s): LIPASE, AMYLASE in the last 168 hours. No results for input(s): AMMONIA in the last 168 hours. Diabetic: No results for input(s): HGBA1C in the last 72 hours. Recent Labs  Lab 03/02/21 2003 03/03/21 0710 03/03/21 0912 03/03/21 1103 03/03/21 1632  GLUCAP 171* 121* 95 86 121*   Cardiac Enzymes: Recent Labs  Lab 03/02/21 1303  CKTOTAL 57   No results for input(s): PROBNP in the last 8760 hours. Coagulation Profile: No results for input(s): INR, PROTIME in the last 168 hours. Thyroid Function Tests: Recent Labs    03/01/21 2147 03/02/21 0430  TSH 0.237*  --   FREET4  --  1.25*   Lipid Profile: No results for input(s): CHOL, HDL, LDLCALC, TRIG, CHOLHDL, LDLDIRECT in the last 72 hours. Anemia Panel: Recent Labs    03/02/21 1241  VITAMINB12 448  FOLATE 5.8*  FERRITIN 48  TIBC 365  IRON 38  RETICCTPCT 0.5   Urine  analysis:    Component Value Date/Time   COLORURINE YELLOW 03/01/2021 1757   APPEARANCEUR CLEAR 03/01/2021 1757   LABSPEC 1.015 03/01/2021 1757   PHURINE 6.0 03/01/2021 1757   GLUCOSEU NEGATIVE 03/01/2021 1757   HGBUR NEGATIVE 03/01/2021 1757   BILIRUBINUR NEGATIVE 03/01/2021 1757   KETONESUR NEGATIVE 03/01/2021 1757   PROTEINUR NEGATIVE 03/01/2021 1757   NITRITE NEGATIVE 03/01/2021 1757   LEUKOCYTESUR NEGATIVE 03/01/2021 1757   Sepsis Labs: Invalid input(s): PROCALCITONIN, LACTICIDVEN  Microbiology: Recent Results (from the past 240 hour(s))  Urine Culture     Status: Abnormal   Collection Time: 03/01/21  5:56 PM   Specimen: Urine, Catheterized  Result Value Ref Range Status   Specimen Description   Final    URINE, CATHETERIZED Performed at [facility], [address], [place], [place] [address]    Special Requests   Final    NONE Performed at [facility], [address], [place], [place] [address]    Culture MULTIPLE SPECIES PRESENT, SUGGEST RECOLLECTION (A)  Final   Report Status 03/03/2021 FINAL  Final    Radiology Studies: No results found.    Kaci Freel T. Onesty Clair [facility]  If 7PM-7AM, please contact night-coverage [email or web address] 03/04/2021, 12:20 PM

## 2021-03-05 ENCOUNTER — Inpatient Hospital Stay
Admission: RE | Admit: 2021-03-05 | Discharge: 2021-04-20 | Disposition: A | Discharge status: Nursing Home (Includes ICF) | Payer: Medicare Other | Source: Ambulatory Visit | Attending: Internal Medicine | Admitting: Internal Medicine

## 2021-03-05 ENCOUNTER — Encounter (HOSPITAL_COMMUNITY): Payer: Self-pay | Admitting: Internal Medicine

## 2021-03-05 DIAGNOSIS — K449 Diaphragmatic hernia without obstruction or gangrene: Secondary | ICD-10-CM | POA: Diagnosis not present

## 2021-03-05 DIAGNOSIS — Z515 Encounter for palliative care: Secondary | ICD-10-CM

## 2021-03-05 DIAGNOSIS — R262 Difficulty in walking, not elsewhere classified: Secondary | ICD-10-CM | POA: Diagnosis not present

## 2021-03-05 DIAGNOSIS — I6203 Nontraumatic chronic subdural hemorrhage: Secondary | ICD-10-CM | POA: Diagnosis not present

## 2021-03-05 DIAGNOSIS — E43 Unspecified severe protein-calorie malnutrition: Secondary | ICD-10-CM | POA: Diagnosis not present

## 2021-03-05 DIAGNOSIS — K224 Dyskinesia of esophagus: Secondary | ICD-10-CM | POA: Diagnosis not present

## 2021-03-05 DIAGNOSIS — E8729 Other acidosis: Secondary | ICD-10-CM | POA: Diagnosis not present

## 2021-03-05 DIAGNOSIS — M810 Age-related osteoporosis without current pathological fracture: Secondary | ICD-10-CM | POA: Diagnosis not present

## 2021-03-05 DIAGNOSIS — D61818 Other pancytopenia: Secondary | ICD-10-CM | POA: Diagnosis not present

## 2021-03-05 DIAGNOSIS — R5381 Other malaise: Secondary | ICD-10-CM | POA: Diagnosis not present

## 2021-03-05 DIAGNOSIS — S065XAS Traumatic subdural hemorrhage with loss of consciousness status unknown, sequela: Secondary | ICD-10-CM | POA: Diagnosis not present

## 2021-03-05 DIAGNOSIS — E785 Hyperlipidemia, unspecified: Secondary | ICD-10-CM | POA: Diagnosis not present

## 2021-03-05 DIAGNOSIS — I739 Peripheral vascular disease, unspecified: Secondary | ICD-10-CM | POA: Diagnosis not present

## 2021-03-05 DIAGNOSIS — R1314 Dysphagia, pharyngoesophageal phase: Secondary | ICD-10-CM | POA: Diagnosis not present

## 2021-03-05 DIAGNOSIS — M6281 Muscle weakness (generalized): Secondary | ICD-10-CM | POA: Diagnosis not present

## 2021-03-05 DIAGNOSIS — I959 Hypotension, unspecified: Secondary | ICD-10-CM | POA: Diagnosis not present

## 2021-03-05 DIAGNOSIS — Z7189 Other specified counseling: Secondary | ICD-10-CM | POA: Diagnosis not present

## 2021-03-05 DIAGNOSIS — E162 Hypoglycemia, unspecified: Secondary | ICD-10-CM | POA: Diagnosis not present

## 2021-03-05 DIAGNOSIS — D649 Anemia, unspecified: Secondary | ICD-10-CM | POA: Diagnosis not present

## 2021-03-05 DIAGNOSIS — E039 Hypothyroidism, unspecified: Secondary | ICD-10-CM | POA: Diagnosis not present

## 2021-03-05 DIAGNOSIS — Z9181 History of falling: Secondary | ICD-10-CM | POA: Diagnosis not present

## 2021-03-05 DIAGNOSIS — I2510 Atherosclerotic heart disease of native coronary artery without angina pectoris: Secondary | ICD-10-CM | POA: Diagnosis not present

## 2021-03-05 DIAGNOSIS — E440 Moderate protein-calorie malnutrition: Secondary | ICD-10-CM | POA: Diagnosis not present

## 2021-03-05 DIAGNOSIS — Z741 Need for assistance with personal care: Secondary | ICD-10-CM | POA: Diagnosis not present

## 2021-03-05 DIAGNOSIS — R634 Abnormal weight loss: Secondary | ICD-10-CM | POA: Diagnosis not present

## 2021-03-05 DIAGNOSIS — R1319 Other dysphagia: Secondary | ICD-10-CM | POA: Diagnosis not present

## 2021-03-05 DIAGNOSIS — E878 Other disorders of electrolyte and fluid balance, not elsewhere classified: Secondary | ICD-10-CM | POA: Diagnosis not present

## 2021-03-05 DIAGNOSIS — N179 Acute kidney failure, unspecified: Secondary | ICD-10-CM | POA: Diagnosis not present

## 2021-03-05 DIAGNOSIS — E871 Hypo-osmolality and hyponatremia: Secondary | ICD-10-CM | POA: Diagnosis not present

## 2021-03-05 DIAGNOSIS — R627 Adult failure to thrive: Secondary | ICD-10-CM | POA: Diagnosis not present

## 2021-03-05 DIAGNOSIS — R001 Bradycardia, unspecified: Secondary | ICD-10-CM | POA: Diagnosis not present

## 2021-03-05 DIAGNOSIS — E538 Deficiency of other specified B group vitamins: Secondary | ICD-10-CM | POA: Diagnosis not present

## 2021-03-05 DIAGNOSIS — D509 Iron deficiency anemia, unspecified: Secondary | ICD-10-CM | POA: Diagnosis not present

## 2021-03-05 DIAGNOSIS — R2681 Unsteadiness on feet: Secondary | ICD-10-CM | POA: Diagnosis not present

## 2021-03-05 DIAGNOSIS — K219 Gastro-esophageal reflux disease without esophagitis: Secondary | ICD-10-CM | POA: Diagnosis not present

## 2021-03-05 DIAGNOSIS — R1310 Dysphagia, unspecified: Secondary | ICD-10-CM | POA: Diagnosis not present

## 2021-03-05 LAB — RENAL FUNCTION PANEL
Albumin: 2.9 g/dL — ABNORMAL LOW (ref 3.5–5.0)
Anion gap: 7 (ref 5–15)
BUN: 6 mg/dL — ABNORMAL LOW (ref 8–23)
CO2: 23 mmol/L (ref 22–32)
Calcium: 8.9 mg/dL (ref 8.9–10.3)
Chloride: 103 mmol/L (ref 98–111)
Creatinine, Ser: 0.9 mg/dL (ref 0.44–1.00)
GFR, Estimated: >60 mL/min (ref >60)
Glucose, Bld: 86 mg/dL (ref 70–99)
Phosphorus: 3.3 mg/dL (ref 2.5–4.6)
Potassium: 3.8 mmol/L (ref 3.5–5.1)
Sodium: 133 mmol/L — ABNORMAL LOW (ref 135–145)

## 2021-03-05 LAB — CBC
HCT: 33.4 % — ABNORMAL LOW (ref 36.0–46.0)
Hemoglobin: 10.4 g/dL — ABNORMAL LOW (ref 12.0–15.0)
MCH: 24 pg — ABNORMAL LOW (ref 26.0–34.0)
MCHC: 31.1 g/dL (ref 30.0–36.0)
MCV: 77.1 fL — ABNORMAL LOW (ref 80.0–100.0)
Platelets: 118 10*3/uL — ABNORMAL LOW (ref 150–400)
RBC: 4.33 MIL/uL (ref 3.87–5.11)
RDW: 21 % — ABNORMAL HIGH (ref 11.5–15.5)
WBC: 3.2 10*3/uL — ABNORMAL LOW (ref 4.0–10.5)
nRBC: 0 % (ref 0.0–0.2)

## 2021-03-05 LAB — MAGNESIUM: Magnesium: 2.2 mg/dL (ref 1.7–2.4)

## 2021-03-05 MED ORDER — METOPROLOL TARTRATE 25 MG PO TABS: 12.5000 mg | ORAL_TABLET | Freq: Two times a day (BID) | ORAL | Status: DC

## 2021-03-05 MED ORDER — ONDANSETRON 4 MG PO TBDP: 4.0000 mg | ORAL_TABLET | Freq: Three times a day (TID) | ORAL | 0 refills | Status: DC | PRN

## 2021-03-05 MED ORDER — ENSURE ENLIVE PO LIQD: 237.0000 mL | Freq: Two times a day (BID) | ORAL | 12 refills | Status: DC

## 2021-03-05 MED ORDER — FOLIC ACID 1 MG PO TABS: 1.0000 mg | ORAL_TABLET | Freq: Every day | ORAL | Status: DC

## 2021-03-05 MED ORDER — ACETAMINOPHEN 160 MG/5ML PO SOLN: 640.0000 mg | Freq: Four times a day (QID) | ORAL | 0 refills | Status: DC | PRN

## 2021-03-05 MED ORDER — ISOSORBIDE MONONITRATE 10 MG PO TABS: 5.0000 mg | ORAL_TABLET | Freq: Three times a day (TID) | ORAL | Status: DC

## 2021-03-05 MED ORDER — MIDODRINE HCL 2.5 MG PO TABS: 2.5000 mg | ORAL_TABLET | Freq: Three times a day (TID) | ORAL | Status: DC

## 2021-03-05 MED ORDER — FOLIC ACID 1 MG PO TABS
1.0000 mg | ORAL_TABLET | Freq: Every day | ORAL | Status: DC
  Administered 2021-03-05: 1 mg via ORAL
  Filled 2021-03-05: qty 1

## 2021-03-05 MED ORDER — LOPERAMIDE HCL 2 MG/15ML PO SOLN: 2.0000 mg | ORAL | 0 refills | Status: DC | PRN

## 2021-03-05 MED ORDER — TRAZODONE HCL 50 MG PO TABS: 50.0000 mg | ORAL_TABLET | Freq: Every day | ORAL | Status: DC

## 2021-03-05 MED ORDER — ASPIRIN 81 MG PO TABS: 81.0000 mg | ORAL_TABLET | Freq: Every day | ORAL | Status: DC

## 2021-03-05 MED ORDER — ADULT MULTIVITAMIN W/MINERALS CH: 1.0000 | ORAL_TABLET | Freq: Every day | ORAL | Status: DC

## 2021-03-05 NOTE — Progress Notes (Addendum)
° ° °  Subjective: Dysphagia improved. Little appetite. States she normally doesn't eat much like other people. No N/V. No abdominal pain.   Objective: Vital signs in last 24 hours: Temp:  [97.6 F (36.4 C)-98 F (36.7 C)] 98 F (36.7 C) (02/06 0419) Pulse Rate:  [55-73] 55 (02/06 0419) Resp:  [16-18] 17 (02/06 0419) BP: (105-118)/(49-69) 118/58 (02/06 0419) SpO2:  [97 %-100 %] 99 % (02/06 0419) Last BM Date: 03/04/21 General:   Alert and oriented, pleasant, frail. Edentulous.  Head:  Normocephalic and atraumatic. Abdomen:  Bowel sounds present, soft, thin, non-tender, non-distended Neurologic:  Alert and  oriented x4   Intake/Output from previous day: 02/05 0701 - 02/06 0700 In: -  Out: 400 [Urine:400] Intake/Output this shift: No intake/output data recorded.  Lab Results: Recent Labs    03/03/21 0401 03/04/21 0436 03/05/21 0731  WBC 3.1* 2.8* 3.2*  HGB 9.2* 9.0* 10.4*  HCT 31.1* 29.2* 33.4*  PLT 120* 114* 118*   BMET Recent Labs    03/03/21 0401 03/04/21 0436 03/05/21 0731  NA 133* 134* 133*  K 3.4* 4.0 3.8  CL 104 109 103  CO2 21* 20* 23  GLUCOSE 126* 75 86  BUN 11 8 6*  CREATININE 1.21* 0.97 0.90  CALCIUM 8.6* 8.4* 8.9   LFT Recent Labs    03/03/21 0401 03/04/21 0436 03/05/21 0731  PROT 5.5*  --   --   ALBUMIN 2.8* 2.6* 2.9*  AST 31  --   --   ALT 10  --   --   ALKPHOS 86  --   --   BILITOT 0.4  --   --      Assessment: 88 y.o. year old female presenting with failure to thrive, oral intake difficulty, substantial esophageal dysmotility seen on UGI. EGD completed this admission 2/4 with abnormal esophageal motility, suspicious for esophageal spasm, gastric antral vascular ectasia without bleeding, normal duodenum. GEJ dilated with balloon to 15 mm but no disruption noted. Low-dose isosorbide begun over the weekend.   Dysphagia improved currently. She does note little oral intake. Receiving isosorbide mononitrate 5 mg TID before meals.  Tolerating well.  Anemia: multifactorial with low normal ferritin, folate deficiency with folate 5.8.. B12 448. No overt GI bleeding.    Plan: Continue isosorbide mononitrate 5 mg TID prior to meals Continue pantoprazole daily Add folic acid daily Continue dysphagia diet Agree with palliative involvement GI will sign off. Please call if any concerns.    Annitta Needs, PhD, ANP-BC [facility]    LOS: 4 days    03/05/2021, 8:22 AM

## 2021-03-05 NOTE — TOC Transition Note (Signed)
Transition of Care [facility]) - CM/SW Discharge Note   Patient Details  Name: Melissa Bentley MRN: [id number] Date of Birth: [birth year]  Transition of Care [facility]) CM/SW Contact:  Elliot Gault, LCSW Phone Number: 03/05/2021, 2:34 PM   Clinical Narrative:     Pt stable for dc to SNF today. Bed offers presented to son who selected [facility]. Updated Kerri at [facility] and they can take pt today. Auth received. RN to call report. DC clinical sent electronically. There are no other TOC needs for dc.  Final next level of care: Skilled Nursing Facility Barriers to Discharge: Barriers Resolved   Patient Goals and CMS Choice Patient states their goals for this hospitalization and ongoing recovery are:: agreeable to SNF CMS Medicare.gov Compare Post Acute Care list provided to:: Patient Represenative (must comment) Choice offered to / list presented to : Adult Children  Discharge Placement              Patient [facility] bed [facility]: [facility] Patient to be transferred to facility by: w/c Name of family member notified: Hulen Shouts Patient and family notified of of transfer: 03/05/21  Discharge Plan and Services     Post Acute Care Choice: Skilled Nursing Facility                               Social Determinants of Health (SDOH) Interventions     Readmission Risk Interventions Readmission Risk Prevention Plan 03/02/2021  Medication Screening Complete  Transportation Screening Complete  Some recent data might be hidden

## 2021-03-05 NOTE — Assessment & Plan Note (Signed)
Might have some autonomic dysregulation but orthostatic vitals negative.  Cortisol within normal.  Diuretics discontinued.  Metoprolol decreased. -Added low-dose midodrine.  May consider increasing based on BP.

## 2021-03-05 NOTE — Progress Notes (Signed)
Patient ambulated with walker and standby to door and back and sat on bedside commode and then up in chair for several hours.  Continues to have poor appetite and ate around half of lunch and drank small amount of ensure.  Report called to Melissa Bentley at [facility] and to go to room 160

## 2021-03-05 NOTE — Discharge Summary (Signed)
Physician Discharge Summary  Melissa Bentley [id number] DOB: 11/10/1927 DOA: 03/01/2021  PCP: Caryl Bis, MD  Admit date: 03/01/2021 Discharge date: 03/05/2021 Admitted From: Home Disposition: SNF Recommendations for Outpatient Follow-up:  Follow ups as below. Please obtain CBC/BMP/Mag at follow up Reassess blood pressure and adjust medications as appropriate. Recommend palliative follow-up at SNF. Recommend speech follow-up at SNF. Please follow up on the following pending results: Pathology from EGD  Discharge Condition: Stable CODE STATUS: DNR/DNI  Contact information for follow-up providers     Rogene Houston, MD Follow up in 4 week(s).   Specialty: Gastroenterology Why: As needed Contact information: [place], [place] [address] [phone]              Contact information for after-discharge care     [facility] .   Service: Skilled Nursing Contact information: [address] [phone]                     [name] with PMH of remote CAD/stent, HTN, HLD, hypothyroidism, GERD and remote subdural hematoma s/p evacuation brought to ED due to poor p.o. intake, weight loss, lethargy and dysphagia, and admitted for failure to thrive, dysphagia, AKI, and hypoglycemia.  She also had a fall at home on 02/21/2021.   In ED, bradycardic to 40s and 50s.  Na 128. Cr 1.56.  BUN 21.  Bicarb 17.  AG 16.  AST 43. Hgb 11.4.  MCV 77.3.  Platelets clumped.  CBG 55.  TSH 0.36.  UA negative.  CXR and CTA head and neck without acute finding.  Patient was started on D10.  GI and palliative medicine consulted.   Esophagram with substantial esophageal dysmotility disorder concerning for presbyesophagus, epiphrenic diverticula or gastric diverticula, hiatal hernia and possible food in esophagus.    EGD on 2/4 with abnormal esophageal motility suspicious for  esophageal spasm, 6 cm hiatal hernia, gastric antral vascular ectasia without bleeding.  GI started low-dose nitrate for esophageal dysmotility and recommended pured diet.  She will remain on PPI.  Recommend speech follow-up at SNF.  Outpatient follow-up with GI in 2 to 4 weeks.  Patient's AKI, acidosis, hypoglycemia and electrolyte derangement resolved.  Bradycardia improved.   Therapy recommended SNF.     See individual problem list below for more on hospital course.  Discharge Diagnoses:  Assessment and Plan: * Failure to thrive due to dysphagia- (present on admission) Presented with dysphagia, poor p.o. intake,  weight loss, weakness, hypoglycemia to 55.  Evidence of starvation ketosis on blood work.  She weighed 46.7 kg in 04/2019.  It looks like she lost about 7.5 kg since then. Esophagram with substantial esophageal dysmotility disorder concerning for presbyesophagus, epiphrenic diverticula or gastric diverticula, hiatal hernia and possible food in esophagus.  EGD concerning for esophageal dysmotility.  See details above. -Pured diet -Continue low-dose nitrate per GI -Continue Protonix -Continue speech therapy -Outpatient follow-up with GI in 2 to 4 weeks -Palliative follow-up at SNF  Hypotension Might have some autonomic dysregulation but orthostatic vitals negative.  Cortisol within normal.  Diuretics discontinued.  Metoprolol decreased. -Added low-dose midodrine.  May consider increasing based on BP.  AKI (acute kidney injury) ([place])- (present on admission) Likely prerenal from poor p.o. intake and dehydration.  Last known Cr 0.67 in 01/2019.  Cr 1.56>> 0.90. Improving. -Check renal function in 1 to 2 weeks  Hypoglycemia Resolved.  Physical deconditioning- (present on admission) -Continue PT/OT  Esophageal dysmotility- (present on admission) See dysphagia  Goals of care, counseling/discussion DNR/DNI appropriate. Palliative follow-up at SNF.  Pancytopenia ([place])-  (present on admission) Likely from malnutrition and hemodilution.  Anemia panel suggests some degree of iron deficiency.  Received IV iron.  Improved. -Check CBC 1 to 2 weeks  Hypokalemia, hypomagnesemia and hypophosphatemia- (present on admission) Resolved.  Hyponatremia Likely hypovolemic from poor p.o. intake and dehydration.  Improved. -Recheck in 1 to 2 weeks  High anion gap metabolic acidosis- (present on admission) Likely starvation ketosis from poor p.o. intake.  Resolved.  Protein-calorie malnutrition, severe ([place])- (present on admission) -Continue feeding supplements -Liberate diet   Body mass index is 16.83 kg/m. Nutrition Problem: Severe Malnutrition Etiology: acute illness (swallow problem) Signs/Symptoms: energy intake < or equal to 50% for > or equal to 5 days, moderate muscle depletion, moderate fat depletion, mild fat depletion, severe muscle depletion    Pressure Injury 10/03/17 Stage I -  Intact skin with non-blanchable redness of a localized area usually over a bony prominence. (Active)  10/03/17 1853  Location: Coccyx  Location Orientation: Medial  Staging: Stage I -  Intact skin with non-blanchable redness of a localized area usually over a bony prominence.  Wound Description (Comments):   Present on Admission:     Discharge Exam: Vitals:   03/04/21 1045 03/04/21 1353 03/04/21 2145 03/05/21 0419  BP: 109/63 110/69 (!) 107/49 (!) 118/58  Pulse: 73 (!) 57 (!) 55 (!) 55  Temp:  97.6 F (36.4 C) 97.8 F (36.6 C) 98 F (36.7 C)  Resp:  18 16 17   Height:      Weight:      SpO2:  100% 97% 99%  TempSrc:  Axillary Oral Oral  BMI (Calculated):         GENERAL: Frail looking elderly female.  No apparent distress. HEENT: MMM.  Vision grossly intact.  Hard of hearing. NECK: Supple.  No apparent JVD.  RESP: 99% on RA.  No IWOB.  Fair aeration bilaterally. CVS:  RRR. Heart sounds normal.  ABD/GI/GU: Bowel sounds present. Soft. Non tender.  MSK/EXT:   Moves extremities.  Significant muscle mass and subcu fat loss. SKIN: no apparent skin lesion or wound NEURO: Awake and alert.  Oriented to self, family and place.  No apparent focal neuro deficit. PSYCH: Calm. Normal affect.   Discharge Instructions  Discharge Instructions     Diet general   Complete by: As directed    Pured diet   Increase activity slowly   Complete by: As directed    No wound care   Complete by: As directed       Allergies as of 03/05/2021   No Known Allergies      Medication List     STOP taking these medications    cilostazol 100 MG tablet Commonly known as: PLETAL   furosemide 20 MG tablet Commonly known as: LASIX   potassium chloride 10 MEQ tablet Commonly known as: KLOR-CON       TAKE these medications    acetaminophen 160 MG/5ML solution Commonly known as: TYLENOL Take 20 mLs (640 mg total) by mouth every 6 (six) hours as needed for fever, headache or mild pain.   aspirin 81 MG tablet Take 1 tablet (81 mg total) by mouth daily. Reported on 04/27/2015 Start taking on: March 12, 2021 What changed: These instructions start on March 12, 2021. If you are unsure what to do until then, ask  your doctor or other care provider.   atorvastatin 40 MG tablet Commonly known as: LIPITOR Take 40 mg by mouth daily at 6 PM.   DULoxetine 30 MG capsule Commonly known as: CYMBALTA Take 30 mg by mouth daily.   feeding supplement Liqd Take 237 mLs by mouth 2 (two) times daily between meals.   folic acid 1 MG tablet Commonly known as: FOLVITE Take 1 tablet (1 mg total) by mouth daily. Start taking on: March 06, 2021   isosorbide mononitrate 10 MG tablet Commonly known as: ISMO Take 0.5 tablets (5 mg total) by mouth 3 (three) times daily before meals.   levothyroxine 50 MCG tablet Commonly known as: SYNTHROID Take 50 mcg by mouth daily.   loperamide HCl 2 MG/15ML solution Commonly known as: IMODIUM Take 15 mLs (2 mg total) by mouth  as needed for diarrhea or loose stools.   metoprolol tartrate 25 MG tablet Commonly known as: LOPRESSOR Take 0.5 tablets (12.5 mg total) by mouth 2 (two) times daily. What changed:  how much to take how to take this   midodrine 2.5 MG tablet Commonly known as: PROAMATINE Take 1 tablet (2.5 mg total) by mouth 3 (three) times daily with meals.   multivitamin with minerals Tabs tablet Take 1 tablet by mouth daily. Start taking on: March 06, 2021   ondansetron 4 MG disintegrating tablet Commonly known as: ZOFRAN-ODT Take 1 tablet (4 mg total) by mouth every 8 (eight) hours as needed for nausea or vomiting.   pantoprazole 40 MG tablet Commonly known as: PROTONIX Take 40 mg by mouth daily.   traZODone 50 MG tablet Commonly known as: DESYREL Take 1 tablet (50 mg total) by mouth at bedtime. What changed: how much to take        Consultations: Gastroenterology  Procedures/Studies: EGD on 2/4 with abnormal esophageal motility suspicious for esophageal spasm, 6 cm hiatal hernia, gastric antral vascular ectasia without bleeding.  GI recommended low-dose nitrate, full liquid diet and advancing to soft pured diet.   DG Knee 1-2 Views Left  Result Date: 03/01/2021 CLINICAL DATA:  [place] yesterday, left knee pain anteriorly EXAM: LEFT KNEE - 1-2 VIEW COMPARISON:  02/21/2021 FINDINGS: Frontal and lateral views of the left knee are obtained. No acute fracture, subluxation, or dislocation. No joint effusion. Soft tissues are unremarkable. Diffuse vascular calcifications again noted. IMPRESSION: 1. No acute bony abnormality. Electronically Signed   By: Randa Ngo M.D.   On: 03/01/2021 19:47   DG Ankle Complete Left  Result Date: 02/21/2021 CLINICAL DATA:  Left leg pain.  Bruising.  Fall? EXAM: LEFT ANKLE COMPLETE - 3+ VIEW COMPARISON:  None. FINDINGS: There is no evidence of fracture, dislocation, or joint effusion. The bones are under mineralized. Ankle mortise is preserved. Intact  talar dome. There is no evidence of arthropathy or other focal bone abnormality. Soft tissues are unremarkable. IMPRESSION: No fracture or subluxation of the left ankle. Electronically Signed   By: Keith Rake M.D.   On: 02/21/2021 16:51   CT HEAD WO CONTRAST (5MM)  Result Date: 03/01/2021 CLINICAL DATA:  Neuro deficit, neurodegenerative disorder suspected WEAKNESS, DYSPHAGIA EXAM: CT HEAD WITHOUT CONTRAST TECHNIQUE: Contiguous axial images were obtained from the base of the skull through the vertex without intravenous contrast. RADIATION DOSE REDUCTION: This exam was performed according to the departmental dose-optimization program which includes automated exposure control, adjustment of the mA and/or kV according to patient size and/or use of iterative reconstruction technique. COMPARISON:  CT head 12/15/2020, CT head  9/8/ 2019 BRAIN: BRAIN Cerebral ventricle sizes are concordant with the degree of cerebral volume loss. Patchy and confluent areas of decreased attenuation are noted throughout the deep and periventricular white matter of the cerebral hemispheres bilaterally, compatible with chronic microvascular ischemic disease. Redemonstration of right temporoparietal lobe encephalomalacia. No evidence of large-territorial acute infarction. No parenchymal hemorrhage. No mass lesion. Postsurgical changes along the right calvarial convexity with no definite extra-axial collection. No mass effect or midline shift. No hydrocephalus. Basilar cisterns are patent. Vascular: No hyperdense vessel. Atherosclerotic calcifications are present within the cavernous internal carotid and vertebral arteries. Skull: Chronic sclerotic lesion along the left sphenoid greater wing (3:20). No acute fracture or focal lesion. Prior right craniotomy surgical changes. Sinuses/Orbits: Paranasal sinuses and mastoid air cells are clear. Bilateral lens replacement. Otherwise the orbits are unremarkable. Other: None. IMPRESSION: No acute  intracranial abnormality. Electronically Signed   By: Iven Finn M.D.   On: 03/01/2021 20:21   CT Soft Tissue Neck Wo Contrast  Result Date: 03/01/2021 CLINICAL DATA:  Provided history: Foreign body suspected, neck, negative x-ray. Additional history provided: Patient reports unable to swallow. EXAM: CT NECK WITHOUT CONTRAST TECHNIQUE: Multidetector CT imaging of the neck was performed following the standard protocol without intravenous contrast. RADIATION DOSE REDUCTION: This exam was performed according to the departmental dose-optimization program which includes automated exposure control, adjustment of the mA and/or kV according to patient size and/or use of iterative reconstruction technique. COMPARISON:  None. FINDINGS: Pharynx and larynx: The patient is edentulous. No appreciable swelling or discrete mass within the oral cavity, pharynx or larynx on this non-contrast examination. Punctate postinflammatory calcifications/tonsilliths within the palatine tonsils, bilaterally. No foreign body is identified within the visualized aerodigestive tract. Salivary glands: No inflammation, mass, or stone. Atrophic parotid and submandibular glands. Thyroid: The gland is small, but otherwise unremarkable. Lymph nodes: No pathologically enlarged cervical chain lymph nodes are identified. Vascular: There is limited evaluation of the major vascular structures of the neck in the absence of intravenous contrast. Calcified atherosclerotic plaque within the visualized aortic arch, proximal major branch vessels of the neck and carotid arteries. Limited intracranial: Separately reported on concurrently performed non-contrast head CT. Visualized orbits: The orbits are excluded from the field of view. Mastoids and visualized paranasal sinuses: The frontal and ethmoid sinuses are excluded from the field of view. The sphenoid and maxillary sinuses are partially imaged. No significant paranasal sinus disease or mastoid effusion  at the imaged levels. Skeleton: Cervical spondylosis. No acute bony abnormality or aggressive osseous lesion. Upper chest: Advanced centrilobular emphysema. Scarring within the upper lobes, bilaterally. IMPRESSION: No foreign body is identified within the imaged portions of the aerodigestive tract. No appreciable swelling or discrete mass within the oral cavity, pharynx or larynx on this non-contrast examination. Punctate postinflammatory calcifications/tonsilliths within the palatine tonsils, bilaterally. Cervical spondylosis. Aortic Atherosclerosis (ICD10-I70.0) and Emphysema (ICD10-J43.9). Bilateral upper lobe pulmonary scarring. Electronically Signed   By: Kellie Simmering D.O.   On: 03/01/2021 20:25   CT PELVIS WO CONTRAST  Result Date: 02/21/2021 CLINICAL DATA:  LEFT hip pain after fall at home today, abnormal radiographs demonstrating pelvic fracture EXAM: CT PELVIS WITHOUT CONTRAST TECHNIQUE: Multidetector CT imaging of the pelvis was performed following the standard protocol without intravenous contrast. RADIATION DOSE REDUCTION: This exam was performed according to the departmental dose-optimization program which includes automated exposure control, adjustment of the mA and/or kV according to patient size and/or use of iterative reconstruction technique. COMPARISON:  LEFT hip radiographs 02/21/2021, CT pelvis 12/15/2020 FINDINGS:  Urinary Tract:  Distal ureters and bladder unremarkable. Bowel: Slightly increased stool in distal colon. Minimal distal colonic diverticulosis. Pelvic bowel loops otherwise unremarkable. Vascular/Lymphatic: Dense atherosclerotic calcifications aorta, iliac arteries, femoral arteries. No adenopathy. Reproductive:  Uterus surgically absent.  Unremarkable ovaries. Other:  No free air or free fluid.  No hernia. Musculoskeletal: Marked osseous demineralization. Degenerative facet and disc disease changes at lower lumbar spine. Sacrum intact. Hip and SI joint spaces preserved. Subacute  healing fractures of the RIGHT superior and inferior pubic rami identified, demonstrating sclerosis and callus. Femora intact. No femoral fracture or dislocation. IMPRESSION: Subacute healing fractures of the RIGHT superior and inferior pubic rami. The LEFT inferior pubic ramus fracture was present on the prior CT of 12/15/2020. The LEFT superior pubic ramus fracture was not seen on the previous study but is subacute. Minimal distal colonic diverticulosis. Aortic Atherosclerosis (ICD10-I70.0). Electronically Signed   By: Lavonia Dana M.D.   On: 02/21/2021 18:15   DG Chest Port 1 View  Result Date: 03/01/2021 CLINICAL DATA:  weakness EXAM: PORTABLE CHEST 1 VIEW COMPARISON:  None. FINDINGS: The heart and mediastinal contours are normal. Aortic calcification. Hyperinflation of the lungs. Biapical pleural/pulmonary scarring. Surgical staples noted overlying the left apex. No focal consolidation. Chronically coarsened insert markings with no overt pulmonary edema. No pleural effusion. No pneumothorax. No acute osseous abnormality. IMPRESSION: 1. No active disease. 2. Aortic Atherosclerosis (ICD10-I70.0) and Emphysema (ICD10-J43.9). Electronically Signed   By: Iven Finn M.D.   On: 03/01/2021 15:46   DG Knee Complete 4 Views Left  Result Date: 02/21/2021 CLINICAL DATA:  Left leg pain.  Fall? EXAM: LEFT KNEE - COMPLETE 4+ VIEW COMPARISON:  None. FINDINGS: The bones are diffusely under mineralized. No evidence of fracture, dislocation, or joint effusion. No evidence of arthropathy or other focal bone abnormality. Vascular calcifications. Soft tissues are unremarkable. IMPRESSION: No acute fracture or dislocation of the left knee. Electronically Signed   By: Keith Rake M.D.   On: 02/21/2021 16:48   DG Hip Unilat With Pelvis 2-3 Views Left  Result Date: 02/21/2021 CLINICAL DATA:  Left leg pain.  Bruising.  Fall? EXAM: DG HIP (WITH OR WITHOUT PELVIS) 2-3V LEFT COMPARISON:  Pelvis CT 12/15/2020 FINDINGS:  Nondisplaced left acetabular fracture is the puboacetabular junction. Nondisplaced left inferior pubic ramus fracture with callus formation, also seen on prior CT. Femoral head remains seated. Normal hip joint space. The bones are diffusely under mineralized. No evidence of avascular necrosis or erosion. Vascular calcifications. Soft tissues are atrophic. IMPRESSION: 1. Nondisplaced left acetabular fracture at the puboacetabular junction, possibly acute, not definitively seen on prior pelvic CT. 2. Nondisplaced left inferior pubic ramus fracture with callus formation is subacute. Electronically Signed   By: Keith Rake M.D.   On: 02/21/2021 16:47   DG ESOPHAGUS W SINGLE CM (SOL OR THIN BA)  Result Date: 03/02/2021 CLINICAL DATA:  Difficulty swallowing, especially food. EXAM: ESOPHOGRAM/BARIUM SWALLOW TECHNIQUE: Single contrast examination was performed using  thin barium. FLUOROSCOPY: Radiation Exposure Index (as provided by the fluoroscopic device): 14.7 mGy Kerma COMPARISON:  CT neck 03/01/2021 FINDINGS: The patient has substantial infirmity partially due to pelvic fractures. As result, the exam was primarily carried out in the LPO position although some supine and RPO positioning was also utilized to better assess findings at the gastroesophageal junction. Because of the limitations in positioning and the patient's inability to stand, the pharyngeal phase of swallowing is only assessed in a very limited fashion. On initial images there are postoperative findings  in the left lung apex. Atherosclerotic calcification of the aortic arch. Substantial presbyesophagus appearance with corkscrew contour the esophagus and extensive tertiary contractions. Primary peristaltic waves are disrupted in the mid upper esophagus on all swallows, with bolus propulsion mostly from secondary contractions superimposed on the tertiary contractions. On image 35 of series 4, concern is raised for a filling defect in the upper  thoracic esophagus just above the level of the aortic arch. The esophagram appearance is concerning for a mass, but I do not see a well-defined mass on the neck CT of 03/01/2021 and it is possible that this filling defect might be from a food bolus somewhat stuck in this location. The corkscrew esophagus leads into an unusual appearance the gastroesophageal junction. Initially for example on image 40 of series 2, I thought this might be due to a cluster of epiphrenic diverticula, but with further filling an using different angulations, I suspect that there is probably a moderate-sized hiatal hernia, possibly with gastric and adjacent epiphrenic diverticulum. Moreover, on RPO images such as image 52 of series 8, there seems to be a potential masslike filling defect along the left side of this potential hiatal hernia. Conceivably this could be from a partially unwrapped fundoplication but a tumor within a hiatal hernia is a differential diagnostic concern. IMPRESSION: 1. Substantial esophageal dysmotility disorder with disruption of primary peristaltic waves and extensive tertiary and some secondary contractions. Corkscrew appearance of the esophagus compatible with presbyesophagus. 2. In the vicinity of the gastroesophageal junction, there is an appearance of 2-3 unusual outpouchings of variable size with possibilities including epiphrenic diverticula, or gastric diverticula associated with a hiatal hernia. On series 8, the possibility of an irregular 1.7 cm in diameter filling defect within this presumed hiatal hernia is raised, and malignancy is a possibility. Endoscopy is likely warranted. 3. There is also a filling defect along the luminal margins of the upper thoracic esophagus just above the aorta, but we have cross-sectional imaging of the neck from 03/01/2021 through this region which did not reveal a mass, and accordingly it is possible that this represents some stuck food in the esophagus forming the  filling defect. This could also be assessed at endoscopy. Electronically Signed   By: Van Clines M.D.   On: 03/02/2021 10:28       The results of significant diagnostics from this hospitalization (including imaging, microbiology, ancillary and laboratory) are listed below for reference.     Microbiology: Recent Results (from the past 240 hour(s))  Urine Culture     Status: Abnormal   Collection Time: 03/01/21  5:56 PM   Specimen: Urine, Catheterized  Result Value Ref Range Status   Specimen Description   Final    URINE, CATHETERIZED Performed at [facility], [address], [place], [place] [address]    Special Requests   Final    NONE Performed at [facility], [address] [place], [place] [address]    Culture MULTIPLE SPECIES PRESENT, SUGGEST RECOLLECTION (A)  Final   Report Status 03/03/2021 FINAL  Final     Labs:  CBC: Recent Labs  Lab 03/01/21 1550 03/02/21 0430 03/03/21 0401 03/04/21 0436 03/05/21 0731  WBC 5.3 4.1 3.1* 2.8* 3.2*  NEUTROABS 3.6  --   --   --   --   HGB 11.4* 9.9* 9.2* 9.0* 10.4*  HCT 38.5 33.2* 31.1* 29.2* 33.4*  MCV 77.3* 75.3* 76.6* 76.4* 77.1*  PLT ACLMP 133* 120* 114* 118*   BMP &GFR Recent Labs  Lab 03/01/21 1550 03/02/21 0430 03/03/21 0401 03/04/21 0436 03/05/21 0731  NA 128* 131* 133* 134* 133*  K 4.1 2.8* 3.4* 4.0 3.8  CL 95* 101 104 109 103  CO2 17* 22 21* 20* 23  GLUCOSE 93 124* 126* 75 86  BUN 21 17 11 8  6*  CREATININE 1.56* 1.38* 1.21* 0.97 0.90  CALCIUM 9.3 8.4* 8.6* 8.4* 8.9  MG 2.3 1.9 1.9 1.7 2.2  PHOS  --   --  2.3* 2.8 3.3   Estimated Creatinine Clearance: 25.1 mL/min (by C-G formula based on SCr of 0.9 mg/dL). Liver & Pancreas: Recent Labs  Lab 03/01/21 1550 03/03/21 0401 03/04/21 0436 03/05/21 0731  AST 43* 31  --   --   ALT 13 10  --   --   ALKPHOS 125 86  --   --   BILITOT 0.9 0.4  --   --   PROT 7.8 5.5*  --   --   ALBUMIN 4.3 2.8* 2.6* 2.9*   No results for input(s): LIPASE, AMYLASE  in the last 168 hours. No results for input(s): AMMONIA in the last 168 hours. Diabetic: No results for input(s): HGBA1C in the last 72 hours. Recent Labs  Lab 03/02/21 2003 03/03/21 0710 03/03/21 0912 03/03/21 1103 03/03/21 1632  GLUCAP 171* 121* 95 86 121*   Cardiac Enzymes: Recent Labs  Lab 03/02/21 1303  CKTOTAL 57   No results for input(s): PROBNP in the last 8760 hours. Coagulation Profile: No results for input(s): INR, PROTIME in the last 168 hours. Thyroid Function Tests: No results for input(s): TSH, T4TOTAL, FREET4, T3FREE, THYROIDAB in the last 72 hours. Lipid Profile: No results for input(s): CHOL, HDL, LDLCALC, TRIG, CHOLHDL, LDLDIRECT in the last 72 hours. Anemia Panel: No results for input(s): VITAMINB12, FOLATE, FERRITIN, TIBC, IRON, RETICCTPCT in the last 72 hours. Urine analysis:    Component Value Date/Time   COLORURINE YELLOW 03/01/2021 [place] 03/01/2021 1757   LABSPEC 1.015 03/01/2021 1757   PHURINE 6.0 03/01/2021 1757   GLUCOSEU NEGATIVE 03/01/2021 1757   HGBUR NEGATIVE 03/01/2021 1757   BILIRUBINUR NEGATIVE 03/01/2021 1757   KETONESUR NEGATIVE 03/01/2021 1757   PROTEINUR NEGATIVE 03/01/2021 1757   NITRITE NEGATIVE 03/01/2021 1757   LEUKOCYTESUR NEGATIVE 03/01/2021 1757   Sepsis Labs: Invalid input(s): PROCALCITONIN, LACTICIDVEN   Time coordinating discharge: 55 minutes  SIGNED:  Mercy Riding, MD  [facility] 03/05/2021, 2:10 PM

## 2021-03-05 NOTE — Care Management Important Message (Signed)
Important Message  Patient Details  Name: Melissa Bentley MRN: [id number] Date of Birth: 11/26/1927   Medicare Important Message Given:  Yes     Corey Harold 03/05/2021, 2:02 PM

## 2021-03-05 NOTE — TOC Progression Note (Signed)
Transition of Care [facility]) - Progression Note    Patient Details  Name: Melissa Bentley MRN: [id number] Date of Birth: Jan 06, 1928  Transition of Care [facility]) CM/SW [place], [place] Phone Number: 03/05/2021, 1:23 PM  Clinical Narrative:    CSW spoke to [place] in admissions at [facility] as referral was still pending. Mardene Celeste states that they are unable to offer a bed at this time.   CSW spoke with pts son Mr. Kenton Kingfisher to explain that [facility] could not make a bed offer as this was their preferred facility. CSW spoke with Mr. Kenton Kingfisher about pts other bed offers. They would like to accept the bed offer at [facility]. CSW updated in [place]. CSW to add [facility] to pts auth through [place].    Expected Discharge Plan: [place] Barriers to Discharge: Continued Medical Work up  Expected Discharge Plan and Services Expected Discharge Plan: [place] Choice: [place] arrangements for the past 2 months: Single Family Home                                       Social Determinants of Health (SDOH) Interventions    Readmission Risk Interventions Readmission Risk Prevention Plan 03/02/2021  Medication Screening Complete  Transportation Screening Complete  Some recent data might be hidden

## 2021-03-05 NOTE — Consult Note (Signed)
Consultation Note Date: 03/05/2021   Patient Name: Melissa Bentley  DOB: [birth year]  MRN: [id number]  Age / Sex: 88 y.o., female  PCP: Caryl Bis, MD Referring Physician: Mercy Riding, MD  Reason for Consultation: Establishing goals of care  HPI/Patient Profile: 88 y.o. female  with past medical history of CAD s/p stent, HTN, HLD, PVD, hypothyroidism, subdural hematoma s/p evacuation, GERD, osteoporosis, depression admitted on 03/01/2021 with weakness, weight loss, poor intake/appetite, difficulty swallowing, lethargy with failure to thrive. PCP offered hospice care and family wanted evaluation prior to initiating hospice. EGD 2/4 with concern for esophageal spasm, 6 cm hiatal hernia, gastric antral vascular ectasia without bleeding and GE junction dilated, placed on low dose nitrate, full liquid diet.   Clinical Assessment and Goals of Care: I met today with Melissa Bentley with her son Melissa Bentley") at bedside. Melissa Bentley is eating a little. They both agree that her intake has improved although still not great but she tells me that she has always been a light eater. We reviewed her overall decline and concern that she may be limited on returning to her previous function and abilities. She is able to acknowledge this and she is able to tell me that she knows she is 88 years old and that her time is limited. However, she does desire to go to rehab and work with therapy with a goal to get strong enough to return back to her home (she lives alone). She does understand that this may not be possible. She is able to talk openly about end of life noting that her main concern is really some family issues where some people are not talking with her and are estranged. I encouraged her to focus on doing her part and what she can do to make this situation better and to open the door to a better relationship and as long as  she is doing her part hopefully she can find peace with knowing she has put forth the effort. We also reviewed that we cannot control other peoples reactions and decisions.   I spoke more with Melissa Bentley outside room. He has good understanding of his mother's decline. He was told by PCP and recommended consideration of hospice but he wished for her to come to the hospital to ensure that everything had been done to help her improve before going with hospice. He agrees with rehab. He reports that she will not be able to return home as she has had many falls and there is nobody that can be with her and he is unable to physically care for her due to his own physical limitations. We discussed long term care and we also discussed signs of decline that could allow for her to be eligible for hospice as well.   All questions/concerns addressed. Emotional support provided. They both agree with palliative to follow outpatient.   Primary Decision Maker PATIENT    SUMMARY OF RECOMMENDATIONS   - DNR already established - Transition to SNF rehab with palliative to  follow - Open to hospice options as indicated in the future  Code Status/Advance Care Planning: DNR   Symptom Management:  Per attending.   Palliative Prophylaxis:  Bowel Regimen, Delirium Protocol, Frequent Pain Assessment, and Turn Reposition  Prognosis:  < 6 months very likely  Discharge Planning: [place] for rehab with Palliative care service follow-up      Primary Diagnoses: Present on Admission:  Failure to thrive due to dysphagia  Protein-calorie malnutrition, severe (HCC)  AKI (acute kidney injury) ([place])  High anion gap metabolic acidosis  Hypokalemia, hypomagnesemia and hypophosphatemia  Pancytopenia (HCC)  Esophageal dysmotility  Physical deconditioning   I have reviewed the medical record, interviewed the patient and family, and examined the patient. The following aspects are pertinent.  Past Medical  History:  Diagnosis Date   CAD (coronary artery disease)    Depression    GERD (gastroesophageal reflux disease)    Hyperlipidemia    Hypertension    Hypothyroidism    MI (myocardial infarction) ([place])    Osteoporosis    Psoriasis    PVD (peripheral vascular disease) ([place])    Social History   Socioeconomic History   Marital status: Widowed    Spouse name: Not on file   Number of children: 2   Years of education: colllege education   Highest education level: Associate degree: occupational, Hotel manager, or vocational program  Occupational History   Occupation: retired Quarry manager    Comment: disabled   Tobacco Use   Smoking status: Former    Types: Cigarettes    Quit date: 01/17/1996    Years since quitting: 25.1   Smokeless tobacco: Never  Vaping Use   Vaping Use: Never used  Substance and Sexual Activity   Alcohol use: No    Alcohol/week: 0.0 standard drinks   Drug use: No   Sexual activity: Not on file  Other Topics Concern   Not on file  Social History Narrative   Not on file   Social Determinants of Health   Financial Resource Strain: Not on file  Food Insecurity: Not on file  Transportation Needs: Not on file  Physical Activity: Not on file  Stress: Not on file  Social Connections: Not on file   Family History  Problem Relation Age of Onset   Lung cancer Sister    Macular degeneration Sister    Colon cancer Neg Hx    Scheduled Meds:  atorvastatin  40 mg Oral q1800   DULoxetine  30 mg Oral Daily   feeding supplement  1 Container Oral TID BM   feeding supplement  237 mL Oral BID BM   isosorbide mononitrate  5 mg Oral TID AC   levothyroxine  50 mcg Oral Daily   metoprolol tartrate  12.5 mg Oral BID   midodrine  2.5 mg Oral TID WC   multivitamin with minerals  1 tablet Oral Daily   pantoprazole  40 mg Oral Daily   sodium chloride flush  3 mL Intravenous Q12H   Continuous Infusions:  sodium chloride     PRN Meds:.sodium chloride, acetaminophen  (TYLENOL) oral liquid 160 mg/5 mL, loperamide HCl, ondansetron (ZOFRAN) IV, sodium chloride flush No Known Allergies Review of Systems  Constitutional:  Positive for activity change, appetite change and fatigue.  Neurological:  Positive for weakness.   Physical Exam Vitals and nursing note reviewed.  Constitutional:      General: She is not in acute distress.    Appearance: She is cachectic. She is ill-appearing.  Comments: Thin, frail, elderly  Cardiovascular:     Rate and Rhythm: Bradycardia present.  Pulmonary:     Effort: No tachypnea, accessory muscle usage or respiratory distress.  Abdominal:     General: Abdomen is flat.  Neurological:     Mental Status: She is alert and oriented to person, place, and time.    Vital Signs: BP (!) 118/58 (BP Location: Left Arm)    Pulse (!) 55    Temp 98 F (36.7 C) (Oral)    Resp 17    Ht 5' (1.524 m)    Wt 39.1 kg    SpO2 99%    BMI 16.83 kg/m  Pain Scale: 0-10   Pain Score: 0-No pain   SpO2: SpO2: 99 % O2 Device:SpO2: 99 % O2 Flow Rate: .O2 Flow Rate (L/min): 1 L/min  IO: Intake/output summary:  Intake/Output Summary (Last 24 hours) at 03/05/2021 9079 Last data filed at 03/05/2021 0415 Gross per 24 hour  Intake --  Output 400 ml  Net -400 ml    LBM: Last BM Date: 03/04/21 Baseline Weight: Weight: 46.7 kg Most recent weight: Weight: 39.1 kg     Palliative Assessment/Data:     Time Total: 60 min  Greater than 50%  of this time was spent counseling and coordinating care related to the above assessment and plan.  Signed by: Vinie Sill, NP Palliative Medicine Team Pager # [phone] (M-F 8a-5p) Team Phone # [phone] (Nights/Weekends)

## 2021-03-06 ENCOUNTER — Encounter: Payer: Self-pay | Admitting: Adult Health

## 2021-03-06 ENCOUNTER — Non-Acute Institutional Stay (SKILLED_NURSING_FACILITY): Payer: Medicare Other | Admitting: Adult Health

## 2021-03-06 DIAGNOSIS — F339 Major depressive disorder, recurrent, unspecified: Secondary | ICD-10-CM | POA: Insufficient documentation

## 2021-03-06 DIAGNOSIS — I739 Peripheral vascular disease, unspecified: Secondary | ICD-10-CM

## 2021-03-06 DIAGNOSIS — I959 Hypotension, unspecified: Secondary | ICD-10-CM | POA: Diagnosis not present

## 2021-03-06 DIAGNOSIS — D649 Anemia, unspecified: Secondary | ICD-10-CM

## 2021-03-06 DIAGNOSIS — I6203 Nontraumatic chronic subdural hemorrhage: Secondary | ICD-10-CM

## 2021-03-06 DIAGNOSIS — E039 Hypothyroidism, unspecified: Secondary | ICD-10-CM | POA: Diagnosis not present

## 2021-03-06 DIAGNOSIS — N179 Acute kidney failure, unspecified: Secondary | ICD-10-CM | POA: Diagnosis not present

## 2021-03-06 DIAGNOSIS — E43 Unspecified severe protein-calorie malnutrition: Secondary | ICD-10-CM | POA: Diagnosis not present

## 2021-03-06 DIAGNOSIS — K224 Dyskinesia of esophagus: Secondary | ICD-10-CM

## 2021-03-06 DIAGNOSIS — E538 Deficiency of other specified B group vitamins: Secondary | ICD-10-CM

## 2021-03-06 NOTE — Progress Notes (Signed)
Location:  [place] Room Number: 160 Place of Service:  SNF (31)   CODE STATUS: dnr   No Known Allergies  Chief Complaint  Patient presents with   Hospitalization Follow-up    HPI:  She is a 88 year old woman who has been hospitalized from 03-01-21 through 03-05-21. Her medical history includes CAD; htn; hld; hypothyroidism GERD remove subdural hematoma. She was brought to the ED for increased weakness and decreased po intake; weight loss; dysphagia and weight loss. She was admitted to the hospital for failure to thrive; dysphagia; AKI; and hypoglycemia. She had a GI consult as well palliative care. Her EGD: abnormal esophageal motility suspicious for esophageal spasm; a 6 cm hiatal hernia; gastric antral vascular ectasia without bleeding. She will need long term PPI. She will need to follow up with GI in 2-4 weeks. She is here for short term rehab with her goal is undetermined at this time. She will continue to be followed for her chronic illnesses including:   Esophageal dysmotility:  AKI (acute kidney injury) Protein calorie malnutrition severe Folic acid deficiency:  Past Medical History:  Diagnosis Date   CAD (coronary artery disease)    Depression    GERD (gastroesophageal reflux disease)    Hyperlipidemia    Hypertension    Hypothyroidism    MI (myocardial infarction) ([place])    Osteoporosis    Psoriasis    PVD (peripheral vascular disease) ([place])     Past Surgical History:  Procedure Laterality Date   ABDOMINAL HYSTERECTOMY     CATARACT EXTRACTION Bilateral    COLONOSCOPY N/A 02/01/2015   Dr. Rourk:colonic diverticulosis s/p biopsy with unremarkable colonic mucosa, no microscopic colitis.    CORONARY ANGIOPLASTY WITH STENT PLACEMENT     CRANIOTOMY Right 10/03/2017   Procedure: CRANIOTOMY HEMATOMA EVACUATION SUBDURAL;  Surgeon: Ashok Pall, MD;  Location: [place];  Service: Neurosurgery;  Laterality: Right;   CRANIOTOMY Right 10/05/2017   Procedure:  REDO CRANIOTOMY HEMATOMA EVACUATION SUBDURAL;  Surgeon: Ashok Pall, MD;  Location: [place];  Service: Neurosurgery;  Laterality: Right;   ESOPHAGEAL DILATION  03/03/2021   Procedure: ESOPHAGEAL DILATION;  Surgeon: Rogene Houston, MD;  [facility];  Service: Endoscopy;;   ESOPHAGOGASTRODUODENOSCOPY (EGD) WITH PROPOFOL N/A 03/03/2021   Procedure: ESOPHAGOGASTRODUODENOSCOPY (EGD) WITH PROPOFOL;  Surgeon: Rogene Houston, MD;  [facility];  Service: Endoscopy;  Laterality: N/A;   EYE SURGERY     LUNG LOBECTOMY     SUBCLAVIAN ARTERY STENT     TUBAL LIGATION      Social History   Socioeconomic History   Marital status: Widowed    Spouse name: Not on file   Number of children: 2   Years of education: colllege education   Highest education level: Associate degree: occupational, Hotel manager, or vocational program  Occupational History   Occupation: retired Quarry manager    Comment: disabled   Tobacco Use   Smoking status: Former    Types: Cigarettes    Quit date: 01/17/1996    Years since quitting: 25.1   Smokeless tobacco: Never  Vaping Use   Vaping Use: Never used  Substance and Sexual Activity   Alcohol use: No    Alcohol/week: 0.0 standard drinks   Drug use: No   Sexual activity: Not on file  Other Topics Concern   Not on file  Social History Narrative   Not on file   Social Determinants of Health   Financial Resource Strain:  Not on file  Food Insecurity: Not on file  Transportation Needs: Not on file  Physical Activity: Not on file  Stress: Not on file  Social Connections: Not on file  Intimate Partner Violence: Not on file   Family History  Problem Relation Age of Onset   Lung cancer Sister    Macular degeneration Sister    Colon cancer Neg Hx       VITAL SIGNS BP 110/70    Pulse 74    Temp (!) 97.3 F (36.3 C)    Resp 18    Ht 5' (1.524 m)    Wt 86 lb (39 kg)    SpO2 94%    BMI 16.80 kg/m   Outpatient Encounter Medications as of 03/06/2021   Medication Sig Note   acetaminophen (TYLENOL) 160 MG/5ML solution Take 20 mLs (640 mg total) by mouth every 6 (six) hours as needed for fever, headache or mild pain.    [START ON 03/12/2021] aspirin 81 MG tablet Take 1 tablet (81 mg total) by mouth daily. Reported on 04/27/2015    DULoxetine (CYMBALTA) 30 MG capsule Take 30 mg by mouth daily.    feeding supplement (ENSURE ENLIVE / ENSURE PLUS) LIQD Take 237 mLs by mouth 2 (two) times daily between meals.    folic acid (FOLVITE) 1 MG tablet Take 1 tablet (1 mg total) by mouth daily.    isosorbide mononitrate (ISMO) 10 MG tablet Take 0.5 tablets (5 mg total) by mouth 3 (three) times daily before meals.    levothyroxine (SYNTHROID) 50 MCG tablet Take 50 mcg by mouth daily.    loperamide HCl (IMODIUM) 2 MG/15ML solution Take 15 mLs (2 mg total) by mouth as needed for diarrhea or loose stools.    metoprolol tartrate (LOPRESSOR) 25 MG tablet Take 0.5 tablets (12.5 mg total) by mouth 2 (two) times daily.    midodrine (PROAMATINE) 2.5 MG tablet Take 1 tablet (2.5 mg total) by mouth 3 (three) times daily with meals.    ondansetron (ZOFRAN-ODT) 4 MG disintegrating tablet Take 1 tablet (4 mg total) by mouth every 8 (eight) hours as needed for nausea or vomiting.    pantoprazole (PROTONIX) 40 MG tablet Take 40 mg by mouth daily. 10/04/2017: LF @ [facility] on 09-05-17 # 90   traZODone (DESYREL) 50 MG tablet Take 1 tablet (50 mg total) by mouth at bedtime.    [DISCONTINUED] atorvastatin (LIPITOR) 40 MG tablet Take 40 mg by mouth daily at 6 PM.  10/04/2017: LF @ [facility] on 09-05-17 #90 DS 90   [DISCONTINUED] Multiple Vitamin (MULTIVITAMIN WITH MINERALS) TABS tablet Take 1 tablet by mouth daily.    No facility-administered encounter medications on file as of 03/06/2021.     SIGNIFICANT DIAGNOSTIC EXAMS  TODAY  03-01-21 chest x-ray:  1. No active disease. 2. Aortic Atherosclerosis and Emphysema   03-01-21: left knee:  Frontal and lateral views of the left knee  are obtained. No acute fracture, subluxation, or dislocation. No joint effusion. Soft tissues are unremarkable. Diffuse vascular calcifications again noted.   03-01-21: ct of neck:  No foreign body is identified within the imaged portions of the aerodigestive tract. No appreciable swelling or discrete mass within the oral cavity,pharynx or larynx on this non-contrast examination. Punctate postinflammatory calcifications/tonsilliths within the palatine tonsils, bilaterally. Cervical spondylosis. Aortic Atherosclerosis  and Emphysema  Bilateral upper lobe pulmonary scarring.  03-01-21: ct of head: No acute intracranial abnormality.   03-02-21: DG esophagus 1. Substantial esophageal dysmotility disorder  with disruption of primary peristaltic waves and extensive tertiary and some secondary contractions. Corkscrew appearance of the esophagus compatible with presbyesophagus. 2. In the vicinity of the gastroesophageal junction, there is an appearance of 2-3 unusual outpouchings of variable size with possibilities including epiphrenic diverticula, or gastric diverticula associated with a hiatal hernia. On series 8, the possibility of an irregular 1.7 cm in diameter filling defect within this presumed hiatal hernia is raised, and malignancy is a possibility. Endoscopy is likely warranted. 3. There is also a filling defect along the luminal margins of the upper thoracic esophagus just above the aorta, but we have cross-sectional imaging of the neck from 03/01/2021 through this region which did not reveal a mass, and accordingly it is possible that this represents some stuck food in the esophagus forming the filling defect. This could also be assessed at endoscopy.  03-03-21: EGD:  - Normal hypopharynx. - Abnormal esophageal motility, suspicious for esophageal spasm. - Z-line regular, 28 cm from the incisors. - 6 cm hiatal hernia. - Gastric antral vascular ectasia without bleeding. - Normal duodenal bulb and  second portion of the duodenum. - No specimens collected   LABS REVIEWED;   03-01-21: wbc 5.3 hgb 11.4 hct 38.5; mcv 77.3 plt clump; glucose 93; bun 21; creat 1.56; k+ 4.1; na++ 128; ca 9.3 GFR 31; ast 43; albumin 4.3; mag 2.3; tsh 0.362 (repeat 0.237) urine culture: mixed bacteria  03-02-21 wbc 4.1; hgb 9.9; hct 33.2; mcv 75.3 plt 133; glucose 124; bun 17; creat 1.38; k+ 2.8; na++ 131; ca 8.4 GFR 36 free t4: 1.25; mag 1.9; vit B 12: 448 folate 5.8 iron 48; tibc 365 ferritin 48  03-04-21: wbc 2.8; hgb 9.0; hct 29,2 mcv 76.4 plt 114; glucose 75; bun 8; creat 0.97; k+ 4.0; na++ 134; ca 8.4 GFR 55; phos 2.8 albumin 2.6 cortisol AM 11.7 03-05-21: wbc 3.2; hgb 10.4; hct 33.4; mcv 77.1 plt 118; glucose 86; bun 6; creat 0.90; k+ 3.8; na++ 133; ca 8.9; GFR>60; phos 3.3; albumin 2.9; mag 2.2   Review of Systems  Constitutional:  Negative for malaise/fatigue.  Respiratory:  Negative for cough and shortness of breath.   Cardiovascular:  Negative for chest pain, palpitations and leg swelling.  Gastrointestinal:  Negative for abdominal pain, constipation and heartburn.  Musculoskeletal:  Negative for back pain, joint pain and myalgias.  Skin: Negative.   Neurological:  Negative for dizziness.  Psychiatric/Behavioral:  The patient is not nervous/anxious.      Physical Exam Constitutional:      General: She is not in acute distress.    Appearance: She is cachectic. She is not diaphoretic.  Neck:     Thyroid: No thyromegaly.  Cardiovascular:     Rate and Rhythm: Normal rate and regular rhythm.     Pulses: Normal pulses.     Heart sounds: Normal heart sounds.  Pulmonary:     Effort: Pulmonary effort is normal. No respiratory distress.     Breath sounds: Normal breath sounds.  Abdominal:     General: Bowel sounds are normal. There is no distension.     Palpations: Abdomen is soft.     Tenderness: There is no abdominal tenderness.  Musculoskeletal:        General: Normal range of motion.     Cervical  back: Neck supple.     Right lower leg: No edema.     Left lower leg: No edema.     Comments: Kyphosis mild   Lymphadenopathy:  Cervical: No cervical adenopathy.  Skin:    General: Skin is warm and dry.  Neurological:     Mental Status: She is alert and oriented to person, place, and time.  Psychiatric:        Mood and Affect: Mood normal.       ASSESSMENT/ PLAN:  TODAY  Esophageal dysmotility: is on pureed diet with thin liquids. Will continue protonix 40 mg daily; ismo 5 mg with meals  2. AKI (acute kidney injury) has resolved bun 6 creat 0.90; GFR >60  3. Protein calorie malnutrition severe: albumin 2.9 will continue supplements as directed  4. Folic acid deficiency: is 5.8 will continue folic acid 1 mg daily   5.  Chronic anemia: hgb 10.4 will monitor   6. Acquired hypothyroidism: is stable will continue will continue synthroid 50 mcg daily   7. Recurrent depression: will continue cymbalta 30 mg daily and trazodone 50 mg nightly   8. PVD (peripheral vascular disease) is stable will restart asa 81 mg daily on 03-12-21  9. Hypotension unspecified hypotension: will continue midodrine 2.5 mg three times daily her lopressor has been lowered to 12.5 mg twice daily will make further adjustments as needed  Will check cmp and cbc      Ok Edwards NP [facility]  call [phone]

## 2021-03-07 ENCOUNTER — Encounter: Payer: Self-pay | Admitting: Internal Medicine

## 2021-03-07 ENCOUNTER — Non-Acute Institutional Stay (SKILLED_NURSING_FACILITY): Payer: Medicare Other | Admitting: Internal Medicine

## 2021-03-07 DIAGNOSIS — K224 Dyskinesia of esophagus: Secondary | ICD-10-CM | POA: Diagnosis not present

## 2021-03-07 DIAGNOSIS — D649 Anemia, unspecified: Secondary | ICD-10-CM

## 2021-03-07 DIAGNOSIS — N179 Acute kidney failure, unspecified: Secondary | ICD-10-CM | POA: Diagnosis not present

## 2021-03-07 DIAGNOSIS — R627 Adult failure to thrive: Secondary | ICD-10-CM

## 2021-03-07 DIAGNOSIS — E43 Unspecified severe protein-calorie malnutrition: Secondary | ICD-10-CM | POA: Diagnosis not present

## 2021-03-07 NOTE — Progress Notes (Signed)
NURSING HOME LOCATION: [facility] ROOM NUMBER:  [id number]  CODE STATUS:  DNR/DNI  PCP:  Caryl Bis MD  This is a comprehensive admission note to this SNFperformed on this date less than 30 days from date of admission. Included are preadmission medical/surgical history; reconciled medication list; family history; social history and comprehensive review of systems.  Corrections and additions to the records were documented. Comprehensive physical exam was also performed. Additionally a clinical summary was entered for each active diagnosis pertinent to this admission in the Problem List to enhance continuity of care.  HPI: She was hospitalized 2/2 - 03/05/2021 for adult failure to thrive manifested as poor p.o. intake with documented weight loss, dysphagia, and lethargy.  The debilitation was associated with falling at home on 1/25. In the ED bradycardia was noted with heart rates in the 40s and 50s.  Sodium was 128; creatinine 1.56 and GFR 31 compatible with low stage IIIb kidney disease.   Esophagram documented substantial esophageal dysmotility disorder concerning for presbyesophagus, epiphrenic diverticula or gastric diverticuli, hiatal hernia and possible food impaction.  EGD 2/4 documented abnormal esophageal motility suspicious for esophageal spasm.  6 cm hiatal hernia and gastric antral vascular ectasia without bleeding were documented.  GI prescribed low-dose nitrate for esophageal dysmotility and recommended pured diet.  PPI work was continued.  With rehydration and intervention improved to 0.90 with a GFR greater than 60 indicating CKD stage II Hypoglycemia was noted with glucoses as low as 55.  Total protein was 5.5 and albumin 2.9 revealing protein/caloric malnutrition.   At admission hemoglobin was 11.4 and hematocrit 38.5 with microcytic/hypochromic indices.  Iron level was 38.  Folic acid slightly reduced; B12 level normal.  Nadir H/H was 9.2/31.1.Marland Kitchen  At discharge  values were 10.4/33.4 with microcytic, normochromic indices.  Platelet count was reduced to 118,000 and white count 3200 prior to discharge.  White blood count was 5300 at admission; nadir white blood count was 2800. Because of clinical debilitation; SNF placement was recommended.  Past medical and surgical history: Includes CAD with history of MI, GERD, dyslipidemia, essential hypertension, hypothyroidism, osteoporosis, psoriasis, PVD, and history of depression. Surgeries and procedures include abdominal hysterectomy, colonoscopy, coronary angioplasty with stent placement, craniotomy X2 in 2019, esophageal dilation, subclavian artery stenting, and lung lobectomy.  Social history: Nondrinker; former smoker.  Family history: Reviewed; noncontributory due to advanced age.   Review of systems: Clinical neurocognitive deficits made validity of responses questionable.  She gave the date as February 7 or 8, 2023; but she kept referring to her craniotomy surgeries as having been performed in 1919.  She relates weight loss to "all these injuries and COVID".  She states that her dysphagia is improved and validates that her esophagus was "expanded".  Constitutional: No fever  Eyes: No redness, discharge, pain, vision change ENT/mouth: No nasal congestion, purulent discharge, earache, change in hearing, sore throat  Cardiovascular: No chest pain, palpitations, paroxysmal nocturnal dyspnea, claudication, edema  Respiratory: No cough, sputum production, hemoptysis, DOE, significant snoring, apnea  Gastrointestinal: No heartburn, nausea and vomiting, melena, rectal bleeding Genitourinary: No dysuria, hematuria, pyuria, incontinence, nocturia Musculoskeletal: No joint stiffness, joint swelling, weakness, pain Dermatologic: No rash, pruritus, change in appearance of skin Neurologic: No dizziness, headache, syncope, seizures, numbness, tingling Psychiatric: No significant anxiety, depression, insomnia,  anorexia Endocrine: No change in hair/skin/nails, excessive thirst, excessive hunger, excessive urination  Hematologic/lymphatic: No significant bruising, lymphadenopathy, abnormal bleeding Allergy/immunology: No itchy/watery eyes, significant sneezing, urticaria, angioedema  Physical exam:  Pertinent or positive findings: She appears frankly cachectic.  The left lacrimal gland is prominent.  She is edentulous.  There is a vascular nevus over the lower right lip.  There is a brief grade 1/2 systolic murmur at the base.  S1 and S2 are increased.  Breath sounds are decreased.  Pedal pulses are decreased.  There is severe atrophy of the limbs.  Her ribs and spine are visible and she has marked interosseous wasting of the hands.  General appearance: no acute distress, increased work of breathing is present.   Lymphatic: No lymphadenopathy about the head, neck, axilla. Eyes: No conjunctival inflammation or lid edema is present. There is no scleral icterus. Ears:  External ear exam shows no significant lesions or deformities.   Nose:  External nasal examination shows no deformity or inflammation. Nasal mucosa are pink and moist without lesions, exudates Oral exam: Lips and gums are healthy appearing.There is no oropharyngeal erythema or exudate. Neck:  No thyromegaly, masses, tenderness noted.    Heart:  No gallop, click, rub.  Lungs: without wheezes, rhonchi, rales, rubs. Abdomen: Bowel sounds are normal.  Abdomen is soft and nontender with no organomegaly, hernias, masses. GU: Deferred  Extremities:  No cyanosis, clubbing, edema. Neurologic exam: Balance, Rhomberg, finger to nose testing could not be completed due to clinical state Skin: Warm & dry w/o tenting. No significant lesions or rash.  See clinical summary under each active problem in the Problem List with associated updated therapeutic plan

## 2021-03-08 ENCOUNTER — Encounter: Payer: Self-pay | Admitting: Internal Medicine

## 2021-03-08 NOTE — Assessment & Plan Note (Signed)
Speech therapy and nutrition will follow at SNF GI follow-up as scheduled.

## 2021-03-08 NOTE — Assessment & Plan Note (Addendum)
2/2 - 03/05/2021 admitted with adult failure to thrive.  Admission H/H 11.4/38.5 with microcytic/hypochromic indices.  Iron level 38.  Folic acid level slightly reduced; B12 level normal.  Nadir H/H 9.2/31.1.  Predischarge H/H 10.4/33.4 with microcytic, normochromic indices. Continue folic acid supplementation.

## 2021-03-08 NOTE — Assessment & Plan Note (Signed)
Current total protein 5.5 and albumin 2.9.  Frank cachexia present clinically. Nutrition consult at SNF.

## 2021-03-08 NOTE — Assessment & Plan Note (Signed)
Adult failure to thrive is actually multifactorial; obviously dysphagia with poor oral intake is major component.  She also has protein/caloric malnutrition and vasculopathy. Speech therapy, PT/OT, and nutrition team involvement at SNF.

## 2021-03-08 NOTE — Assessment & Plan Note (Signed)
No change indicated in present medication regimen unless there is progression of CKD.

## 2021-03-08 NOTE — Patient Instructions (Signed)
See assessment and plan under each diagnosis in the problem list and acutely for this visit 

## 2021-03-09 DIAGNOSIS — Z515 Encounter for palliative care: Secondary | ICD-10-CM | POA: Diagnosis not present

## 2021-03-09 DIAGNOSIS — E43 Unspecified severe protein-calorie malnutrition: Secondary | ICD-10-CM | POA: Diagnosis not present

## 2021-03-12 ENCOUNTER — Encounter (INDEPENDENT_AMBULATORY_CARE_PROVIDER_SITE_OTHER): Payer: Self-pay | Admitting: Gastroenterology

## 2021-03-12 ENCOUNTER — Ambulatory Visit (INDEPENDENT_AMBULATORY_CARE_PROVIDER_SITE_OTHER): Payer: Medicare Other | Admitting: Gastroenterology

## 2021-03-12 ENCOUNTER — Other Ambulatory Visit (HOSPITAL_COMMUNITY)
Admission: RE | Admit: 2021-03-12 | Discharge: 2021-03-12 | Disposition: A | Discharge status: Home / Self Care | Payer: Medicare Other | Source: Skilled Nursing Facility | Attending: Adult Health | Admitting: Adult Health

## 2021-03-12 VITALS — BP 117/60 | HR 50 | Ht 60.0 in

## 2021-03-12 DIAGNOSIS — D509 Iron deficiency anemia, unspecified: Secondary | ICD-10-CM

## 2021-03-12 DIAGNOSIS — D61818 Other pancytopenia: Secondary | ICD-10-CM | POA: Insufficient documentation

## 2021-03-12 DIAGNOSIS — E440 Moderate protein-calorie malnutrition: Secondary | ICD-10-CM

## 2021-03-12 DIAGNOSIS — R1319 Other dysphagia: Secondary | ICD-10-CM

## 2021-03-12 LAB — COMPREHENSIVE METABOLIC PANEL
ALT: 14 U/L (ref 0–44)
AST: 31 U/L (ref 15–41)
Albumin: 3.6 g/dL (ref 3.5–5.0)
Alkaline Phosphatase: 134 U/L — ABNORMAL HIGH (ref 38–126)
Anion gap: 10 (ref 5–15)
BUN: 19 mg/dL (ref 8–23)
CO2: 26 mmol/L (ref 22–32)
Calcium: 9.8 mg/dL (ref 8.9–10.3)
Chloride: 100 mmol/L (ref 98–111)
Creatinine, Ser: 0.81 mg/dL (ref 0.44–1.00)
GFR, Estimated: >60 mL/min (ref >60)
Glucose, Bld: 70 mg/dL (ref 70–99)
Potassium: 3.9 mmol/L (ref 3.5–5.1)
Sodium: 136 mmol/L (ref 135–145)
Total Bilirubin: 0.3 mg/dL (ref 0.3–1.2)
Total Protein: 7 g/dL (ref 6.5–8.1)

## 2021-03-12 LAB — CBC
HCT: 37.6 % (ref 36.0–46.0)
Hemoglobin: 11.4 g/dL — ABNORMAL LOW (ref 12.0–15.0)
MCH: 24.2 pg — ABNORMAL LOW (ref 26.0–34.0)
MCHC: 30.3 g/dL (ref 30.0–36.0)
MCV: 79.7 fL — ABNORMAL LOW (ref 80.0–100.0)
Platelets: 134 10*3/uL — ABNORMAL LOW (ref 150–400)
RBC: 4.72 MIL/uL (ref 3.87–5.11)
RDW: 21.9 % — ABNORMAL HIGH (ref 11.5–15.5)
WBC: 2.7 10*3/uL — ABNORMAL LOW (ref 4.0–10.5)
nRBC: 0 % (ref 0.0–0.2)

## 2021-03-12 NOTE — Progress Notes (Signed)
Referring Provider: Caryl Bis, MD Primary Care Physician:  Melissa Bis, MD Primary GI Physician: Rehman  Chief Complaint  Patient presents with   Hospitalization Follow-up    Pt arrives with son Melissa Bentley for a hospital follow up.    HPI:   Melissa Bentley is a 88 y.o. female with past medical history of CAD, depression, GERD, HLD, HTN, Hypothyroidism, MI Osteoporosis.  Patient presenting today for hospital follow up.  Patient presented to ED on 03/02/21 for failure to thrive, difficulty eating and drinking. CT soft tissue neck without contrast without acute findings. CT Head without acute findings. Barium swallow  with substantial esophageal dysmotility disorder, corkscrew appearance of esophagus compatible with presbyesophagus, 2-3 outpouchings near GE junction, hiatal hernia/malignancy could not be excluded, also a filling defect along the luminal margins of the upper thoracic esophagus just above the aorta. EGD recommended for further evaluation. This was completed on 2/4 with findings of abnormal esophageal motility, suspicious for esophageal spasm, 6cm hiatal hernia, GAVE w/o bleeding. Patient started on isosorbide mononitrate 5mg  BID which was then increased to 5mg  30 mins prior to meals. Patient reported improvement in swallowing, thereafter.  Patient also noted to have anemia, likely multifactorial (hgb 11.4, dropped to 9 during admission) with normal ferritin, Iron 38, saturation 10, TIBC 365 and folate deficiency with folate 5.8, B12 448, no overt GI bleeding. Last hgb 10.4 one week ago with MCV 77.1  Today, she states no further issue with her swallowing. She states that the nursing facility brings the same foods every day and she does not feel that the food is very appetizing so she does not eat very much of it.  She states that she feels somewhat hungry at times but never overly hungry. she is supposed to get ensure shakes twice a day, but sometimes does not get them when  she is supposed to, though does drink them when they are provided. She is doing PT currently for her wrist, otherwise, does not do much physical activity. She states that she likes home cooked food and does not really get that where she is at now. Patient's son who is present states that patient will typically take a few bites of food on her tray and then leave the rest. She also reports that she does not have teeth so this makes things difficult to eat. She has had two sets of false teeth but neither stayed in her mouth well so she stopped wearing them.  She denies any abdominal pain, states that she had some constipation and diarrhea a while back but BMs are more regular now without issue. She denies any rectal bleeding or melena.  She denies any SOB or dizziness. She reports some intermittent fatigue, though she states she is more fatigued today due to having blood drawn and appointments this morning. Feels that she is tolerating isosorbide without any issue. She tells me that she has never been a big person and does not feel that her current weight is much less than what she was at baseline. She would like to have some pizza or a burger, however, she is limited on what she can have at the nursing facility given her lack of teeth and worry of her choking because of this.  Last Colonoscopy:1/4/17Normal-appearing rectal mucosa. Densely populated colonic diverticula from the rectosigmoid junction all the way to the cecum. However, the colonic mucosa otherwise appeared normal. I attempted to intubate the terminal ileum but was unable to do so.Segmental biopsies  of the ascending and descending/sigmoid segments taken to evaluate for microscopic colitis. Also, a stool sample was submitted for GI pathogen panel. Retroflexion was performed. (Biopsies were unremarkable and stool studies negative)  Last Endoscopy:03/03/21 - Normal hypopharynx. - Abnormal esophageal motility, suspicious for esophageal spasm. - Z-line  regular, 28 cm from the incisors. - 6 cm hiatal hernia. - Gastric antral vascular ectasia without bleeding. - Normal duodenal bulb and second portion of the duodenum. - No specimens collected.  Recommendations:    Past Medical History:  Diagnosis Date   CAD (coronary artery disease)    Depression    GERD (gastroesophageal reflux disease)    Hyperlipidemia    Hypertension    Hypothyroidism    MI (myocardial infarction) ([place])    Osteoporosis    Psoriasis    PVD (peripheral vascular disease) ([place])     Past Surgical History:  Procedure Laterality Date   ABDOMINAL HYSTERECTOMY     CATARACT EXTRACTION Bilateral    COLONOSCOPY N/A 02/01/2015   Dr. Rourk:colonic diverticulosis s/p biopsy with unremarkable colonic mucosa, no microscopic colitis.    CORONARY ANGIOPLASTY WITH STENT PLACEMENT     CRANIOTOMY Right 10/03/2017   Procedure: CRANIOTOMY HEMATOMA EVACUATION SUBDURAL;  Surgeon: Ashok Pall, MD;  Location: [place];  Service: Neurosurgery;  Laterality: Right;   CRANIOTOMY Right 10/05/2017   Procedure: REDO CRANIOTOMY HEMATOMA EVACUATION SUBDURAL;  Surgeon: Ashok Pall, MD;  Location: [place];  Service: Neurosurgery;  Laterality: Right;   ESOPHAGEAL DILATION  03/03/2021   Procedure: ESOPHAGEAL DILATION;  Surgeon: Rogene Houston, MD;  [facility];  Service: Endoscopy;;   ESOPHAGOGASTRODUODENOSCOPY (EGD) WITH PROPOFOL N/A 03/03/2021   Procedure: ESOPHAGOGASTRODUODENOSCOPY (EGD) WITH PROPOFOL;  Surgeon: Rogene Houston, MD;  Location: [facility];  Service: Endoscopy;  Laterality: N/A;   EYE SURGERY     LUNG LOBECTOMY     SUBCLAVIAN ARTERY STENT     TUBAL LIGATION      No current outpatient medications on file.   No current facility-administered medications for this visit.    Allergies as of 03/12/2021   (No Known Allergies)    Family History  Problem Relation Age of Onset   Lung cancer Sister    Macular degeneration Sister    Colon cancer Neg Hx     Social  History   Socioeconomic History   Marital status: Widowed    Spouse name: Not on file   Number of children: 2   Years of education: colllege education   Highest education level: Associate degree: occupational, Hotel manager, or vocational program  Occupational History   Occupation: retired Quarry manager    Comment: disabled   Tobacco Use   Smoking status: Former    Types: Cigarettes    Quit date: 01/17/1996    Years since quitting: 25.1   Smokeless tobacco: Never  Vaping Use   Vaping Use: Never used  Substance and Sexual Activity   Alcohol use: No    Alcohol/week: 0.0 standard drinks   Drug use: No   Sexual activity: Not on file  Other Topics Concern   Not on file  Social History Narrative   Not on file   Social Determinants of Health   Financial Resource Strain: Not on file  Food Insecurity: Not on file  Transportation Needs: Not on file  Physical Activity: Not on file  Stress: Not on file  Social Connections: Not on file   Review of systems General: negative for malaise, night sweats, fever, chills, +malnutrition  Neck: Negative for lumps, goiter, pain and significant neck swelling Resp: Negative for cough, wheezing, dyspnea at rest CV: Negative for chest pain, leg swelling, palpitations, orthopnea GI: denies melena, hematochezia, nausea, vomiting, diarrhea, constipation, dysphagia, odyonophagia, early satiety. MSK: Negative for joint pain or swelling, back pain, and muscle pain. Derm: Negative for itching or rash Psych: Denies depression, anxiety, memory loss, confusion. No homicidal or suicidal ideation.  Heme: Negative for prolonged bleeding, bruising easily, and swollen nodes. Endocrine: Negative for cold or heat intolerance, polyuria, polydipsia and goiter. Neuro: negative for tremor, gait imbalance, syncope and seizures. The remainder of the review of systems is noncontributory.  Physical Exam: BP 117/60 (BP Location: Right Arm, Patient Position: Sitting, Cuff Size:  Normal)    Pulse (!) 50    Ht 5' (1.524 m)    BMI 16.80 kg/m  General:   Alert and oriented. No distress noted. Pleasant and cooperative.  Head:  Normocephalic and atraumatic. Eyes:  Conjuctiva clear without scleral icterus. Mouth:  Oral mucosa pink and moist. Good dentition. No lesions. Heart: Normal rate and rhythm, s1 and s2 heart sounds present.  Lungs: Clear lung sounds in all lobes. Respirations equal and unlabored. Abdomen:  +BS, soft, non-tender and non-distended. No rebound or guarding. No HSM or masses noted. Derm: No palmar erythema or jaundice Msk:  Symmetrical without gross deformities. Normal posture. Extremities:  Without edema. Neurologic:  Alert and  oriented x4 Psych:  Alert and cooperative. Normal mood and affect.  Invalid input(s): 6 MONTHS   ASSESSMENT: IRISH KUPKA is a 88 y.o. female presenting today for hospital follow up after she underwent EGD for dysphagia/failure to thrive and was started on isosorbide thereafter for concern of esophageal spasm.  Dysphagia is resolved since starting isosorbide 5mg  TID. She seems to be tolerating this without issue. We will continue this medication at current dose.  As far as patient's failure to thrive/protein-calorie malnutrition, I am recommending that protein shakes be increased to TID and that family be allowed to provide her with snacks/meals that she is able to eat/likes. At this time, the goal is to maintain current weight and prevent any further loss, ensuring adequate nutrition as much as possible.  Patient also noted to have chronic anemia, likely multifactorial and heavily influenced by inadequate iron in diet, with normal ferritin, Iron 38, saturation 10, TIBC 365 and folate deficiency with folate 5.8, B12 448.  Last hgb 10.4 one week ago with MCV 77.1 which appears to be around baseline. no overt GI bleeding. Given her age and other comorbidities, risks would likely outweigh benefits of colonoscopy at this time.  I discussed this with patient and patient's son, Melissa Bentley who are in agreement to hold off on pursing any further endoscopic evaluation of this. They will let me know if she develops any symptoms of worsening anemia or overt GI bleeding.   PLAN:  1.continue isosorbide 5mg  TID with meals 2. Increase protein shakes to TID 3. Patient should be provided snacks/meals that she is willing to eat, would be helpful if family could bring in stuff as she does not care for the facility's food 4.increase foods such as soft cheeses, avocado, soft protein bars, soft beans, nut butters and finely chopped or pureed lean meats such as [place] and chicken/ iron rich foods 5. Patient/family will make me aware of any rectal bleeding or black stools  Follow Up: 3 months  Kailash Hinze L. Alver Sorrow, MSN, APRN, AGNP-C Adult-Gerontology Nurse Practitioner [facility]

## 2021-03-12 NOTE — Patient Instructions (Signed)
I am requesting that nursing facility provide 3 protein shakes per day Please provide patient with snacks/meals that are appropriate and that are foods she likes/will eat. She should continue isosorbide 5mg  TID. Snacks such as avocado, soft cheeses, soft protein bars, soft beans, nut butters, soft meats such as finely chopped/pureed chicken are all good to help provide calories and protein. At this point I think it is important to allow patient to have whatever foods she wants/is able to eat. Please make me aware if she develops any rectal bleeding or black stools. Hemoglobin is still slightly low but remains stable.   Follow up 3 months

## 2021-03-20 ENCOUNTER — Non-Acute Institutional Stay (SKILLED_NURSING_FACILITY): Payer: Medicare Other | Admitting: Adult Health

## 2021-03-20 ENCOUNTER — Encounter: Payer: Self-pay | Admitting: Adult Health

## 2021-03-20 DIAGNOSIS — D61818 Other pancytopenia: Secondary | ICD-10-CM

## 2021-03-20 DIAGNOSIS — I739 Peripheral vascular disease, unspecified: Secondary | ICD-10-CM

## 2021-03-20 DIAGNOSIS — E43 Unspecified severe protein-calorie malnutrition: Secondary | ICD-10-CM | POA: Diagnosis not present

## 2021-03-20 DIAGNOSIS — I6203 Nontraumatic chronic subdural hemorrhage: Secondary | ICD-10-CM | POA: Diagnosis not present

## 2021-03-20 NOTE — Progress Notes (Signed)
Location:  [place] Room Number: 126-P Place of Service:  SNF (31)   CODE STATUS: DNR  No Known Allergies  Chief Complaint  Patient presents with   Discharge Note    Discharge from [facility]     HPI:  She is being discharged to assisted living. She will need a wheelchair. She will need pt/ot home health. She will need to follow up with her primary care provider. Her medications will be provided by the receivng facility. She had been hospitalized weakness; failure to thrive. She was admitted to this facility for short term rehab.she has participated in pt/ot to improve upon her level of independence. She is now ready to return back to her assisted living.   Past Medical History:  Diagnosis Date   CAD (coronary artery disease)    Depression    GERD (gastroesophageal reflux disease)    Hyperlipidemia    Hypertension    Hypothyroidism    MI (myocardial infarction) ([place])    Osteoporosis    Psoriasis    PVD (peripheral vascular disease) ([place])     Past Surgical History:  Procedure Laterality Date   ABDOMINAL HYSTERECTOMY     CATARACT EXTRACTION Bilateral    COLONOSCOPY N/A 02/01/2015   Dr. Rourk:colonic diverticulosis s/p biopsy with unremarkable colonic mucosa, no microscopic colitis.    CORONARY ANGIOPLASTY WITH STENT PLACEMENT     CRANIOTOMY Right 10/03/2017   Procedure: CRANIOTOMY HEMATOMA EVACUATION SUBDURAL;  Surgeon: Ashok Pall, MD;  Location: [place];  Service: Neurosurgery;  Laterality: Right;   CRANIOTOMY Right 10/05/2017   Procedure: REDO CRANIOTOMY HEMATOMA EVACUATION SUBDURAL;  Surgeon: Ashok Pall, MD;  Location: [facility];  Service: Neurosurgery;  Laterality: Right;   ESOPHAGEAL DILATION  03/03/2021   Procedure: ESOPHAGEAL DILATION;  Surgeon: Rogene Houston, MD;  [facility];  Service: Endoscopy;;   ESOPHAGOGASTRODUODENOSCOPY (EGD) WITH PROPOFOL N/A 03/03/2021   Procedure: ESOPHAGOGASTRODUODENOSCOPY (EGD) WITH PROPOFOL;   Surgeon: Rogene Houston, MD;  [facility];  Service: Endoscopy;  Laterality: N/A;   EYE SURGERY     LUNG LOBECTOMY     SUBCLAVIAN ARTERY STENT     TUBAL LIGATION      Social History   Socioeconomic History   Marital status: Widowed    Spouse name: Not on file   Number of children: 2   Years of education: colllege education   Highest education level: Associate degree: occupational, Hotel manager, or vocational program  Occupational History   Occupation: retired Quarry manager    Comment: disabled   Tobacco Use   Smoking status: Former    Types: Cigarettes    Quit date: 01/17/1996    Years since quitting: 25.1   Smokeless tobacco: Never  Vaping Use   Vaping Use: Never used  Substance and Sexual Activity   Alcohol use: No    Alcohol/week: 0.0 standard drinks   Drug use: No   Sexual activity: Not on file  Other Topics Concern   Not on file  Social History Narrative   Not on file   Social Determinants of Health   Financial Resource Strain: Not on file  Food Insecurity: Not on file  Transportation Needs: Not on file  Physical Activity: Not on file  Stress: Not on file  Social Connections: Not on file  Intimate Partner Violence: Not on file   Family History  Problem Relation Age of Onset   Lung cancer Sister    Macular degeneration Sister  Colon cancer Neg Hx       VITAL SIGNS Temp (!) 97.1 F (36.2 C)    Resp 20    SpO2 96%   Outpatient Encounter Medications as of 03/20/2021  Medication Sig Note   acetaminophen (TYLENOL) 160 MG/5ML solution Take 20 mLs (640 mg total) by mouth every 6 (six) hours as needed for fever, headache or mild pain.    Amino Acids-Protein Hydrolys (FEEDING SUPPLEMENT, PRO-STAT SUGAR FREE 64,) LIQD Take 30 mLs by mouth 2 (two) times daily.    aspirin 81 MG tablet Take 1 tablet (81 mg total) by mouth daily. Reported on 04/27/2015    Balsam [place]-Castor Oil [facility]) OINT Apply 1 application topically as directed. Apply to coccyx,  sacrum and bilateral buttocks every shift.    DULoxetine (CYMBALTA) 30 MG capsule Take 30 mg by mouth daily.    folic acid (FOLVITE) 1 MG tablet Take 1 tablet (1 mg total) by mouth daily.    isosorbide mononitrate (ISMO) 10 MG tablet Take 0.5 tablets (5 mg total) by mouth 3 (three) times daily before meals.    levothyroxine (SYNTHROID) 50 MCG tablet Take 50 mcg by mouth daily.    loperamide HCl (IMODIUM) 2 MG/15ML solution Take 15 mLs (2 mg total) by mouth as needed for diarrhea or loose stools.    metoprolol tartrate (LOPRESSOR) 25 MG tablet Take 0.5 tablets (12.5 mg total) by mouth 2 (two) times daily.    midodrine (PROAMATINE) 2.5 MG tablet Take 1 tablet (2.5 mg total) by mouth 3 (three) times daily with meals.    ondansetron (ZOFRAN-ODT) 4 MG disintegrating tablet Take 1 tablet (4 mg total) by mouth every 8 (eight) hours as needed for nausea or vomiting.    pantoprazole (PROTONIX) 40 MG tablet Take 40 mg by mouth daily. 10/04/2017: LF @ [facility] on 09-05-17 # 90   traZODone (DESYREL) 50 MG tablet Take 1 tablet (50 mg total) by mouth at bedtime.    [DISCONTINUED] atorvastatin (LIPITOR) 40 MG tablet Take 40 mg by mouth daily.    [DISCONTINUED] feeding supplement (ENSURE ENLIVE / ENSURE PLUS) LIQD Take 237 mLs by mouth 2 (two) times daily between meals.    [DISCONTINUED] Multiple Vitamin (MULTIVITAMIN) tablet Take 1 tablet by mouth daily.    No facility-administered encounter medications on file as of 03/20/2021.     SIGNIFICANT DIAGNOSTIC EXAMS  PREVIOUS   03-01-21 chest x-ray:  1. No active disease. 2. Aortic Atherosclerosis and Emphysema   03-01-21: left knee:  Frontal and lateral views of the left knee are obtained. No acute fracture, subluxation, or dislocation. No joint effusion. Soft tissues are unremarkable. Diffuse vascular calcifications again noted.   03-01-21: ct of neck:  No foreign body is identified within the imaged portions of the aerodigestive tract. No appreciable swelling  or discrete mass within the oral cavity,pharynx or larynx on this non-contrast examination. Punctate postinflammatory calcifications/tonsilliths within the palatine tonsils, bilaterally. Cervical spondylosis. Aortic Atherosclerosis  and Emphysema  Bilateral upper lobe pulmonary scarring.  03-01-21: ct of head: No acute intracranial abnormality.   03-02-21: DG esophagus 1. Substantial esophageal dysmotility disorder with disruption of primary peristaltic waves and extensive tertiary and some secondary contractions. Corkscrew appearance of the esophagus compatible with presbyesophagus. 2. In the vicinity of the gastroesophageal junction, there is an appearance of 2-3 unusual outpouchings of variable size with possibilities including epiphrenic diverticula, or gastric diverticula associated with a hiatal hernia. On series 8, the possibility of an irregular 1.7 cm in diameter filling  defect within this presumed hiatal hernia is raised, and malignancy is a possibility. Endoscopy is likely warranted. 3. There is also a filling defect along the luminal margins of the upper thoracic esophagus just above the aorta, but we have cross-sectional imaging of the neck from 03/01/2021 through this region which did not reveal a mass, and accordingly it is possible that this represents some stuck food in the esophagus forming the filling defect. This could also be assessed at endoscopy.  03-03-21: EGD:  - Normal hypopharynx. - Abnormal esophageal motility, suspicious for esophageal spasm. - Z-line regular, 28 cm from the incisors. - 6 cm hiatal hernia. - Gastric antral vascular ectasia without bleeding. - Normal duodenal bulb and second portion of the duodenum. - No specimens collected  NO NEW EXAMS.    LABS REVIEWED; PREVIOUS   03-01-21: wbc 5.3 hgb 11.4 hct 38.5; mcv 77.3 plt clump; glucose 93; bun 21; creat 1.56; k+ 4.1; na++ 128; ca 9.3 GFR 31; ast 43; albumin 4.3; mag 2.3; tsh 0.362 (repeat 0.237) urine  culture: mixed bacteria  03-02-21 wbc 4.1; hgb 9.9; hct 33.2; mcv 75.3 plt 133; glucose 124; bun 17; creat 1.38; k+ 2.8; na++ 131; ca 8.4 GFR 36 free t4: 1.25; mag 1.9; vit B 12: 448 folate 5.8 iron 48; tibc 365 ferritin 48  03-04-21: wbc 2.8; hgb 9.0; hct 29,2 mcv 76.4 plt 114; glucose 75; bun 8; creat 0.97; k+ 4.0; na++ 134; ca 8.4 GFR 55; phos 2.8 albumin 2.6 cortisol AM 11.7 03-05-21: wbc 3.2; hgb 10.4; hct 33.4; mcv 77.1 plt 118; glucose 86; bun 6; creat 0.90; k+ 3.8; na++ 133; ca 8.9; GFR>60; phos 3.3; albumin 2.9; mag 2.2   NO NEW LABS.   Review of Systems  Constitutional:  Negative for malaise/fatigue.  Respiratory:  Negative for cough and shortness of breath.   Cardiovascular:  Negative for chest pain, palpitations and leg swelling.  Gastrointestinal:  Negative for abdominal pain, constipation and heartburn.  Musculoskeletal:  Negative for back pain, joint pain and myalgias.  Skin: Negative.   Neurological:  Negative for dizziness.  Psychiatric/Behavioral:  The patient is not nervous/anxious.    Physical Exam Constitutional:      General: She is not in acute distress.    Appearance: She is cachectic. She is not diaphoretic.  Neck:     Thyroid: No thyromegaly.  Cardiovascular:     Rate and Rhythm: Normal rate and regular rhythm.     Pulses: Normal pulses.     Heart sounds: Normal heart sounds.  Pulmonary:     Effort: Pulmonary effort is normal. No respiratory distress.     Breath sounds: Normal breath sounds.  Abdominal:     General: Bowel sounds are normal. There is no distension.     Palpations: Abdomen is soft.     Tenderness: There is no abdominal tenderness.  Musculoskeletal:        General: Normal range of motion.     Cervical back: Neck supple.     Right lower leg: No edema.     Left lower leg: No edema.     Comments: Mild kyphosis   Lymphadenopathy:     Cervical: No cervical adenopathy.  Skin:    General: Skin is warm and dry.  Neurological:     Mental Status:  She is alert and oriented to person, place, and time.  Psychiatric:        Mood and Affect: Mood normal.      ASSESSMENT/ PLAN:  Patient is being discharged with the following home health services:  pt/ot to evaluate and treat as indicated for gait balance strength adl training.   Patient is being discharged with the following durable medical equipment:  needs a wheelchair for her to maintain her current level of independence with her adls.   Patient has been advised to f/u with their PCP in 1-2 weeks to for a transitions of care visit.  Social services at their facility was responsible for arranging this appointment.  Pt was provided with adequate prescriptions of noncontrolled medications to reach the scheduled appointment .  For controlled substances, a limited supply was provided as appropriate for the individual patient.  If the pt normally receives these medications from a pain clinic or has a contract with another physician, these medications should be received from that clinic or physician only).    Assisted living will provide all needed medications.   Time spent with patient: 20 minutes: home health; medications; dme.   Ok Edwards NP [facility]   call [phone]

## 2021-03-21 DIAGNOSIS — M6281 Muscle weakness (generalized): Secondary | ICD-10-CM | POA: Diagnosis not present

## 2021-03-21 DIAGNOSIS — M810 Age-related osteoporosis without current pathological fracture: Secondary | ICD-10-CM | POA: Diagnosis not present

## 2021-03-21 DIAGNOSIS — I2510 Atherosclerotic heart disease of native coronary artery without angina pectoris: Secondary | ICD-10-CM | POA: Diagnosis not present

## 2021-04-03 DIAGNOSIS — E46 Unspecified protein-calorie malnutrition: Secondary | ICD-10-CM | POA: Diagnosis not present

## 2021-04-03 DIAGNOSIS — S37009D Unspecified injury of unspecified kidney, subsequent encounter: Secondary | ICD-10-CM | POA: Diagnosis not present

## 2021-04-03 DIAGNOSIS — N390 Urinary tract infection, site not specified: Secondary | ICD-10-CM | POA: Diagnosis not present

## 2021-04-03 DIAGNOSIS — M6281 Muscle weakness (generalized): Secondary | ICD-10-CM | POA: Diagnosis not present

## 2021-04-03 DIAGNOSIS — D529 Folate deficiency anemia, unspecified: Secondary | ICD-10-CM | POA: Diagnosis not present

## 2021-04-03 DIAGNOSIS — D638 Anemia in other chronic diseases classified elsewhere: Secondary | ICD-10-CM | POA: Diagnosis not present

## 2021-04-10 DIAGNOSIS — E46 Unspecified protein-calorie malnutrition: Secondary | ICD-10-CM | POA: Diagnosis not present

## 2021-04-10 DIAGNOSIS — S37009D Unspecified injury of unspecified kidney, subsequent encounter: Secondary | ICD-10-CM | POA: Diagnosis not present

## 2021-04-10 DIAGNOSIS — D529 Folate deficiency anemia, unspecified: Secondary | ICD-10-CM | POA: Diagnosis not present

## 2021-04-10 DIAGNOSIS — M6281 Muscle weakness (generalized): Secondary | ICD-10-CM | POA: Diagnosis not present

## 2021-04-10 DIAGNOSIS — D638 Anemia in other chronic diseases classified elsewhere: Secondary | ICD-10-CM | POA: Diagnosis not present

## 2021-04-11 DIAGNOSIS — E46 Unspecified protein-calorie malnutrition: Secondary | ICD-10-CM | POA: Diagnosis not present

## 2021-04-11 DIAGNOSIS — M6281 Muscle weakness (generalized): Secondary | ICD-10-CM | POA: Diagnosis not present

## 2021-04-11 DIAGNOSIS — D638 Anemia in other chronic diseases classified elsewhere: Secondary | ICD-10-CM | POA: Diagnosis not present

## 2021-04-12 DIAGNOSIS — D529 Folate deficiency anemia, unspecified: Secondary | ICD-10-CM | POA: Diagnosis not present

## 2021-04-12 DIAGNOSIS — M6281 Muscle weakness (generalized): Secondary | ICD-10-CM | POA: Diagnosis not present

## 2021-04-12 DIAGNOSIS — S37009D Unspecified injury of unspecified kidney, subsequent encounter: Secondary | ICD-10-CM | POA: Diagnosis not present

## 2021-04-12 DIAGNOSIS — D638 Anemia in other chronic diseases classified elsewhere: Secondary | ICD-10-CM | POA: Diagnosis not present

## 2021-04-12 DIAGNOSIS — E46 Unspecified protein-calorie malnutrition: Secondary | ICD-10-CM | POA: Diagnosis not present

## 2021-04-13 DIAGNOSIS — E43 Unspecified severe protein-calorie malnutrition: Secondary | ICD-10-CM | POA: Diagnosis not present

## 2021-04-13 DIAGNOSIS — Z515 Encounter for palliative care: Secondary | ICD-10-CM | POA: Diagnosis not present

## 2021-04-17 DIAGNOSIS — D529 Folate deficiency anemia, unspecified: Secondary | ICD-10-CM | POA: Diagnosis not present

## 2021-04-17 DIAGNOSIS — S37009D Unspecified injury of unspecified kidney, subsequent encounter: Secondary | ICD-10-CM | POA: Diagnosis not present

## 2021-04-17 DIAGNOSIS — D638 Anemia in other chronic diseases classified elsewhere: Secondary | ICD-10-CM | POA: Diagnosis not present

## 2021-04-17 DIAGNOSIS — E46 Unspecified protein-calorie malnutrition: Secondary | ICD-10-CM | POA: Diagnosis not present

## 2021-04-17 DIAGNOSIS — M6281 Muscle weakness (generalized): Secondary | ICD-10-CM | POA: Diagnosis not present

## 2021-04-18 DIAGNOSIS — I2510 Atherosclerotic heart disease of native coronary artery without angina pectoris: Secondary | ICD-10-CM | POA: Diagnosis not present

## 2021-04-18 DIAGNOSIS — M810 Age-related osteoporosis without current pathological fracture: Secondary | ICD-10-CM | POA: Diagnosis not present

## 2021-04-18 DIAGNOSIS — M6281 Muscle weakness (generalized): Secondary | ICD-10-CM | POA: Diagnosis not present

## 2021-04-19 DIAGNOSIS — S37009D Unspecified injury of unspecified kidney, subsequent encounter: Secondary | ICD-10-CM | POA: Diagnosis not present

## 2021-04-19 DIAGNOSIS — D638 Anemia in other chronic diseases classified elsewhere: Secondary | ICD-10-CM | POA: Diagnosis not present

## 2021-04-19 DIAGNOSIS — E46 Unspecified protein-calorie malnutrition: Secondary | ICD-10-CM | POA: Diagnosis not present

## 2021-04-19 DIAGNOSIS — D529 Folate deficiency anemia, unspecified: Secondary | ICD-10-CM | POA: Diagnosis not present

## 2021-04-19 DIAGNOSIS — M6281 Muscle weakness (generalized): Secondary | ICD-10-CM | POA: Diagnosis not present

## 2021-04-20 ENCOUNTER — Emergency Department (HOSPITAL_COMMUNITY): Payer: Medicare Other

## 2021-04-20 ENCOUNTER — Encounter (HOSPITAL_COMMUNITY): Payer: Self-pay | Admitting: Emergency Medicine

## 2021-04-20 ENCOUNTER — Emergency Department (HOSPITAL_COMMUNITY)
Admission: EM | Admit: 2021-04-20 | Discharge: 2021-04-20 | Disposition: A | Discharge status: Skilled Nursing Facility | Payer: Medicare Other | Attending: Emergency Medicine | Admitting: Emergency Medicine

## 2021-04-20 ENCOUNTER — Other Ambulatory Visit: Payer: Self-pay

## 2021-04-20 DIAGNOSIS — S80919A Unspecified superficial injury of unspecified knee, initial encounter: Secondary | ICD-10-CM | POA: Diagnosis not present

## 2021-04-20 DIAGNOSIS — R102 Pelvic and perineal pain: Secondary | ICD-10-CM | POA: Diagnosis not present

## 2021-04-20 DIAGNOSIS — Z79899 Other long term (current) drug therapy: Secondary | ICD-10-CM | POA: Insufficient documentation

## 2021-04-20 DIAGNOSIS — W19XXXA Unspecified fall, initial encounter: Secondary | ICD-10-CM | POA: Insufficient documentation

## 2021-04-20 DIAGNOSIS — Z043 Encounter for examination and observation following other accident: Secondary | ICD-10-CM | POA: Diagnosis not present

## 2021-04-20 DIAGNOSIS — R6889 Other general symptoms and signs: Secondary | ICD-10-CM | POA: Diagnosis not present

## 2021-04-20 DIAGNOSIS — D649 Anemia, unspecified: Secondary | ICD-10-CM | POA: Insufficient documentation

## 2021-04-20 DIAGNOSIS — R519 Headache, unspecified: Secondary | ICD-10-CM | POA: Diagnosis not present

## 2021-04-20 DIAGNOSIS — Z7982 Long term (current) use of aspirin: Secondary | ICD-10-CM | POA: Diagnosis not present

## 2021-04-20 DIAGNOSIS — I499 Cardiac arrhythmia, unspecified: Secondary | ICD-10-CM | POA: Diagnosis not present

## 2021-04-20 DIAGNOSIS — Z743 Need for continuous supervision: Secondary | ICD-10-CM | POA: Diagnosis not present

## 2021-04-20 LAB — URINALYSIS, ROUTINE W REFLEX MICROSCOPIC
Bilirubin Urine: NEGATIVE
Glucose, UA: NEGATIVE mg/dL
Hgb urine dipstick: NEGATIVE
Ketones, ur: NEGATIVE mg/dL
Leukocytes,Ua: NEGATIVE
Nitrite: NEGATIVE
Protein, ur: NEGATIVE mg/dL
Specific Gravity, Urine: 1.013 (ref 1.005–1.030)
pH: 8 (ref 5.0–8.0)

## 2021-04-20 LAB — COMPREHENSIVE METABOLIC PANEL
ALT: 14 U/L (ref 0–44)
AST: 24 U/L (ref 15–41)
Albumin: 3.6 g/dL (ref 3.5–5.0)
Alkaline Phosphatase: 62 U/L (ref 38–126)
Anion gap: 8 (ref 5–15)
BUN: 23 mg/dL (ref 8–23)
CO2: 27 mmol/L (ref 22–32)
Calcium: 9.3 mg/dL (ref 8.9–10.3)
Chloride: 101 mmol/L (ref 98–111)
Creatinine, Ser: 0.83 mg/dL (ref 0.44–1.00)
GFR, Estimated: >60 mL/min (ref >60)
Glucose, Bld: 88 mg/dL (ref 70–99)
Potassium: 4.6 mmol/L (ref 3.5–5.1)
Sodium: 136 mmol/L (ref 135–145)
Total Bilirubin: 0.4 mg/dL (ref 0.3–1.2)
Total Protein: 6.3 g/dL — ABNORMAL LOW (ref 6.5–8.1)

## 2021-04-20 LAB — CBC WITH DIFFERENTIAL/PLATELET
Abs Immature Granulocytes: 0.01 10*3/uL (ref 0.00–0.07)
Basophils Absolute: 0 10*3/uL (ref 0.0–0.1)
Basophils Relative: 1 %
Eosinophils Absolute: 0 10*3/uL (ref 0.0–0.5)
Eosinophils Relative: 1 %
HCT: 32.3 % — ABNORMAL LOW (ref 36.0–46.0)
Hemoglobin: 9.7 g/dL — ABNORMAL LOW (ref 12.0–15.0)
Immature Granulocytes: 0 %
Lymphocytes Relative: 38 %
Lymphs Abs: 1.4 10*3/uL (ref 0.7–4.0)
MCH: 24.6 pg — ABNORMAL LOW (ref 26.0–34.0)
MCHC: 30 g/dL (ref 30.0–36.0)
MCV: 81.8 fL (ref 80.0–100.0)
Monocytes Absolute: 0.6 10*3/uL (ref 0.1–1.0)
Monocytes Relative: 15 %
Neutro Abs: 1.7 10*3/uL (ref 1.7–7.7)
Neutrophils Relative %: 45 %
Platelets: 166 10*3/uL (ref 150–400)
RBC: 3.95 MIL/uL (ref 3.87–5.11)
RDW: 17.7 % — ABNORMAL HIGH (ref 11.5–15.5)
WBC: 3.6 10*3/uL — ABNORMAL LOW (ref 4.0–10.5)
nRBC: 0 % (ref 0.0–0.2)

## 2021-04-20 NOTE — ED Notes (Signed)
Patient assisted to restroom via wheelchair. Patient brief changed. ?

## 2021-04-20 NOTE — ED Provider Notes (Signed)
?[place] ?Provider Note ? ? ?CSN: [id number] ?Arrival date & time: 04/20/21  0207 ? ?  ? ?History ? ?Chief Complaint  ?Patient presents with  ? Fall  ? ? ?RHODES BORUNDA is a 88 y.o. female. ? ?Unwitnessed fall. Found at facility on floor this morning. Unknown down time. Patient without specific complaint.  ? ? ?Fall ? ? ?  ? ?Home Medications ?Prior to Admission medications   ?Medication Sig Start Date End Date Taking? Authorizing Provider  ?acetaminophen (TYLENOL) 160 MG/5ML solution Take 20 mLs (640 mg total) by mouth every 6 (six) hours as needed for fever, headache or mild pain. 03/05/21   Mercy Riding, MD  ?Amino Acids-Protein Hydrolys (FEEDING SUPPLEMENT, PRO-STAT SUGAR FREE 64,) LIQD Take 30 mLs by mouth 2 (two) times daily.    [provider]  ?aspirin 81 MG tablet Take 1 tablet (81 mg total) by mouth daily. Reported on 04/27/2015 03/12/21   Mercy Riding, MD  ?Jan[place] Lab Oil [facility]) OINT Apply 1 application topically as directed. Apply to coccyx, sacrum and bilateral buttocks every shift.    [provider]  ?DULoxetine (CYMBALTA) 30 MG capsule Take 30 mg by mouth daily. 02/13/21   [provider]  ?folic acid (FOLVITE) 1 MG tablet Take 1 tablet (1 mg total) by mouth daily. 03/06/21   Mercy Riding, MD  ?isosorbide mononitrate (ISMO) 10 MG tablet Take 0.5 tablets (5 mg total) by mouth 3 (three) times daily before meals. 03/05/21   Mercy Riding, MD  ?levothyroxine (SYNTHROID) 50 MCG tablet Take 50 mcg by mouth daily. 07/01/18   [provider]  ?loperamide HCl (IMODIUM) 2 MG/15ML solution Take 15 mLs (2 mg total) by mouth as needed for diarrhea or loose stools. 03/05/21   Mercy Riding, MD  ?metoprolol tartrate (LOPRESSOR) 25 MG tablet Take 0.5 tablets (12.5 mg total) by mouth 2 (two) times daily. 03/05/21   Mercy Riding, MD  ?midodrine (PROAMATINE) 2.5 MG tablet Take 1 tablet (2.5 mg total) by mouth 3 (three) times daily with meals. 03/05/21    Mercy Riding, MD  ?ondansetron (ZOFRAN-ODT) 4 MG disintegrating tablet Take 1 tablet (4 mg total) by mouth every 8 (eight) hours as needed for nausea or vomiting. 03/05/21   Mercy Riding, MD  ?pantoprazole (PROTONIX) 40 MG tablet Take 40 mg by mouth daily.    [provider]  ?traZODone (DESYREL) 50 MG tablet Take 1 tablet (50 mg total) by mouth at bedtime. 03/05/21   Mercy Riding, MD  ?   ? ?Allergies    ?Patient has no known allergies.   ? ?Review of Systems   ?Review of Systems ? ?Physical Exam ?Updated Vital Signs ?BP (!) 171/65 (BP Location: Left Arm)   Pulse (!) 48   Temp 97.9 ?F (36.6 ?C)   Resp 19   Ht 5' (1.524 m)   Wt 40 kg   SpO2 99%   BMI 17.22 kg/m?  ?Physical Exam ?Vitals and nursing note reviewed.  ?Constitutional:   ?   Appearance: She is well-developed.  ?HENT:  ?   Head: Normocephalic and atraumatic.  ?   Mouth/Throat:  ?   Mouth: Mucous membranes are moist.  ?   Pharynx: Oropharynx is clear.  ?Eyes:  ?   Pupils: Pupils are equal, round, and reactive to light.  ?Cardiovascular:  ?   Rate and Rhythm: Normal rate and regular rhythm.  ?Pulmonary:  ?   Effort:  No respiratory distress.  ?   Breath sounds: No stridor.  ?Abdominal:  ?   General: Abdomen is flat. There is no distension.  ?Musculoskeletal:     ?   General: Tenderness (mild pelvic ttp) present. Normal range of motion.  ?   Cervical back: Normal range of motion.  ?Skin: ?   General: Skin is warm and dry.  ?Neurological:  ?   General: No focal deficit present.  ?   Mental Status: She is alert.  ? ? ?ED Results / Procedures / Treatments   ?Labs ?(all labs ordered are listed, but only abnormal results are displayed) ?Labs Reviewed  ?CBC WITH DIFFERENTIAL/PLATELET - Abnormal; Notable for the following components:  ?    Result Value  ? WBC 3.6 (*)   ? Hemoglobin 9.7 (*)   ? HCT 32.3 (*)   ? MCH 24.6 (*)   ? RDW 17.7 (*)   ? All other components within normal limits  ?COMPREHENSIVE METABOLIC PANEL - Abnormal; Notable for the  following components:  ? Total Protein 6.3 (*)   ? All other components within normal limits  ?URINALYSIS, ROUTINE W REFLEX MICROSCOPIC - Abnormal; Notable for the following components:  ? Color, Urine STRAW (*)   ? All other components within normal limits  ? ? ?EKG ?None ? ?Radiology ?DG Pelvis 1-2 Views ? ?Result Date: 04/20/2021 ?CLINICAL DATA:  Recent fall with pelvic pain, initial encounter EXAM: PELVIS - 1 VIEW COMPARISON:  02/21/2021 CT FINDINGS: Pelvic ring is intact. Healing superior and inferior pubic rami fractures are noted on the left. No proximal femoral fracture is seen. Vascular calcifications are noted. IMPRESSION: Healing superior and inferior pubic rami fractures on the left similar to that seen on prior CT examination. Electronically Signed   By: Inez Catalina M.D.   On: 04/20/2021 04:05  ? ?CT Head Wo Contrast ? ?Result Date: 04/20/2021 ?CLINICAL DATA:  Recent fall with headaches, initial encounter EXAM: CT HEAD WITHOUT CONTRAST TECHNIQUE: Contiguous axial images were obtained from the base of the skull through the vertex without intravenous contrast. RADIATION DOSE REDUCTION: This exam was performed according to the departmental dose-optimization program which includes automated exposure control, adjustment of the mA and/or kV according to patient size and/or use of iterative reconstruction technique. COMPARISON:  03/01/2021 FINDINGS: Brain: Chronic atrophic changes and chronic white matter ischemic changes are noted. Encephalomalacia changes are noted in area of prior craniotomy on the right. No findings to suggest acute hemorrhage, acute infarction or space-occupying mass lesion are noted. Vascular: No hyperdense vessel or unexpected calcification. Skull: Prior right craniotomy is again identified. Stable area of sclerosis in left sphenoid wing is noted. Sinuses/Orbits: No acute finding. Other: None. IMPRESSION: Chronic atrophic and ischemic changes. Postoperative changes in the right  temporoparietal region No acute abnormality noted. Electronically Signed   By: Inez Catalina M.D.   On: 04/20/2021 04:10   ? ?Procedures ?Procedures  ? ? ?Medications Ordered in ED ?Medications - No data to display ? ?ED Course/ Medical Decision Making/ A&P ?Clinical Course as of 04/20/21 0627  ?Fri Apr 20, 2021  ?0544 Hemoglobin(!): 9.7 [JM]  ?0544 CBC with Differential(!) [JM]  ?  ?Clinical Course User Index ?[JM] Marvella Jenning, Corene Cornea, MD  ? ?                        ?Medical Decision Making ?Amount and/or Complexity of Data Reviewed ?Labs: ordered. Decision-making details documented in ED Course. ?Radiology: ordered. ? ? ?  Unclear on type of fall, will get ct/cxr, ecg, UA/labs. Likely discharge back to facility if reassuring.  ? ?Workup ok. Hemoglobin near baseline. Stable for discharge to facility.  ? ?Final Clinical Impression(s) / ED Diagnoses ?Final diagnoses:  ?Fall, initial encounter  ?Anemia, unspecified type  ? ? ?Rx / DC Orders ?ED Discharge Orders   ? ? None  ? ?  ? ? ?  ?Merrily Pew, MD ?04/20/21 [phone] ? ?

## 2021-04-20 NOTE — ED Triage Notes (Addendum)
Pt brought in by EMS from [facility] after she was found by staff laying in floor of her room. Pt denies LOC. Staff states that pt has skin tear to L knee which they dressed. EMS noted pt to be bradycardic with HR of 48 and states pt is on Labetalol. ?

## 2021-04-24 DIAGNOSIS — M6281 Muscle weakness (generalized): Secondary | ICD-10-CM | POA: Diagnosis not present

## 2021-04-24 DIAGNOSIS — D529 Folate deficiency anemia, unspecified: Secondary | ICD-10-CM | POA: Diagnosis not present

## 2021-04-24 DIAGNOSIS — S37009D Unspecified injury of unspecified kidney, subsequent encounter: Secondary | ICD-10-CM | POA: Diagnosis not present

## 2021-04-24 DIAGNOSIS — E46 Unspecified protein-calorie malnutrition: Secondary | ICD-10-CM | POA: Diagnosis not present

## 2021-04-24 DIAGNOSIS — D638 Anemia in other chronic diseases classified elsewhere: Secondary | ICD-10-CM | POA: Diagnosis not present

## 2021-04-26 DIAGNOSIS — D529 Folate deficiency anemia, unspecified: Secondary | ICD-10-CM | POA: Diagnosis not present

## 2021-04-26 DIAGNOSIS — M6281 Muscle weakness (generalized): Secondary | ICD-10-CM | POA: Diagnosis not present

## 2021-04-26 DIAGNOSIS — D638 Anemia in other chronic diseases classified elsewhere: Secondary | ICD-10-CM | POA: Diagnosis not present

## 2021-04-26 DIAGNOSIS — E46 Unspecified protein-calorie malnutrition: Secondary | ICD-10-CM | POA: Diagnosis not present

## 2021-04-26 DIAGNOSIS — S37009D Unspecified injury of unspecified kidney, subsequent encounter: Secondary | ICD-10-CM | POA: Diagnosis not present

## 2021-05-08 DIAGNOSIS — N390 Urinary tract infection, site not specified: Secondary | ICD-10-CM | POA: Diagnosis not present

## 2021-05-10 ENCOUNTER — Emergency Department (HOSPITAL_COMMUNITY): Payer: Medicare Other

## 2021-05-10 ENCOUNTER — Observation Stay (HOSPITAL_COMMUNITY)
Admission: EM | Admit: 2021-05-10 | Discharge: 2021-05-14 | Disposition: A | Discharge status: Home / Self Care | Payer: Medicare Other | Attending: Internal Medicine | Admitting: Internal Medicine

## 2021-05-10 ENCOUNTER — Other Ambulatory Visit: Payer: Self-pay

## 2021-05-10 ENCOUNTER — Encounter (HOSPITAL_COMMUNITY): Payer: Self-pay | Admitting: Emergency Medicine

## 2021-05-10 DIAGNOSIS — I2510 Atherosclerotic heart disease of native coronary artery without angina pectoris: Secondary | ICD-10-CM | POA: Insufficient documentation

## 2021-05-10 DIAGNOSIS — Z20822 Contact with and (suspected) exposure to covid-19: Secondary | ICD-10-CM | POA: Insufficient documentation

## 2021-05-10 DIAGNOSIS — E440 Moderate protein-calorie malnutrition: Secondary | ICD-10-CM | POA: Diagnosis not present

## 2021-05-10 DIAGNOSIS — I959 Hypotension, unspecified: Secondary | ICD-10-CM | POA: Diagnosis not present

## 2021-05-10 DIAGNOSIS — K922 Gastrointestinal hemorrhage, unspecified: Secondary | ICD-10-CM | POA: Diagnosis not present

## 2021-05-10 DIAGNOSIS — Z955 Presence of coronary angioplasty implant and graft: Secondary | ICD-10-CM | POA: Insufficient documentation

## 2021-05-10 DIAGNOSIS — E039 Hypothyroidism, unspecified: Secondary | ICD-10-CM | POA: Insufficient documentation

## 2021-05-10 DIAGNOSIS — D62 Acute posthemorrhagic anemia: Principal | ICD-10-CM | POA: Insufficient documentation

## 2021-05-10 DIAGNOSIS — K224 Dyskinesia of esophagus: Secondary | ICD-10-CM | POA: Diagnosis present

## 2021-05-10 DIAGNOSIS — E871 Hypo-osmolality and hyponatremia: Secondary | ICD-10-CM | POA: Insufficient documentation

## 2021-05-10 DIAGNOSIS — Z8679 Personal history of other diseases of the circulatory system: Secondary | ICD-10-CM | POA: Insufficient documentation

## 2021-05-10 DIAGNOSIS — D649 Anemia, unspecified: Secondary | ICD-10-CM | POA: Diagnosis not present

## 2021-05-10 DIAGNOSIS — I10 Essential (primary) hypertension: Secondary | ICD-10-CM | POA: Diagnosis not present

## 2021-05-10 DIAGNOSIS — Z79899 Other long term (current) drug therapy: Secondary | ICD-10-CM | POA: Diagnosis not present

## 2021-05-10 DIAGNOSIS — F03911 Unspecified dementia, unspecified severity, with agitation: Secondary | ICD-10-CM | POA: Insufficient documentation

## 2021-05-10 DIAGNOSIS — G9389 Other specified disorders of brain: Secondary | ICD-10-CM | POA: Diagnosis not present

## 2021-05-10 DIAGNOSIS — Z743 Need for continuous supervision: Secondary | ICD-10-CM | POA: Diagnosis not present

## 2021-05-10 DIAGNOSIS — R6889 Other general symptoms and signs: Secondary | ICD-10-CM | POA: Diagnosis not present

## 2021-05-10 DIAGNOSIS — G319 Degenerative disease of nervous system, unspecified: Secondary | ICD-10-CM | POA: Diagnosis not present

## 2021-05-10 DIAGNOSIS — Z7982 Long term (current) use of aspirin: Secondary | ICD-10-CM | POA: Diagnosis not present

## 2021-05-10 DIAGNOSIS — R531 Weakness: Secondary | ICD-10-CM | POA: Diagnosis present

## 2021-05-10 DIAGNOSIS — S0990XA Unspecified injury of head, initial encounter: Secondary | ICD-10-CM | POA: Diagnosis not present

## 2021-05-10 DIAGNOSIS — R410 Disorientation, unspecified: Secondary | ICD-10-CM | POA: Diagnosis not present

## 2021-05-10 LAB — COMPREHENSIVE METABOLIC PANEL
ALT: 15 U/L (ref 0–44)
AST: 23 U/L (ref 15–41)
Albumin: 3.7 g/dL (ref 3.5–5.0)
Alkaline Phosphatase: 96 U/L (ref 38–126)
Anion gap: 6 (ref 5–15)
BUN: 36 mg/dL — ABNORMAL HIGH (ref 8–23)
CO2: 26 mmol/L (ref 22–32)
Calcium: 9.5 mg/dL (ref 8.9–10.3)
Chloride: 101 mmol/L (ref 98–111)
Creatinine, Ser: 0.78 mg/dL (ref 0.44–1.00)
GFR, Estimated: >60 mL/min (ref >60)
Glucose, Bld: 124 mg/dL — ABNORMAL HIGH (ref 70–99)
Potassium: 4.3 mmol/L (ref 3.5–5.1)
Sodium: 133 mmol/L — ABNORMAL LOW (ref 135–145)
Total Bilirubin: 0.3 mg/dL (ref 0.3–1.2)
Total Protein: 6.5 g/dL (ref 6.5–8.1)

## 2021-05-10 LAB — IRON AND TIBC
Iron: 12 ug/dL — ABNORMAL LOW (ref 28–170)
Saturation Ratios: 2 % — ABNORMAL LOW (ref 10.4–31.8)
TIBC: 485 ug/dL — ABNORMAL HIGH (ref 250–450)
UIBC: 473 ug/dL

## 2021-05-10 LAB — CBC WITH DIFFERENTIAL/PLATELET
Abs Immature Granulocytes: 0.01 10*3/uL (ref 0.00–0.07)
Basophils Absolute: 0 10*3/uL (ref 0.0–0.1)
Basophils Relative: 1 %
Eosinophils Absolute: 0 10*3/uL (ref 0.0–0.5)
Eosinophils Relative: 0 %
HCT: 26.2 % — ABNORMAL LOW (ref 36.0–46.0)
Hemoglobin: 7.8 g/dL — ABNORMAL LOW (ref 12.0–15.0)
Immature Granulocytes: 0 %
Lymphocytes Relative: 31 %
Lymphs Abs: 1.2 10*3/uL (ref 0.7–4.0)
MCH: 23.6 pg — ABNORMAL LOW (ref 26.0–34.0)
MCHC: 29.8 g/dL — ABNORMAL LOW (ref 30.0–36.0)
MCV: 79.2 fL — ABNORMAL LOW (ref 80.0–100.0)
Monocytes Absolute: 0.5 10*3/uL (ref 0.1–1.0)
Monocytes Relative: 13 %
Neutro Abs: 2.1 10*3/uL (ref 1.7–7.7)
Neutrophils Relative %: 55 %
Platelets: 202 10*3/uL (ref 150–400)
RBC: 3.31 MIL/uL — ABNORMAL LOW (ref 3.87–5.11)
RDW: 15.9 % — ABNORMAL HIGH (ref 11.5–15.5)
WBC: 3.9 10*3/uL — ABNORMAL LOW (ref 4.0–10.5)
nRBC: 0 % (ref 0.0–0.2)

## 2021-05-10 LAB — FERRITIN: Ferritin: 10 ng/mL — ABNORMAL LOW (ref 11–307)

## 2021-05-10 LAB — RESP PANEL BY RT-PCR (FLU A&B, COVID) ARPGX2
Influenza A by PCR: NEGATIVE
Influenza B by PCR: NEGATIVE
SARS Coronavirus 2 by RT PCR: NEGATIVE

## 2021-05-10 LAB — LACTATE DEHYDROGENASE: LDH: 143 U/L (ref 98–192)

## 2021-05-10 LAB — TSH: TSH: 3.036 u[IU]/mL (ref 0.350–4.500)

## 2021-05-10 LAB — PREPARE RBC (CROSSMATCH)

## 2021-05-10 LAB — POC OCCULT BLOOD, ED: Fecal Occult Bld: POSITIVE — AB

## 2021-05-10 LAB — CBG MONITORING, ED: Glucose-Capillary: 132 mg/dL — ABNORMAL HIGH (ref 70–99)

## 2021-05-10 MED ORDER — BISACODYL 10 MG RE SUPP: 10.0000 mg | Freq: Every day | RECTAL | Status: DC | PRN

## 2021-05-10 MED ORDER — ONDANSETRON HCL 4 MG PO TABS: 4.0000 mg | ORAL_TABLET | Freq: Four times a day (QID) | ORAL | Status: DC | PRN

## 2021-05-10 MED ORDER — ACETAMINOPHEN 325 MG PO TABS: 650.0000 mg | ORAL_TABLET | Freq: Four times a day (QID) | ORAL | Status: DC | PRN

## 2021-05-10 MED ORDER — LORAZEPAM 2 MG/ML IJ SOLN
0.5000 mg | Freq: Two times a day (BID) | INTRAMUSCULAR | Status: DC | PRN
Start: 2021-05-10 — End: 2021-05-14

## 2021-05-10 MED ORDER — SODIUM CHLORIDE 0.9 % IV SOLN: 250.0000 mL | INTRAVENOUS | Status: DC | PRN

## 2021-05-10 MED ORDER — ONDANSETRON HCL 4 MG/2ML IJ SOLN
4.0000 mg | Freq: Four times a day (QID) | INTRAMUSCULAR | Status: DC | PRN
Start: 2021-05-10 — End: 2021-05-14

## 2021-05-10 MED ORDER — FOLIC ACID 1 MG PO TABS
1.0000 mg | ORAL_TABLET | Freq: Every day | ORAL | Status: DC
Start: 2021-05-11 — End: 2021-05-14
  Administered 2021-05-11 – 2021-05-14 (×4): 1 mg via ORAL
  Filled 2021-05-10 (×4): qty 1

## 2021-05-10 MED ORDER — SODIUM CHLORIDE 0.9 % IV SOLN: 10.0000 mL/h | Freq: Once | INTRAVENOUS | Status: DC

## 2021-05-10 MED ORDER — PANTOPRAZOLE SODIUM 40 MG IV SOLR
80.0000 mg | Freq: Once | INTRAVENOUS | Status: AC
  Administered 2021-05-10: 80 mg via INTRAVENOUS
  Filled 2021-05-10: qty 20

## 2021-05-10 MED ORDER — MIRTAZAPINE 15 MG PO TABS
7.5000 mg | ORAL_TABLET | Freq: Every day | ORAL | Status: DC
  Administered 2021-05-10 – 2021-05-13 (×4): 7.5 mg via ORAL
  Filled 2021-05-10 (×4): qty 1

## 2021-05-10 MED ORDER — ACETAMINOPHEN 650 MG RE SUPP: 650.0000 mg | Freq: Four times a day (QID) | RECTAL | Status: DC | PRN

## 2021-05-10 MED ORDER — TRAZODONE HCL 50 MG PO TABS: 50.0000 mg | ORAL_TABLET | Freq: Every day | ORAL | Status: DC

## 2021-05-10 MED ORDER — SODIUM CHLORIDE 0.9% FLUSH
3.0000 mL | Freq: Two times a day (BID) | INTRAVENOUS | Status: DC
  Administered 2021-05-10 – 2021-05-14 (×7): 3 mL via INTRAVENOUS

## 2021-05-10 MED ORDER — PANTOPRAZOLE SODIUM 40 MG IV SOLR: 40.0000 mg | Freq: Once | INTRAVENOUS | Status: DC

## 2021-05-10 MED ORDER — MIRTAZAPINE 15 MG PO TABS: 7.5000 mg | ORAL_TABLET | Freq: Every day | ORAL | Status: DC

## 2021-05-10 MED ORDER — SODIUM CHLORIDE 0.9 % IV SOLN: INTRAVENOUS | Status: AC

## 2021-05-10 MED ORDER — SODIUM CHLORIDE 0.9% FLUSH: 3.0000 mL | INTRAVENOUS | Status: DC | PRN

## 2021-05-10 MED ORDER — DULOXETINE HCL 30 MG PO CPEP
30.0000 mg | ORAL_CAPSULE | Freq: Every day | ORAL | Status: DC
  Administered 2021-05-11 – 2021-05-14 (×4): 30 mg via ORAL
  Filled 2021-05-10 (×4): qty 1

## 2021-05-10 MED ORDER — LEVOTHYROXINE SODIUM 25 MCG PO TABS
25.0000 ug | ORAL_TABLET | Freq: Every day | ORAL | Status: DC
  Administered 2021-05-11 – 2021-05-14 (×4): 25 ug via ORAL
  Filled 2021-05-10 (×4): qty 1

## 2021-05-10 MED ORDER — SODIUM CHLORIDE 0.9% FLUSH
3.0000 mL | Freq: Two times a day (BID) | INTRAVENOUS | Status: DC
  Administered 2021-05-12 – 2021-05-13 (×2): 3 mL via INTRAVENOUS

## 2021-05-10 MED ORDER — PANTOPRAZOLE SODIUM 40 MG IV SOLR
40.0000 mg | Freq: Two times a day (BID) | INTRAVENOUS | Status: DC
  Administered 2021-05-11 – 2021-05-13 (×6): 40 mg via INTRAVENOUS
  Filled 2021-05-10 (×6): qty 10

## 2021-05-10 MED ORDER — MIDODRINE HCL 5 MG PO TABS
2.5000 mg | ORAL_TABLET | Freq: Three times a day (TID) | ORAL | Status: DC
  Administered 2021-05-11 – 2021-05-14 (×10): 2.5 mg via ORAL
  Filled 2021-05-10 (×10): qty 1

## 2021-05-10 MED ORDER — POLYETHYLENE GLYCOL 3350 17 G PO PACK: 17.0000 g | PACK | Freq: Every day | ORAL | Status: DC | PRN

## 2021-05-10 MED ORDER — TRAZODONE HCL 50 MG PO TABS: 50.0000 mg | ORAL_TABLET | Freq: Every evening | ORAL | Status: DC | PRN

## 2021-05-10 MED ORDER — METOPROLOL TARTRATE 25 MG PO TABS
12.5000 mg | ORAL_TABLET | Freq: Two times a day (BID) | ORAL | Status: DC
  Administered 2021-05-10 – 2021-05-14 (×7): 12.5 mg via ORAL
  Filled 2021-05-10 (×7): qty 1

## 2021-05-10 MED ORDER — PROSOURCE NO CARB PO LIQD
30.0000 mL | Freq: Two times a day (BID) | ORAL | Status: DC
  Administered 2021-05-13: 30 mL via ORAL
  Filled 2021-05-10 (×10): qty 30

## 2021-05-10 MED ORDER — ISOSORBIDE MONONITRATE 10 MG PO TABS
5.0000 mg | ORAL_TABLET | Freq: Three times a day (TID) | ORAL | Status: DC
  Administered 2021-05-11 – 2021-05-14 (×11): 5 mg via ORAL
  Filled 2021-05-10 (×17): qty 0.5

## 2021-05-10 NOTE — ED Notes (Signed)
The pt is alert to self only. She answers orientation questions almost right. The facility reports the patient has been checked for a UTI and was negative. ? ? ?

## 2021-05-10 NOTE — ED Notes (Signed)
Patient transported to CT 

## 2021-05-10 NOTE — H&P (Signed)
?                                                                                           ? ? ? ? Patient Demographics:  ? ? ?Melissa Bentley, is a 88 y.o. female  MRN: [id number]   DOB - 21-Sep-1927 ? ?Admit Date - 05/10/2021 ? ?Outpatient Primary MD for the patient is System, Provider Not In ? ? Assessment & Plan:  ? ?Assessment and Plan: ? ? ?1)acute on chronic symptomatic anemia due to acute blood loss ?--In the ED patient is found to have hemoglobin of 7.8, which is about 2 g below her recent baseline ?-Stool Hemoccult is positive and patient reports melanotic stools ?-Apparently her Protonix was stopped recently ?-Ferritin is down to 10, it was 48 a couple months ago ?-Serum iron is 12 it was 38 a couple of months ago ?-Iron saturation is down to 2 it was 10 a couple months ago ?--Symptomatic anemia-- family also reports weakness, fatigue and falls query dizziness leading to falls due to worsening anemia ?-Transfuse 1 unit of PRBC ?-Give IV Protonix ?-GI consult ? ?2)Protein caloric malnutrition/FTT-- ?Body mass index is 17.22 kg/m?. ?--Okay to liberalize diet ?-Give nutritional supplements ?-Give Remeron nightly for appetite stimulation ? ?3)Hypothyroidism--- TSH is 3.0 continue levothyroxine ? ?4)Mild Hyponatremia--sodium is 133 suspect due to dehydration with a BUN/creatinine ratio over 40 ?-Hydrate gently IV  ?-Push oral fluids ? ?5)Chronic leukopenia----stable, ???  Etiology ? ?6) dysphagia--ecent esophagram showing substantial esophageal dysmotility disorder concerning for presbyesophagus, epiphrenic diverticula or gastric diverticula, hiatal hernia ?-Initially on pur?ed diet, diet was recently liberalized at the assisted living facility at the patient and family's request ?-Continue to feed liberally as desired ?-Patient and family does not report any episodes of aspiration recently ? ?7) chronic hypotension--- recent work-up  unremarkable with normal cortisol ?-Continue midodrine ? ?8) dementia with behavioral disturbance----CT head unremarkable TSH WNL ?-Give Remeron nightly, as needed lorazepam ? ?Disposition/Need for in-Hospital Stay- patient unable to be discharged at this time due to --symptomatic anemia requiring transfusion* ? ?Dispo: The patient is from: ALF ?             Anticipated d/c is to: ALF ?             Anticipated d/c date is: 1 day ?             Patient currently is not medically stable to d/c. ?Barriers: Not Clinically Stable-  ? ?With History of - ?Reviewed by me ? ?Past Medical History:  ?Diagnosis Date  ? CAD (coronary artery disease)   ? Depression   ? GERD (gastroesophageal reflux disease)   ? Hyperlipidemia   ? Hypertension   ? Hypothyroidism   ? MI (myocardial infarction) ([facility])   ? Osteoporosis   ? Psoriasis   ? PVD (peripheral vascular disease) ([place])   ?   ? ?Past Surgical History:  ?Procedure Laterality Date  ? ABDOMINAL HYSTERECTOMY    ? CATARACT EXTRACTION Bilateral   ? COLONOSCOPY N/A 02/01/2015  ? Dr. Rourk:colonic diverticulosis s/p biopsy with unremarkable colonic mucosa, no microscopic colitis.   ? CORONARY  ANGIOPLASTY WITH STENT PLACEMENT    ? CRANIOTOMY Right 10/03/2017  ? Procedure: CRANIOTOMY HEMATOMA EVACUATION SUBDURAL;  Surgeon: Coletta Memos, MD;  [facility];  Service: Neurosurgery;  Laterality: Right;  ? CRANIOTOMY Right 10/05/2017  ? Procedure: REDO CRANIOTOMY HEMATOMA EVACUATION SUBDURAL;  Surgeon: Coletta Memos, MD;  [facility]: [facility];  Service: Neurosurgery;  Laterality: Right;  ? ESOPHAGEAL DILATION  03/03/2021  ? Procedure: ESOPHAGEAL DILATION;  Surgeon: Malissa Hippo, MD;  [facility];  Service: Endoscopy;;  ? ESOPHAGOGASTRODUODENOSCOPY (EGD) WITH PROPOFOL N/A 03/03/2021  ? Procedure: ESOPHAGOGASTRODUODENOSCOPY (EGD) WITH PROPOFOL;  Surgeon: Malissa Hippo, MD;  [facility];  Service: Endoscopy;  Laterality: N/A;  ? EYE SURGERY    ? LUNG LOBECTOMY    ? SUBCLAVIAN  ARTERY STENT    ? TUBAL LIGATION    ? ?Chief Complaint  ?Patient presents with  ? Agitation  ?  ? ? HPI:  ? ? Melissa Bentley  is a 88 y.o. female  with PMH of remote CAD/stent, HTN, HLD, hypothyroidism, GERD and remote subdural hematoma s/p evacuation , as well as history of dysphagia, with recent esophagram showing substantial esophageal dysmotility disorder concerning for presbyesophagus, epiphrenic diverticula or gastric diverticula, hiatal hernia and advanced dementia with sundowning presents to the ED from [facility] with fatigue, weakness and worsening behavioral problems ?-Family also reports weakness, fatigue and falls query dizziness leading to falls due to worsening anemia ? ?-In the ED patient is found to have hemoglobin of 7.8, which is about 2 g below her recent baseline ?-Stool Hemoccult is positive and patient reports melanotic stools ?-Apparently her Protonix was stopped recently ?--Serum iron and ferritin are low ?-Sodium is 133, TSH WNL, LFTs WNL ?-CT head and chest x-ray without acute findings ? ? ? Review of systems:  ?  ?In addition to the HPI above,  ? ?A full Review of  Systems was done, all other systems reviewed are negative except as noted above in HPI , . ? ? ? Social History:  ?Reviewed by me ? ?  ?Social History  ? ?Tobacco Use  ? Smoking status: Former  ?  Types: Cigarettes  ?  Quit date: 01/17/1996  ?  Years since quitting: 25.3  ? Smokeless tobacco: Never  ?Substance Use Topics  ? Alcohol use: No  ?  Alcohol/week: 0.0 standard drinks  ? ? Family History :  ?Reviewed by me ? ?  ?Family History  ?Problem Relation Age of Onset  ? Lung cancer Sister   ? Macular degeneration Sister   ? Colon cancer Neg Hx   ? ? Home Medications:  ? ?Prior to Admission medications   ?Medication Sig Start Date End Date Taking? Authorizing Provider  ?acetaminophen (TYLENOL) 500 MG tablet Take 500 mg by mouth every 4 (four) hours as needed for mild pain, fever or headache.   Yes [provider]   ?alum & mag hydroxide-simeth (MAALOX/MYLANTA) 200-200-20 MG/5ML suspension Take 30 mLs by mouth every 6 (six) hours as needed for indigestion or heartburn.   Yes [provider]  ?Amino Acids-Protein Hydrolys (FEEDING SUPPLEMENT, PRO-STAT SUGAR FREE 64,) LIQD Take 30 mLs by mouth 2 (two) times daily.   Yes [provider]  ?aspirin 81 MG tablet Take 1 tablet (81 mg total) by mouth daily. Reported on 04/27/2015 03/12/21  Yes Almon Hercules, MD  ?clonazePAM (KLONOPIN) 0.5 MG tablet Take 0.25 mg by mouth 2 (two) times daily as needed for anxiety.  Yes [provider]  ?DULoxetine (CYMBALTA) 30 MG capsule Take 30 mg by mouth daily. 02/13/21  Yes [provider]  ?folic acid (FOLVITE) 1 MG tablet Take 1 tablet (1 mg total) by mouth daily. 03/06/21  Yes Mercy Riding, MD  ?guaiFENesin (ROBITUSSIN) 100 MG/5ML liquid Take 5 mLs by mouth every 6 (six) hours as needed for cough or to loosen phlegm.   Yes [provider]  ?isosorbide mononitrate (ISMO) 10 MG tablet Take 0.5 tablets (5 mg total) by mouth 3 (three) times daily before meals. 03/05/21  Yes Mercy Riding, MD  ?levothyroxine (SYNTHROID) 50 MCG tablet Take 25 mcg by mouth daily. 07/01/18  Yes [provider]  ?metoprolol tartrate (LOPRESSOR) 25 MG tablet Take 0.5 tablets (12.5 mg total) by mouth 2 (two) times daily. 03/05/21  Yes Mercy Riding, MD  ?midodrine (PROAMATINE) 2.5 MG tablet Take 1 tablet (2.5 mg total) by mouth 3 (three) times daily with meals. 03/05/21  Yes Mercy Riding, MD  ?neomycin-bacitracin-polymyxin (NEOSPORIN) ointment Apply 1 application. topically 2 (two) times daily. Left arm until lesion is healed   Yes [provider]  ?pantoprazole (PROTONIX) 40 MG tablet Take 40 mg by mouth daily.   Yes [provider]  ?traZODone (DESYREL) 50 MG tablet Take 1 tablet (50 mg total) by mouth at bedtime. 03/05/21  Yes Mercy Riding, MD  ?acetaminophen (TYLENOL) 160 MG/5ML solution Take 20 mLs (640 mg  total) by mouth every 6 (six) hours as needed for fever, headache or mild pain. ?Patient not taking: Reported on 05/10/2021 03/05/21   Mercy Riding, MD  ?[employer] [facility]) OINT Apply 1 appl

## 2021-05-10 NOTE — ED Provider Notes (Signed)
?[facility] DEPARTMENT ?Provider Note ? ? ?CSN: [id number] ?Arrival date & time: 05/10/21  1410 ? ?  ? ?History ? ?Chief Complaint  ?Patient presents with  ? Agitation  ? ? ?Melissa Bentley is a 88 y.o. female. ? ? Patient as above with significant medical history as below, including Depression, CAD, hypothyroid, PVD who presents to the ED with complaint of agitation. ? ?Patient resides at assisted living facility, per family she has been more agitated in the evenings over the past 2 weeks, aggressive towards nursing staff and other residents.  She was recent treated for UTI.  She also to fall a few days ago because she attempted to ambulate without assistance from her bed.  No fevers, chills, chest pain, dyspnea abdominal pain, nausea or vomiting.  No change in bowel or bladder function.  No recent medication changes.  She is tolerant p.o. intake without significant difficulty although she does have chronic swallowing issues associated with esophagus abnormality. ? ?Behavior changes occur primarily in the evening.  Family reports that nursing facility was going to "start a medication to help her sleep" but have not initiated this yet ? ? ?Past Medical History: ?No date: CAD (coronary artery disease) ?No date: Depression ?No date: GERD (gastroesophageal reflux disease) ?No date: Hyperlipidemia ?No date: Hypertension ?No date: Hypothyroidism ?No date: MI (myocardial infarction) ([facility]) ?No date: Osteoporosis ?No date: Psoriasis ?No date: PVD (peripheral vascular disease) ([facility]) ? ?Past Surgical History: ?No date: ABDOMINAL HYSTERECTOMY ?No date: CATARACT EXTRACTION; Bilateral ?02/01/2015: COLONOSCOPY; N/A ?    Comment:  Dr. Rourk:colonic diverticulosis s/p biopsy with  ?             unremarkable colonic mucosa, no microscopic colitis.  ?No date: CORONARY ANGIOPLASTY WITH STENT PLACEMENT ?10/03/2017: CRANIOTOMY; Right ?    Comment:  Procedure: CRANIOTOMY HEMATOMA EVACUATION SUBDURAL;   ?             Surgeon:  Coletta Memos, MD;  [facility];  Service:  ?             Neurosurgery;  Laterality: Right; ?10/05/2017: CRANIOTOMY; Right ?    Comment:  Procedure: REDO CRANIOTOMY HEMATOMA EVACUATION SUBDURAL; ?             Surgeon: Coletta Memos, MD;  [facility];  Service:  ?             Neurosurgery;  Laterality: Right; ?03/03/2021: ESOPHAGEAL DILATION ?    Comment:  Procedure: ESOPHAGEAL DILATION;  Surgeon: Malissa Hippo, MD;  [facility];  Service: Endoscopy;; ?03/03/2021: ESOPHAGOGASTRODUODENOSCOPY (EGD) WITH PROPOFOL; N/A ?    Comment:  Procedure: ESOPHAGOGASTRODUODENOSCOPY (EGD) WITH  ?             PROPOFOL;  Surgeon: Malissa Hippo, MD;  [facility]  ?             END[facility] SUITE;  Service: Endoscopy;  Laterality: N/A; ?No date: EYE SURGERY ?No date: LUNG LOBECTOMY ?No date: SUBCLAVIAN ARTERY STENT ?No date: TUBAL LIGATION  ? ? ?The history is provided by the spouse, the patient and a relative. No language interpreter was used.  ? ?  ? ?Home Medications ?Prior to Admission medications   ?Medication Sig Start Date End Date Taking? Authorizing Provider  ?acetaminophen (TYLENOL) 500 MG tablet Take 500 mg by mouth every 4 (four) hours as needed for mild pain, fever or headache.  Yes [provider]  ?alum & mag hydroxide-simeth (MAALOX/MYLANTA) 200-200-20 MG/5ML suspension Take 30 mLs by mouth every 6 (six) hours as needed for indigestion or heartburn.   Yes [provider]  ?Amino Acids-Protein Hydrolys (FEEDING SUPPLEMENT, PRO-STAT SUGAR FREE 64,) LIQD Take 30 mLs by mouth 2 (two) times daily.   Yes [provider]  ?aspirin 81 MG tablet Take 1 tablet (81 mg total) by mouth daily. Reported on 04/27/2015 03/12/21  Yes Almon HerculesGonfa, Taye T, MD  ?clonazePAM (KLONOPIN) 0.5 MG tablet Take 0.25 mg by mouth 2 (two) times daily as needed for anxiety.   Yes [provider]  ?DULoxetine (CYMBALTA) 30 MG capsule Take 30 mg by mouth daily. 02/13/21  Yes [provider]   ?folic acid (FOLVITE) 1 MG tablet Take 1 tablet (1 mg total) by mouth daily. 03/06/21  Yes Almon HerculesGonfa, Taye T, MD  ?guaiFENesin (ROBITUSSIN) 100 MG/5ML liquid Take 5 mLs by mouth every 6 (six) hours as needed for cough or to loosen phlegm.   Yes [provider]  ?isosorbide mononitrate (ISMO) 10 MG tablet Take 0.5 tablets (5 mg total) by mouth 3 (three) times daily before meals. 03/05/21  Yes Almon HerculesGonfa, Taye T, MD  ?levothyroxine (SYNTHROID) 50 MCG tablet Take 25 mcg by mouth daily. 07/01/18  Yes [provider]  ?metoprolol tartrate (LOPRESSOR) 25 MG tablet Take 0.5 tablets (12.5 mg total) by mouth 2 (two) times daily. 03/05/21  Yes Almon HerculesGonfa, Taye T, MD  ?midodrine (PROAMATINE) 2.5 MG tablet Take 1 tablet (2.5 mg total) by mouth 3 (three) times daily with meals. 03/05/21  Yes Almon HerculesGonfa, Taye T, MD  ?neomycin-bacitracin-polymyxin (NEOSPORIN) ointment Apply 1 application. topically 2 (two) times daily. Left arm until lesion is healed   Yes [provider]  ?pantoprazole (PROTONIX) 40 MG tablet Take 40 mg by mouth daily.   Yes [provider]  ?traZODone (DESYREL) 50 MG tablet Take 1 tablet (50 mg total) by mouth at bedtime. 03/05/21  Yes Almon HerculesGonfa, Taye T, MD  ?acetaminophen (TYLENOL) 160 MG/5ML solution Take 20 mLs (640 mg total) by mouth every 6 (six) hours as needed for fever, headache or mild pain. ?Patient not taking: Reported on 05/10/2021 03/05/21   Almon HerculesGonfa, Taye T, MD  ?[employer] [employer] [facility](VENELEX) OINT Apply 1 application topically as directed. Apply to coccyx, sacrum and bilateral buttocks every shift.    [provider]  ?loperamide HCl (IMODIUM) 2 MG/15ML solution Take 15 mLs (2 mg total) by mouth as needed for diarrhea or loose stools. ?Patient not taking: Reported on 05/10/2021 03/05/21   Almon HerculesGonfa, Taye T, MD  ?ondansetron (ZOFRAN-ODT) 4 MG disintegrating tablet Take 1 tablet (4 mg total) by mouth every 8 (eight) hours as needed for nausea or vomiting. ?Patient not taking: Reported on 05/10/2021  03/05/21   Almon HerculesGonfa, Taye T, MD  ?   ? ?Allergies    ?Patient has no known allergies.   ? ?Review of Systems   ?Review of Systems  ?Constitutional:  Negative for chills and fever.  ?HENT:  Negative for facial swelling and trouble swallowing.   ?Eyes:  Negative for photophobia and visual disturbance.  ?Respiratory:  Negative for cough and shortness of breath.   ?Cardiovascular:  Negative for chest pain and palpitations.  ?Gastrointestinal:  Negative for abdominal pain, nausea and vomiting.  ?Endocrine: Negative for polydipsia and polyuria.  ?Genitourinary:  Negative for difficulty urinating and hematuria.  ?Musculoskeletal:  Negative for gait problem and joint swelling.  ?Skin:  Negative for pallor and  rash.  ?Neurological:  Negative for syncope and headaches.  ?Psychiatric/Behavioral:  Positive for agitation and behavioral problems. Negative for confusion.   ? ?Physical Exam ?Updated Vital Signs ?BP (!) 151/61   Pulse 66   Temp 98.3 ?F (36.8 ?C) (Oral)   Resp 20   Ht 5' (1.524 m)   Wt 40 kg   SpO2 95%   BMI 17.22 kg/m?  ?Physical Exam ?Vitals and nursing note reviewed.  ?Constitutional:   ?   General: She is not in acute distress. ?   Appearance: Normal appearance.  ?HENT:  ?   Head: Normocephalic. No raccoon eyes, Battle's sign, right periorbital erythema or left periorbital erythema.  ?   Jaw: There is normal jaw occlusion. No trismus.  ?   Comments: Bruise to left temple ?   Right Ear: External ear normal.  ?   Left Ear: External ear normal.  ?   Nose: Nose normal.  ?   Mouth/Throat:  ?   Mouth: Mucous membranes are moist.  ?Eyes:  ?   General: No scleral icterus.    ?   Right eye: No discharge.     ?   Left eye: No discharge.  ?   Extraocular Movements: Extraocular movements intact.  ?   Pupils: Pupils are equal, round, and reactive to light.  ?Cardiovascular:  ?   Rate and Rhythm: Normal rate and regular rhythm.  ?   Pulses: Normal pulses.  ?   Heart sounds: Normal heart sounds.  ?Pulmonary:  ?   Effort:  Pulmonary effort is normal. No tachypnea or respiratory distress.  ?   Breath sounds: Normal breath sounds.  ?Abdominal:  ?   General: Abdomen is flat.  ?   Palpations: Abdomen is soft.  ?   Tenderness: There is no abd

## 2021-05-10 NOTE — ED Triage Notes (Signed)
Pt to the ED via [facility] with new onset confusion and aggression towards staff at [facility] [place]. ? ?The patient states the staff has been breaking her down and making her cry. ? ?Staff reports a history of dementia. Pt has an old bruise to her left temple due to a fall. She was seen here after the fall. ? ?

## 2021-05-11 DIAGNOSIS — E871 Hypo-osmolality and hyponatremia: Secondary | ICD-10-CM

## 2021-05-11 DIAGNOSIS — E440 Moderate protein-calorie malnutrition: Secondary | ICD-10-CM

## 2021-05-11 DIAGNOSIS — K224 Dyskinesia of esophagus: Secondary | ICD-10-CM

## 2021-05-11 DIAGNOSIS — F03911 Unspecified dementia, unspecified severity, with agitation: Secondary | ICD-10-CM | POA: Diagnosis not present

## 2021-05-11 DIAGNOSIS — D649 Anemia, unspecified: Secondary | ICD-10-CM | POA: Diagnosis not present

## 2021-05-11 DIAGNOSIS — K922 Gastrointestinal hemorrhage, unspecified: Secondary | ICD-10-CM | POA: Diagnosis not present

## 2021-05-11 DIAGNOSIS — E039 Hypothyroidism, unspecified: Secondary | ICD-10-CM

## 2021-05-11 LAB — BASIC METABOLIC PANEL
Anion gap: 5 (ref 5–15)
BUN: 29 mg/dL — ABNORMAL HIGH (ref 8–23)
CO2: 25 mmol/L (ref 22–32)
Calcium: 9.3 mg/dL (ref 8.9–10.3)
Chloride: 109 mmol/L (ref 98–111)
Creatinine, Ser: 0.8 mg/dL (ref 0.44–1.00)
GFR, Estimated: >60 mL/min (ref >60)
Glucose, Bld: 82 mg/dL (ref 70–99)
Potassium: 3.9 mmol/L (ref 3.5–5.1)
Sodium: 139 mmol/L (ref 135–145)

## 2021-05-11 LAB — CBC
HCT: 31.3 % — ABNORMAL LOW (ref 36.0–46.0)
Hemoglobin: 9.5 g/dL — ABNORMAL LOW (ref 12.0–15.0)
MCH: 24.1 pg — ABNORMAL LOW (ref 26.0–34.0)
MCHC: 30.4 g/dL (ref 30.0–36.0)
MCV: 79.4 fL — ABNORMAL LOW (ref 80.0–100.0)
Platelets: 189 10*3/uL (ref 150–400)
RBC: 3.94 MIL/uL (ref 3.87–5.11)
RDW: 15.3 % (ref 11.5–15.5)
WBC: 4.3 10*3/uL (ref 4.0–10.5)
nRBC: 0 % (ref 0.0–0.2)

## 2021-05-11 LAB — URINALYSIS, ROUTINE W REFLEX MICROSCOPIC
Bilirubin Urine: NEGATIVE
Glucose, UA: NEGATIVE mg/dL
Hgb urine dipstick: NEGATIVE
Ketones, ur: NEGATIVE mg/dL
Leukocytes,Ua: NEGATIVE
Nitrite: NEGATIVE
Protein, ur: NEGATIVE mg/dL
Specific Gravity, Urine: 1.003 — ABNORMAL LOW (ref 1.005–1.030)
pH: 8 (ref 5.0–8.0)

## 2021-05-11 LAB — RAPID URINE DRUG SCREEN, HOSP PERFORMED
Amphetamines: NOT DETECTED
Barbiturates: NOT DETECTED
Benzodiazepines: NOT DETECTED
Cocaine: NOT DETECTED
Opiates: NOT DETECTED
Tetrahydrocannabinol: NOT DETECTED

## 2021-05-11 LAB — TYPE AND SCREEN
ABO/RH(D): O NEG
Antibody Screen: NEGATIVE
Unit division: 0

## 2021-05-11 LAB — PROTIME-INR
INR: 1 (ref 0.8–1.2)
Prothrombin Time: 12.9 seconds (ref 11.4–15.2)

## 2021-05-11 LAB — BPAM RBC
Blood Product Expiration Date: 202305022359
ISSUE DATE / TIME: 202304132222
Unit Type and Rh: 9500

## 2021-05-11 LAB — MRSA NEXT GEN BY PCR, NASAL: MRSA by PCR Next Gen: NOT DETECTED

## 2021-05-11 MED ORDER — ENSURE ENLIVE PO LIQD
237.0000 mL | Freq: Two times a day (BID) | ORAL | Status: DC
  Administered 2021-05-11 – 2021-05-14 (×6): 237 mL via ORAL

## 2021-05-11 NOTE — Assessment & Plan Note (Addendum)
-   Appears to be secondary to upper GI bleed based on presentation symptoms. ?-S/p transfusion and after discussing with family decision to not pursued endoscopic evaluation and continue conservative management. ?-Continue PPI twice a day ?-Follow hemoglobin trend and transfuse for hemoglobin less than 7. ?-Aspirin discontinue; avoid NSAID's. ?-Hemoglobin 9.6 currently ?

## 2021-05-11 NOTE — Assessment & Plan Note (Signed)
-   Appreciate assistance and recommendation by dietitian ?-Continue feeding supplements. ?-Body mass index is 19.89 kg/m?Marland Kitchen ? ?

## 2021-05-11 NOTE — Assessment & Plan Note (Signed)
-   Continue to maintain adequate hydration and follow electrolytes trend. ?

## 2021-05-11 NOTE — Assessment & Plan Note (Signed)
-   Continue liberalize diet as tolerated ?-Continue the use of isosorbide 5 mg 3 times a day before meals ?-Make sure patient is in upright position and following soft diet and pur?ed meats. ?

## 2021-05-11 NOTE — TOC Initial Note (Addendum)
Transition of Care (TOC) - Initial/Assessment Note  ? ? ?Patient Details  ?Name: Melissa Bentley ?MRN: [id number] ?Date of Birth: 1927/08/23 ? ?Transition of Care (TOC) CM/SW Contact:    ?Leitha Bleak, RN ?Phone Number: ?05/11/2021, 1:07 PM ? ?Clinical Narrative:         Patient admitted with acute GI bleed. [facility] will not [facility] back. Chip Boer is coming at 1:30 today to assess for admission. Patient is possibly ready today pending workup. MD/RN updated. TOC to follow.    ? ?Addendum :   Clydie Braun Nurse case manger with serious illness care covering for NP Coralee Rud called to saying she understood patient is going home with hospice. TOC updated her that son said she could not go home and Chip Boer was here to assess. Her number is [phone] for updates.  ? ? ?Addendum #2  [facility] is accepting. They can not take until Monday. They will email TOC the 4 page FL2 that they need completed on Monday.  MD updated. Family states they can not take patient home. Avoidable days added. Updated Clydie Braun CM at DSS.  ? ?Barriers to Discharge: Continued Medical Work up, Other (must enter comment) (ALF to assess) ? ? ?Patient Goals and CMS Choice ?  ?  ?Expected Discharge Plan and Services ?  ?  ALF ? ?Prior Living Arrangements/Services ?  ? [facility] ?   ? ?Activities of Daily Living ?Home Assistive Devices/Equipment: Dan Humphreys (specify type) ?ADL Screening (condition at time of admission) ?Patient's cognitive ability adequate to safely complete daily activities?: Yes ?Is the patient deaf or have difficulty hearing?: Yes ?Does the patient have difficulty seeing, even when wearing glasses/contacts?: No ?Does the patient have difficulty concentrating, remembering, or making decisions?: Yes ?Patient able to express need for assistance with ADLs?: Yes ?Does the patient have difficulty dressing or bathing?: Yes ?Independently performs ADLs?: No ?Communication: Independent ?Dressing  (OT): Needs assistance ?Is this a change from baseline?: Pre-admission baseline ?Grooming: Independent ?Feeding: Independent ?Bathing: Needs assistance ?Is this a change from baseline?: Pre-admission baseline ?Toileting: Independent ?In/Out Bed: Needs assistance ?Is this a change from baseline?: Pre-admission baseline ?Walks in Home: Needs assistance ?Is this a change from baseline?: Pre-admission baseline ?Does the patient have difficulty walking or climbing stairs?: Yes ?Weakness of Legs: Both ?Weakness of Arms/Hands: None ? ?Permission Sought/Granted ?    ?Emotional Assessment ?  ?  ?Admission diagnosis:  Acute GI bleeding [K92.2] ?Upper GI bleed [K92.2] ?Symptomatic anemia [D64.9] ?Dementia with agitation, unspecified dementia severity, unspecified dementia type (HCC) [F03.911] ?Patient Active Problem List  ? Diagnosis Date Noted  ? Acute GI bleeding 05/10/2021  ? Folic acid deficiency 03/06/2021  ? Chronic anemia 03/06/2021  ? Acquired hypothyroidism 03/06/2021  ? Recurrent depression (HCC) 03/06/2021  ? PVD (peripheral vascular disease) (HCC) 03/06/2021  ? Hypotension 03/05/2021  ? High anion gap metabolic acidosis 03/03/2021  ? Hyponatremia 03/03/2021  ? Hypokalemia, hypomagnesemia and hypophosphatemia 03/03/2021  ? Pancytopenia (HCC) 03/03/2021  ? Goals of care, counseling/discussion 03/03/2021  ? Esophageal dysmotility 03/03/2021  ? Physical deconditioning 03/03/2021  ? Failure to thrive due to dysphagia 03/01/2021  ? Hypoglycemia 03/01/2021  ? Protein-calorie malnutrition, severe (HCC) 03/01/2021  ? AKI (acute kidney injury) (HCC) 03/01/2021  ? Pressure injury of skin 10/05/2017  ? Subdural hematoma, chronic (HCC) 10/03/2017  ? Diarrhea   ? Diverticulosis of colon without hemorrhage   ? Chronic diarrhea 01/17/2015  ? ?PCP:  System, Provider Not In ?Pharmacy:   ?  [facility] - [place], [place] - [address] ?[address] ?[place] [address] ?Phone: [phone] Fax: [phone] ? ? ?Readmission  Risk Interventions ? ?  03/02/2021  ?  2:18 PM  ?Readmission Risk Prevention Plan  ?Medication Screening Complete  ?Transportation Screening Complete  ? ? ? ?

## 2021-05-11 NOTE — Care Management Obs Status (Signed)
MEDICARE OBSERVATION STATUS NOTIFICATION ? ? ?Patient Details  ?Name: Melissa Bentley ?MRN: [id number] ?Date of Birth: 09/28/1927 ? ? ?Medicare Observation Status Notification Given:  Yes ? ? ? ?Villa Herb, LCSWA ?05/11/2021, 10:32 PM ?

## 2021-05-11 NOTE — Progress Notes (Signed)
Initial Nutrition Assessment ? ?DOCUMENTATION CODES:  ? ?Non-severe (moderate) malnutrition in context of chronic illness ? ?INTERVENTION:  ?ProSource 30 ml BID  ? ?Ensure Enlive po BID, (prefers strawberry or vanilla flavor) ? ?NUTRITION DIAGNOSIS:  ? ?Moderate Malnutrition related to chronic illness (esphageal dysmotility, dementia) as evidenced by energy intake < 75% for > or equal to 1 month. ? ? ?GOAL:  ?Patient will meet greater than or equal to 90% of their needs (if feasible given her swallow problem, dementia and advanced age) ? ?MONITOR:  ?PO intake, Labs, Weight trends, Supplement acceptance ? ?REASON FOR ASSESSMENT:  ? ?Malnutrition Screening Tool ?  ? ?ASSESSMENT: Patient is a 88 yo female with history of CAD, GERD, Depression, MI, HTN who presents from [facility] (ALF) with confusion/agitation and facility staff reported dementia. Acute on chronic anemia- 1 unit PRBC, FTT, esophageal dysmotility per MD.  ? ?Patient diet advanced dysphagia 3. Patient is up in chair and picking at her meal. RD added butter, pepper and Ms Deliah Boston to her potatoes and cut up the beef for her but with little improvement in intake. Patient says, "I never been a big eater". Patient says she likes Ensure and ice cream. Offer to provide for her but she refused and said "maybe later".   ? ?Weight encounters reviewed. Currently 46.2 kg.  ? ?Medications: Folvite, Remeron (7.5 mg) ? ?IVF- NS@ 75 ml/hr. Stop this morning @ 0859.  ? ? ?  Latest Ref Rng & Units 05/11/2021  ?  3:11 AM 05/10/2021  ?  4:51 PM 04/20/2021  ?  5:11 AM  ?BMP  ?Glucose 70 - 99 mg/dL 82   124   88    ?BUN 8 - 23 mg/dL 29   36   23    ?Creatinine 0.44 - 1.00 mg/dL 0.80   0.78   0.83    ?Sodium 135 - 145 mmol/L 139   133   136    ?Potassium 3.5 - 5.1 mmol/L 3.9   4.3   4.6    ?Chloride 98 - 111 mmol/L 109   101   101    ?CO2 22 - 32 mmol/L 25   26   27     ?Calcium 8.9 - 10.3 mg/dL 9.3   9.5   9.3    ?   ? ?NUTRITION - FOCUSED PHYSICAL EXAM: ? ?Flowsheet Row Most  Recent Value  ?Orbital Region Mild depletion  ?Upper Arm Region Moderate depletion  ?Thoracic and Lumbar Region Mild depletion  ?Buccal Region Unable to assess  ?Temple Region Mild depletion  ?Clavicle Bone Region Mild depletion  ?Clavicle and Acromion Bone Region Moderate depletion  ?Scapular Bone Region Moderate depletion  ?Dorsal Hand Moderate depletion  ?Posterior Calf Region Mild depletion  ?Edema (RD Assessment) None  ?Hair Reviewed  ?Eyes Reviewed  ?Mouth Reviewed  ?Skin Reviewed  ?Nails Reviewed  ? ?  ? ? ?Diet Order:   ?Diet Order   ? ?       ?  DIET DYS 3 Room service appropriate? Yes; Fluid consistency: Thin  Diet effective now       ?  ? ?  ?  ? ?  ? ? ?EDUCATION NEEDS:  ?Not appropriate for education at this time ? ?Skin:  Skin Assessment: Reviewed RN Assessment ? ?Last BM:  prior to admission ? ?Height:  ? ?Ht Readings from Last 1 Encounters:  ?05/10/21 5' (1.524 m)  ? ? ?Weight:  ? ?Wt Readings from Last 1 Encounters:  ?  05/10/21 46.2 kg  ? ? ?Ideal Body Weight:   45 kg ? ?BMI:  Body mass index is 19.89 kg/m?. ? ?Estimated Nutritional Needs:  ? ?Kcal:  1300-1400 ? ?Protein:  60-65 gr ? ?Fluid:  >1200 ml daily ? ?Colman Cater MS,RD,CSG,LDN ?Contact: [facility]  ?

## 2021-05-11 NOTE — [place] FL2 (Signed)
? MEDICAID FL2 LEVEL OF CARE SCREENING TOOL  ?  ? ?IDENTIFICATION  ?Patient Name: ?Melissa Bentley Birthdate: 1927-03-17 Sex: female Admission Date (Current Location): ?05/10/2021  ?[place] Number: ?   ?  Facility and Address:  ?[facility],  [address], [place] [address] ?     Provider Number: ?[id number]  ?Attending Physician Name and Address:  ?Vassie Loll, MD ? Relative Name and Phone Number:  ?Hulen Shouts - son - [phone] ?   ?Current Level of Care: ?Hospital Recommended Level of Care: ?Assisted Living Facility Prior Approval Number: ?  ? ?Date Approved/Denied: ?  PASRR Number: ?[id number] A ? ?Discharge Plan: ?Domiciliary (Rest home) ?  ? ?Current Diagnoses: ?Patient Active Problem List  ? Diagnosis Date Noted  ? Acute GI bleeding 05/10/2021  ? Folic acid deficiency 03/06/2021  ? Chronic anemia 03/06/2021  ? Acquired hypothyroidism 03/06/2021  ? Recurrent depression (HCC) 03/06/2021  ? PVD (peripheral vascular disease) (HCC) 03/06/2021  ? Hypotension 03/05/2021  ? High anion gap metabolic acidosis 03/03/2021  ? Hyponatremia 03/03/2021  ? Hypokalemia, hypomagnesemia and hypophosphatemia 03/03/2021  ? Pancytopenia (HCC) 03/03/2021  ? Goals of care, counseling/discussion 03/03/2021  ? Esophageal dysmotility 03/03/2021  ? Physical deconditioning 03/03/2021  ? Failure to thrive due to dysphagia 03/01/2021  ? Hypoglycemia 03/01/2021  ? Protein-calorie malnutrition, severe (HCC) 03/01/2021  ? AKI (acute kidney injury) (HCC) 03/01/2021  ? Pressure injury of skin 10/05/2017  ? Subdural hematoma, chronic (HCC) 10/03/2017  ? Diarrhea   ? Diverticulosis of colon without hemorrhage   ? Chronic diarrhea 01/17/2015  ? ? ?Orientation RESPIRATION BLADDER Height & Weight   ?  ?Self, Situation, Place ? Normal Continent Weight: 46.2 kg ?Height:  5' (152.4 cm)  ?BEHAVIORAL SYMPTOMS/MOOD NEUROLOGICAL BOWEL NUTRITION STATUS  ?    Continent Diet (DYS 3)  ?AMBULATORY STATUS  COMMUNICATION OF NEEDS Skin   ?Extensive Assist Verbally Normal ?  ?  ?  ?    ?     ?     ? ? ?Personal Care Assistance Level of Assistance  ?Bathing, Dressing, Feeding Bathing Assistance: Limited assistance ?Feeding assistance: Limited assistance ?Dressing Assistance: Limited assistance ?   ? ?Functional Limitations Info  ?Sight, Hearing, Speech Sight Info: Adequate ?Hearing Info: Adequate ?Speech Info: Adequate  ? ? ?SPECIAL CARE FACTORS FREQUENCY  ?    ?  ?  ?  ?  ?  ?  ?   ? ? ?Contractures Contractures Info: Not present  ? ? ?Additional Factors Info  ?Code Status, Allergies Code Status Info: DNR ?Allergies Info: NKDA ?  ?  ?  ?   ? ?Current Medications (05/11/2021):  This is the current hospital active medication list ?Current Facility-Administered Medications  ?Medication Dose Route Frequency Provider Last Rate Last Admin  ? 0.9 %  sodium chloride infusion  250 mL Intravenous PRN Emokpae, Courage, MD      ? acetaminophen (TYLENOL) tablet 650 mg  650 mg Oral Q6H PRN Emokpae, Courage, MD      ? Or  ? acetaminophen (TYLENOL) suppository 650 mg  650 mg Rectal Q6H PRN Emokpae, Courage, MD      ? bisacodyl (DULCOLAX) suppository 10 mg  10 mg Rectal Daily PRN Emokpae, Courage, MD      ? DULoxetine (CYMBALTA) DR capsule 30 mg  30 mg Oral Daily Emokpae, Courage, MD   30 mg at 05/11/21 0856  ? folic acid (FOLVITE) tablet 1 mg  1 mg Oral Daily Emokpae, Courage,  MD   1 mg at 05/11/21 0856  ? isosorbide mononitrate (ISMO) tablet 5 mg  5 mg Oral TID AC Madueme, Elvira C, RPH   5 mg at 05/11/21 1227  ? levothyroxine (SYNTHROID) tablet 25 mcg  25 mcg Oral Daily Shon Hale, MD   25 mcg at 05/11/21 0543  ? LORazepam (ATIVAN) injection 0.5 mg  0.5 mg Intravenous Q12H PRN Emokpae, Courage, MD      ? metoprolol tartrate (LOPRESSOR) tablet 12.5 mg  12.5 mg Oral BID Mariea Clonts, Courage, MD   12.5 mg at 05/11/21 0854  ? midodrine (PROAMATINE) tablet 2.5 mg  2.5 mg Oral TID WC Emokpae, Courage, MD   2.5 mg at 05/11/21 1228  ?  mirtazapine (REMERON) tablet 7.5 mg  7.5 mg Oral QHS Emokpae, Courage, MD   7.5 mg at 05/10/21 2257  ? ondansetron (ZOFRAN) tablet 4 mg  4 mg Oral Q6H PRN Emokpae, Courage, MD      ? Or  ? ondansetron (ZOFRAN) injection 4 mg  4 mg Intravenous Q6H PRN Emokpae, Courage, MD      ? pantoprazole (PROTONIX) injection 40 mg  40 mg Intravenous Q12H Emokpae, Courage, MD   40 mg at 05/11/21 0856  ? polyethylene glycol (MIRALAX / GLYCOLAX) packet 17 g  17 g Oral Daily PRN Emokpae, Courage, MD      ? protein supplement (PROSOURCE NO CARB) liquid 30 mL  30 mL Oral BID Emokpae, Courage, MD      ? sodium chloride flush (NS) 0.9 % injection 3 mL  3 mL Intravenous Q12H Emokpae, Courage, MD      ? sodium chloride flush (NS) 0.9 % injection 3 mL  3 mL Intravenous Q12H Emokpae, Courage, MD   3 mL at 05/11/21 0857  ? sodium chloride flush (NS) 0.9 % injection 3 mL  3 mL Intravenous PRN Shon Hale, MD      ? ? ? ?Discharge Medications: ?Please see discharge summary for a list of discharge medications. ? ?Relevant Imaging Results: ? ?Relevant Lab Results: ? ? ?Additional Information ?SS# [id number] ? ?Leitha Bleak, RN ? ? ? ? ?

## 2021-05-11 NOTE — Progress Notes (Signed)
?Progress Note ? ? ?Patient: Melissa Bentley HCW:[id number] DOB: September 25, 1927 DOA: 05/10/2021     0 ?DOS: the patient was seen and examined on 05/11/2021 ?  ?Brief hospital course: ?As per H&P written by Dr. Mariea Clonts on 05/10/2021 ?Melissa Bentley  is a 88 y.o. female  with PMH of remote CAD/stent, HTN, HLD, hypothyroidism, GERD and remote subdural hematoma s/p evacuation , as well as history of dysphagia, with recent esophagram showing substantial esophageal dysmotility disorder concerning for presbyesophagus, epiphrenic diverticula or gastric diverticula, hiatal hernia and advanced dementia with sundowning presents to the ED from [facility] with fatigue, weakness and worsening behavioral problems ?-Family also reports weakness, fatigue and falls query dizziness leading to falls due to worsening anemia ?  ?-In the ED patient is found to have hemoglobin of 7.8, which is about 2 g below her recent baseline ?-Stool Hemoccult is positive and patient reports melanotic stools ?-Apparently her Protonix was stopped recently ?--Serum iron and ferritin are low ?-Sodium is 133, TSH WNL, LFTs WNL ?-CT head and chest x-ray without acute findings ? ?Assessment and Plan: ?* Acute GI bleeding ?- Appears to be secondary to upper GI bleed based on presentation symptoms. ?-S/p transfusion and after discussing with family decision to not pursued endoscopic evaluation and continue conservative management. ?-Continue PPI twice a day ?-Avoid the use of heparin products or any NSAIDs ?-Follow hemoglobin trend and transfuse for hemoglobin less than 7. ?-Continue supportive care. ? ?Protein-calorie malnutrition, moderate (HCC) ?- Appreciate assistance and recommendation by dietitian ?-Continue feeding supplements. ?-Body mass index is 19.89 kg/m?. ? ? ?Esophageal dysmotility ?- Continue liberalize diet as tolerated ?-Continue the use of isosorbide 5 mg 3 times a day before meals ?-Make sure patient is in upright position and following  soft diet and pur?ed meats. ? ?Hyponatremia ?- Continue to maintain adequate hydration and follow electrolytes trend. ? ? ? ? ? ? ?Subjective:  ?Chronically ill and underweight; no chest pain, no nausea, no vomiting.  Good saturation appreciated on room air.  Patient reports feeling weak and tired. ? ?Physical Exam: ?Vitals:  ? 05/11/21 0647 05/11/21 0854 05/11/21 0900 05/11/21 1322  ?BP: (!) 142/64  (!) 189/70 (!) 158/78  ?Pulse: (!) 59 62 (!) 57 64  ?Resp: 20  16 16   ?Temp: 97.8 ?F (36.6 ?C)  98 ?F (36.7 ?C) 98.2 ?F (36.8 ?C)  ?TempSrc: Oral  Oral Oral  ?SpO2: 95%  97% 99%  ?Weight:      ?Height:      ? ?General exam: Alert, awake, oriented x x2; able to follow simple commands.  No chest pain, no nausea or vomiting.  Patient reports no abdominal pain. ?Respiratory system: Clear to auscultation. Respiratory effort normal.  No using accessory muscle.  Good saturation on room air. ?Cardiovascular system:Rate controlled, no rubs or gallops. ?Gastrointestinal system: Abdomen is nondistended, soft and nontender. No organomegaly or masses felt. Normal bowel sounds heard. ?Central nervous system: Alert and oriented. No focal neurological deficits. ?Extremities: No cyanosis or clubbing. ?Skin: No petechiae. ?Psychiatry: Judgement and insight appear impaired secondary to underlying dementia. ?Data Reviewed: ?Status post 1 unit PRBC transfusion ?-Repeat hemoglobin 9.5 normal WBCs level and normal platelet count. ?Basic metabolic panel demonstrating sodium of 139, potassium 3.9, creatinine 0.80; BUN 29. ?MRSA PCR negative. ?INR 1.0 ?Urinalysis no demonstrating UTI. ? ?Family Communication: Son updated over the phone. ? ?Disposition: ?Status is: Observation ?The patient remains OBS appropriate and will d/c before 2 midnights. ? ? Planning to discharge to [facility] assisted  living facility on 05/14/2021. ? ?Author: ?Vassie Loll, MD ?05/11/2021 4:27 PM ? ?For on call review [email or web address].  ?

## 2021-05-11 NOTE — Consult Note (Addendum)
? ?Gastroenterology Consult  ? ?Referring Provider: No ref. provider found ?Primary Care Physician:  System, Provider Not In ?Primary Gastroenterologist:  Dr. Karilyn Cotaehman ? ?Patient ID: Melissa Bentley; [id number]; [birth year]  ? ?Admit date: 05/10/2021 ? LOS: 0 days  ? ?Date of Consultation: 05/11/2021 ? ?Reason for Consultation:  symptomatic anemia, upper GI bleed ? ?History of Present Illness  ? ?Melissa Bentley is a 88 y.o. year old female with history of depression, GERD, HLD, HTN, hypothyroid, osteoporosis, PVD, CAD with stents, MI, and remote subdural hematoma status postevacuation and advanced dementia with sundowning who presented to the ED from her assisted living facility with fatigue, weakness, and worsening agitation.  Family reported recent falls and wondered if dizziness has been causing falls due to her worsening anemia.  It was reported that her Protonix was recently stopped. ? ?Patient was admitted on 03/01/2021 due to poor p.o. intake, weight loss, lethargy and dysphagia.  During this admission she went underwent EGD  ?Showing abnormal esophageal motility, suspicious for esophageal spasm, 6 cm hiatal hernia, gastric antral vascular ectasia without bleeding, normal duodenum and possible food in the esophagus.  She was started on isosorbide 5 mg 3 times daily before meals with recommendation for pur?ed diet and to remain on PPI daily.  She also underwent barium swallow with findings of substantial esophageal dysmotility, corkscrew appearance of the esophagus compatible with presbyesophagus, 2-3 outpouchings near the GE junction, hiatal hernia/malignancy cannot be excluded, and filling defect along the luminal margins of upper thoracic esophagus.  She was also found to be anemic (Hgb 11.4, dropped to 9) which was likely multifactorial with normal ferritin, iron 38, saturation 10, TIBC 365 and folate deficiency with folate 5.8 it was not have any overt GI bleeding at this time.  Hemoglobin 1 week after  discharge was 10.4. ? ?She was seen outpatient on 03/12/2021 at which time she reported no further issues with her swallowing, however stated that she gets the same foods every day from the nursing facility and is not very appetizing stated she does not like to eat very much of it.  Stated feelings of being hungry but not overtly.  She had reported that she was post to get Ensure shakes which she does not always get.  She does not have teeth in the maxilla hard for her to eat.  She denied any melena or hematochezia, shortness of breath, dizziness at this visit, however she did state she has some intermittent fatigue and felt like that it was too to most recent blood draw and going to appointments.  He was advised to increase Ensure shakes to 3 times daily, continue isosorbide 5 mg 3 times daily, colonoscopy discussed however felt like risk with outweigh benefits for proceeding further evaluation of anemia. ? ?Last colonoscopy 02/01/2015 -normal-appearing rectal mucosa, densely populated colonic diverticula from the rectosigmoid junction all the way to the cecum, biopsies taken in the ascending and descending/sigmoid to evaluate for microscopic colitis which was negative ? ?ED course: ?Hgb 7.8 (had improved to 11.4 after recent discharge from hospital) ?Stool Hemoccult positive (reported melanotic stools) ?Iron 12, TIBC 485, saturation 2%, ferritin 10, TSH 3.036, LDH 143, NA 133 ?LFTs normal ?CT head and CXR negative ? ?Consult: ?Spoke to patient.  She was very tearful stating that she feels like her life does not have any purpose right now.  She has been out of the facility and has not had much visitors she says.  She has not had much of an appetite  to eat at the facility as the food does not taste like what she is used to eating and she prefers good home cooking and comfort food.  She reports the food does not look like how Food should look.  She stated that she feels like she does not know what been going on she has  been unable to go to the grocery store herself in the last couple of years and unable to drive her car and she is very upset about her loss of independence.  ? ?Spoke to patient's son " [name]" who reported that her dementia has been progressing especially since her last procedure.  She spent a few weeks up in nursing facility before she went to her assisted living facility.  She has had worsening agitation and memory issues and frequent falls.  I expressed to him that there was report that she may have not been getting her Protonix that she was prescribed after discharge and he is unaware if she is getting it or not.  He himself is hospitalized with his all medical issues at this time and would like to discuss with his siblings and other family members regarding their feelings about her undergoing another procedure. ? ? ?Past Medical History:  ?Diagnosis Date  ? CAD (coronary artery disease)   ? Depression   ? GERD (gastroesophageal reflux disease)   ? Hyperlipidemia   ? Hypertension   ? Hypothyroidism   ? MI (myocardial infarction) (HCC)   ? Osteoporosis   ? Psoriasis   ? PVD (peripheral vascular disease) (HCC)   ? ? ?Past Surgical History:  ?Procedure Laterality Date  ? ABDOMINAL HYSTERECTOMY    ? CATARACT EXTRACTION Bilateral   ? COLONOSCOPY N/A 02/01/2015  ? Dr. Rourk:colonic diverticulosis s/p biopsy with unremarkable colonic mucosa, no microscopic colitis.   ? CORONARY ANGIOPLASTY WITH STENT PLACEMENT    ? CRANIOTOMY Right 10/03/2017  ? Procedure: CRANIOTOMY HEMATOMA EVACUATION SUBDURAL;  Surgeon: Coletta Memos, MD;  Location: [facility] OR;  Service: Neurosurgery;  Laterality: Right;  ? CRANIOTOMY Right 10/05/2017  ? Procedure: REDO CRANIOTOMY HEMATOMA EVACUATION SUBDURAL;  Surgeon: Coletta Memos, MD;  Location: [facility] OR;  Service: Neurosurgery;  Laterality: Right;  ? ESOPHAGEAL DILATION  03/03/2021  ? Procedure: ESOPHAGEAL DILATION;  Surgeon: Malissa Hippo, MD;  Location: [facility] ENDO SUITE;  Service: Endoscopy;;  ?  ESOPHAGOGASTRODUODENOSCOPY (EGD) WITH PROPOFOL N/A 03/03/2021  ? Procedure: ESOPHAGOGASTRODUODENOSCOPY (EGD) WITH PROPOFOL;  Surgeon: Malissa Hippo, MD;  Location: [facility] ENDO SUITE;  Service: Endoscopy;  Laterality: N/A;  ? EYE SURGERY    ? LUNG LOBECTOMY    ? SUBCLAVIAN ARTERY STENT    ? TUBAL LIGATION    ? ? ?Prior to Admission medications   ?Medication Sig Start Date End Date Taking? Authorizing Provider  ?acetaminophen (TYLENOL) 500 MG tablet Take 500 mg by mouth every 4 (four) hours as needed for mild pain, fever or headache.   Yes [provider]  ?alum & mag hydroxide-simeth (MAALOX/MYLANTA) 200-200-20 MG/5ML suspension Take 30 mLs by mouth every 6 (six) hours as needed for indigestion or heartburn.   Yes [provider]  ?Amino Acids-Protein Hydrolys (FEEDING SUPPLEMENT, PRO-STAT SUGAR FREE 64,) LIQD Take 30 mLs by mouth 2 (two) times daily.   Yes [provider]  ?aspirin 81 MG tablet Take 1 tablet (81 mg total) by mouth daily. Reported on 04/27/2015 03/12/21  Yes Almon Hercules, MD  ?clonazePAM (KLONOPIN) 0.5 MG tablet Take 0.25 mg by mouth 2 (two) times daily  as needed for anxiety.   Yes [provider]  ?DULoxetine (CYMBALTA) 30 MG capsule Take 30 mg by mouth daily. 02/13/21  Yes [provider]  ?folic acid (FOLVITE) 1 MG tablet Take 1 tablet (1 mg total) by mouth daily. 03/06/21  Yes Almon Hercules, MD  ?guaiFENesin (ROBITUSSIN) 100 MG/5ML liquid Take 5 mLs by mouth every 6 (six) hours as needed for cough or to loosen phlegm.   Yes [provider]  ?isosorbide mononitrate (ISMO) 10 MG tablet Take 0.5 tablets (5 mg total) by mouth 3 (three) times daily before meals. 03/05/21  Yes Almon Hercules, MD  ?levothyroxine (SYNTHROID) 50 MCG tablet Take 25 mcg by mouth daily. 07/01/18  Yes [provider]  ?metoprolol tartrate (LOPRESSOR) 25 MG tablet Take 0.5 tablets (12.5 mg total) by mouth 2 (two) times daily. 03/05/21  Yes Almon Hercules, MD  ?midodrine  (PROAMATINE) 2.5 MG tablet Take 1 tablet (2.5 mg total) by mouth 3 (three) times daily with meals. 03/05/21  Yes Almon Hercules, MD  ?neomycin-bacitracin-polymyxin (NEOSPORIN) ointment Apply 1 application. topically 2 (two) tim

## 2021-05-12 DIAGNOSIS — F03911 Unspecified dementia, unspecified severity, with agitation: Secondary | ICD-10-CM | POA: Diagnosis not present

## 2021-05-12 DIAGNOSIS — D649 Anemia, unspecified: Secondary | ICD-10-CM | POA: Diagnosis not present

## 2021-05-12 DIAGNOSIS — K922 Gastrointestinal hemorrhage, unspecified: Secondary | ICD-10-CM | POA: Diagnosis not present

## 2021-05-12 DIAGNOSIS — E039 Hypothyroidism, unspecified: Secondary | ICD-10-CM | POA: Diagnosis not present

## 2021-05-12 DIAGNOSIS — K224 Dyskinesia of esophagus: Secondary | ICD-10-CM | POA: Diagnosis not present

## 2021-05-12 LAB — CBC
HCT: 30.9 % — ABNORMAL LOW (ref 36.0–46.0)
Hemoglobin: 9.6 g/dL — ABNORMAL LOW (ref 12.0–15.0)
MCH: 24.9 pg — ABNORMAL LOW (ref 26.0–34.0)
MCHC: 31.1 g/dL (ref 30.0–36.0)
MCV: 80.3 fL (ref 80.0–100.0)
Platelets: 186 10*3/uL (ref 150–400)
RBC: 3.85 MIL/uL — ABNORMAL LOW (ref 3.87–5.11)
RDW: 16 % — ABNORMAL HIGH (ref 11.5–15.5)
WBC: 4.1 10*3/uL (ref 4.0–10.5)
nRBC: 0 % (ref 0.0–0.2)

## 2021-05-12 NOTE — Progress Notes (Signed)
?Progress Note ? ? ?Patient: Melissa Bentley YDX:[id number] DOB: July 02, 1927 DOA: 05/10/2021     0 ?DOS: the patient was seen and examined on 05/12/2021 ?  ?Brief hospital course: ?As per H&P written by Dr. Mariea Clonts on 05/10/2021 ?Myana Schlup  is a 88 y.o. female  with PMH of remote CAD/stent, HTN, HLD, hypothyroidism, GERD and remote subdural hematoma s/p evacuation , as well as history of dysphagia, with recent esophagram showing substantial esophageal dysmotility disorder concerning for presbyesophagus, epiphrenic diverticula or gastric diverticula, hiatal hernia and advanced dementia with sundowning presents [facility] the ED [facility] with fatigue, weakness and worsening behavioral problems ?-Family also reports weakness, fatigue and falls query dizziness leading to falls due to worsening anemia ?  ?-In the ED patient is found to have hemoglobin of 7.8, which is about 2 g below her recent baseline ?-Stool Hemoccult is positive and patient reports melanotic stools ?-Apparently her Protonix was stopped recently ?--Serum iron and ferritin are low ?-Sodium is 133, TSH WNL, LFTs WNL ?-CT head and chest x-ray without acute findings ? ?Assessment and Plan: ?* Acute GI bleeding ?- Appears to be secondary to upper GI bleed based on presentation symptoms. ?-S/p transfusion and after discussing with family decision to not pursued endoscopic evaluation and continue conservative management. ?-Continue PPI twice a day ?-Avoid the use of heparin products or any NSAIDs ?-Follow hemoglobin trend and transfuse for hemoglobin less than 7. ?-Continue supportive care. ?-Hemoglobin 9.6 currently ? ?Protein-calorie malnutrition, moderate (HCC) ?- Appreciate assistance and recommendation by dietitian ?-Continue feeding supplements. ?-Body mass index is 19.89 kg/m?. ? ? ?Hypotension ?-Stable vital signs appreciated after transfusion ?-Blood pressure in the 130s to 150s systolic. ?-Continue monitoring. ? ?Esophageal dysmotility ?-  Continue liberalize diet as tolerated ?-Continue the use of isosorbide 5 mg 3 times a day before meals ?-Make sure patient is in upright position and following soft diet and pur?ed meats. ? ?Hyponatremia ?- Continue to maintain adequate hydration and follow electrolytes trend. ? ? ? ?Subjective:  ?Chronically ill in appearance; reports no chest pain, no nausea or vomiting.  No overt bleeding overnight. ? ?Physical Exam: ?Vitals:  ? 05/11/21 1322 05/11/21 2105 05/12/21 0555 05/12/21 1330  ?BP: (!) 158/78 (!) 152/70 (!) 158/74 138/76  ?Pulse: 64 (!) 58 (!) 56 69  ?Resp: 16 16 16 18   ?Temp: 98.2 ?F (36.8 ?C) 97.9 ?F (36.6 ?C) 98.2 ?F (36.8 ?C) 97.6 ?F (36.4 ?C)  ?TempSrc: Oral Oral Oral Oral  ?SpO2: 99% 96% 99% 99%  ?Weight:      ?Height:      ? ?General exam: Alert, awake, oriented x 2; intermittently confused; in no acute distress and appears to be comfortable.  Reports no nausea, no vomiting, no chest pain. ?Respiratory system: Clear to auscultation. Respiratory effort normal.  Good saturation on room air. ?Cardiovascular system:Rate controlled, no rubs, no gallops, no JVD on exam. ?Gastrointestinal system: Abdomen is nondistended, soft and nontender. No organomegaly or masses felt. Normal bowel sounds heard. ?Central nervous system: Alert and oriented. No focal neurological deficits. ?Extremities: No cyanosis or clubbing. ?Skin: No petechiae. ?Psychiatry: Judgement and insight impaired secondary to underlying dementia.  Stable mood. ? ?Data Reviewed: ?Status post 1 unit PRBC transfusion on 05/10/2021. ?-CBC demonstrating hemoglobin of 9.6, WBCs 4.1, platelet count 186 K ?Pertinent blood work from 05/11/2021 ?Basic metabolic panel demonstrating sodium of 139, potassium 3.9, creatinine 0.80; BUN 29. ?MRSA PCR negative. ?INR 1.0 ?Urinalysis no demonstrating UTI. ? ?Family Communication: Son updated over the phone. ? ?  Disposition: ?Status is: Observation ?The patient remains OBS appropriate and will d/c before 2  midnights. ? ? Planning to discharge to [facility] facility on 05/14/2021. ? ?Author: ?Vassie Loll, MD ?05/12/2021 4:47 PM ? ?For on call review [email or web address]  ?

## 2021-05-12 NOTE — Assessment & Plan Note (Signed)
-  Stable vital signs appreciated after transfusion ?-Blood pressure in the 130s to 150s systolic. ?-Continue monitoring. ?

## 2021-05-13 DIAGNOSIS — K224 Dyskinesia of esophagus: Secondary | ICD-10-CM | POA: Diagnosis not present

## 2021-05-13 DIAGNOSIS — K922 Gastrointestinal hemorrhage, unspecified: Secondary | ICD-10-CM | POA: Diagnosis not present

## 2021-05-13 DIAGNOSIS — F03911 Unspecified dementia, unspecified severity, with agitation: Secondary | ICD-10-CM | POA: Diagnosis not present

## 2021-05-13 DIAGNOSIS — E039 Hypothyroidism, unspecified: Secondary | ICD-10-CM | POA: Diagnosis not present

## 2021-05-13 DIAGNOSIS — D649 Anemia, unspecified: Secondary | ICD-10-CM | POA: Diagnosis not present

## 2021-05-13 NOTE — Progress Notes (Signed)
?Progress Note ? ? ?Patient: Melissa Bentley [id number]:[id number] DOB: August 22, 1927 DOA: 05/10/2021     0 ?DOS: the patient was seen and examined on 05/13/2021 ?  ?Brief hospital course: ?As per H&P written by Dr. Mariea Clonts on 05/10/2021 ?Melissa Bentley  is a 88 y.o. female  with PMH of remote CAD/stent, HTN, HLD, hypothyroidism, GERD and remote subdural hematoma s/p evacuation , as well as history of dysphagia, with recent esophagram showing substantial esophageal dysmotility disorder concerning for presbyesophagus, epiphrenic diverticula or gastric diverticula, hiatal hernia and advanced dementia with sundowning presents to the ED from [facility] with fatigue, weakness and worsening behavioral problems ?-Family also reports weakness, fatigue and falls query dizziness leading to falls due to worsening anemia ?  ?-In the ED patient is found to have hemoglobin of 7.8, which is about 2 g below her recent baseline ?-Stool Hemoccult is positive and patient reports melanotic stools ?-Apparently her Protonix was stopped recently ?--Serum iron and ferritin are low ?-Sodium is 133, TSH WNL, LFTs WNL ?-CT head and chest x-ray without acute findings ? ?05/13/21: Medically stable and with anticipation to be discharge to [facility] assisted facility on 05/14/2021.  No overt bleeding.  No overnight events. ? ?Assessment and Plan: ?* Acute GI bleeding ?- Appears to be secondary to upper GI bleed based on presentation symptoms. ?-S/p transfusion and after discussing with family decision to not pursued endoscopic evaluation and continue conservative management. ?-Continue PPI twice a day ?-Avoid the use of heparin products or any NSAIDs ?-Follow hemoglobin trend and transfuse for hemoglobin less than 7. ?-Continue supportive care. ?-Hemoglobin 9.6 currently ? ?Protein-calorie malnutrition, moderate (HCC) ?- Appreciate assistance and recommendation by dietitian ?-Continue feeding supplements. ?-Body mass index is 19.89  kg/m?. ? ? ?Hypotension ?-Stable vital signs appreciated after transfusion ?-Blood pressure in the 130s to 150s systolic. ?-Continue monitoring. ? ?Esophageal dysmotility ?- Continue liberalize diet as tolerated ?-Continue the use of isosorbide 5 mg 3 times a day before meals ?-Make sure patient is in upright position and following soft diet and pur?ed meats. ? ?Hyponatremia ?- Continue to maintain adequate hydration and follow electrolytes trend. ? ? ? ?Subjective:  ?No overt bleeding.  Hemodynamically stable and in no acute distress. ? ?Physical Exam: ?Vitals:  ? 05/13/21 0502 05/13/21 0510 05/13/21 1962 05/13/21 1445  ?BP:  (!) 164/73  (!) 141/71  ?Pulse: 89 (!) 52 66 (!) 55  ?Resp:  18  16  ?Temp:  98.1 ?F (36.7 ?C)  98.1 ?F (36.7 ?C)  ?TempSrc:  Oral    ?SpO2: 99% 97%  98%  ?Weight:      ?Height:      ? ?General exam: Alert, awake, oriented x 2; in no acute distress; chronically ill and underweight in appearance; patient is deconditioned and weak.  No overt bleeding. ?Respiratory system: Clear to auscultation. Respiratory effort normal.  Good saturation on room air. ?Cardiovascular system: Rate controlled, no rubs or gallops. ?Gastrointestinal system: Abdomen is nondistended, soft and nontender. No organomegaly or masses felt. Normal bowel sounds heard. ?Central nervous system: Alert and oriented. No focal neurological deficits. ?Extremities: No cyanosis or clubbing. ?Skin: No rashes, lesions or ulcers ?Psychiatry: Judgement and insight appear impaired secondary to underlying dementia; stable mood.  No suicidal ideation or hallucinations. ? ?Data Reviewed: ?Status post 1 unit PRBC transfusion on 05/10/2021. ?Pertinent blood work from 05/11/2021 ?Basic metabolic panel demonstrating sodium of 139, potassium 3.9, creatinine 0.80; BUN 29. ?MRSA PCR negative. ?INR 1.0 ?Urinalysis no demonstrating UTI. ?New blood work  has been ordered and will be done on 05/14/2021. ? ?Family Communication: Son updated over the  phone. ? ?Disposition: ?Status is: Observation ?The patient remains OBS appropriate and will d/c before 2 midnights. ? ? Planning to discharge to [facility] assisted living facility on 05/14/2021. ? ?Author: ?Vassie Loll, MD ?05/13/2021 4:47 PM ? ?For on call review [email or web address].  ?

## 2021-05-14 DIAGNOSIS — K224 Dyskinesia of esophagus: Secondary | ICD-10-CM | POA: Diagnosis not present

## 2021-05-14 DIAGNOSIS — E871 Hypo-osmolality and hyponatremia: Secondary | ICD-10-CM | POA: Diagnosis not present

## 2021-05-14 DIAGNOSIS — E039 Hypothyroidism, unspecified: Secondary | ICD-10-CM | POA: Diagnosis not present

## 2021-05-14 DIAGNOSIS — E440 Moderate protein-calorie malnutrition: Secondary | ICD-10-CM | POA: Diagnosis not present

## 2021-05-14 DIAGNOSIS — D649 Anemia, unspecified: Secondary | ICD-10-CM | POA: Diagnosis not present

## 2021-05-14 DIAGNOSIS — K922 Gastrointestinal hemorrhage, unspecified: Secondary | ICD-10-CM | POA: Diagnosis not present

## 2021-05-14 DIAGNOSIS — F03911 Unspecified dementia, unspecified severity, with agitation: Secondary | ICD-10-CM

## 2021-05-14 LAB — CBC
HCT: 31.7 % — ABNORMAL LOW (ref 36.0–46.0)
Hemoglobin: 9.6 g/dL — ABNORMAL LOW (ref 12.0–15.0)
MCH: 24.6 pg — ABNORMAL LOW (ref 26.0–34.0)
MCHC: 30.3 g/dL (ref 30.0–36.0)
MCV: 81.1 fL (ref 80.0–100.0)
Platelets: 194 10*3/uL (ref 150–400)
RBC: 3.91 MIL/uL (ref 3.87–5.11)
RDW: 16.1 % — ABNORMAL HIGH (ref 11.5–15.5)
WBC: 4.9 10*3/uL (ref 4.0–10.5)
nRBC: 0 % (ref 0.0–0.2)

## 2021-05-14 MED ORDER — MIRTAZAPINE 7.5 MG PO TABS: 7.5000 mg | ORAL_TABLET | Freq: Every day | ORAL | 1 refills | Status: DC

## 2021-05-14 MED ORDER — ONDANSETRON 8 MG PO TBDP: 4.0000 mg | ORAL_TABLET | Freq: Three times a day (TID) | ORAL | 0 refills | Status: DC | PRN

## 2021-05-14 MED ORDER — TUBERCULIN PPD 5 UNIT/0.1ML ID SOLN
5.0000 [IU] | Freq: Once | INTRADERMAL | Status: DC
  Filled 2021-05-14: qty 0.1

## 2021-05-14 MED ORDER — ENSURE ENLIVE PO LIQD: 237.0000 mL | Freq: Two times a day (BID) | ORAL | Status: DC

## 2021-05-14 MED ORDER — PANTOPRAZOLE SODIUM 40 MG PO TBEC: 40.0000 mg | DELAYED_RELEASE_TABLET | Freq: Two times a day (BID) | ORAL | 2 refills | Status: DC

## 2021-05-14 MED ORDER — CLONAZEPAM 0.5 MG PO TABS: 0.2500 mg | ORAL_TABLET | Freq: Two times a day (BID) | ORAL | 0 refills | Status: DC | PRN

## 2021-05-14 NOTE — Discharge Summary (Signed)
?Physician Discharge Summary ?  ?Patient: Melissa Bentley MRN: [id number] DOB: 05/01/1927  ?Admit date:     05/10/2021  ?Discharge date: 05/14/21  ?Discharge Physician: Vassie LollCarlos Yurika Pereda  ? ?PCP: System, Provider Not In  ? ?Recommendations at discharge:  ?Repeat CBC to follow Hgb trend. ?Continue to follow clinical response and further discuss advance directives for initiation of comfort management. ? ?Discharge Diagnoses: ?Principal Problem: ?  Upper GI bleed ?Active Problems: ?  Hyponatremia ?  Symptomatic anemia ?  Esophageal dysmotility ?  Hypotension ?  Acquired hypothyroidism ?  Protein-calorie malnutrition, moderate (HCC) ?  Dementia with agitation (HCC) ? ?Brief hospital course: ?As per H&P written by Dr. Mariea ClontsEmokpae on 05/10/2021 ?Melissa Bentley  is a 88 y.o. female  with PMH of remote CAD/stent, HTN, HLD, hypothyroidism, GERD and remote subdural hematoma s/p evacuation , as well as history of dysphagia, with recent esophagram showing substantial esophageal dysmotility disorder concerning for presbyesophagus, epiphrenic diverticula or gastric diverticula, hiatal hernia and advanced dementia with sundowning presents to the ED from [facility] with fatigue, weakness and worsening behavioral problems ?-Family also reports weakness, fatigue and falls query dizziness leading to falls due to worsening anemia ?  ?-In the ED patient is found to have hemoglobin of 7.8, which is about 2 g below her recent baseline ?-Stool Hemoccult is positive and patient reports melanotic stools ?-Apparently her Protonix was stopped recently ?--Serum iron and ferritin are low ?-Sodium is 133, TSH WNL, LFTs WNL ?-CT head and chest x-ray without acute findings ?  ?05/13/21: Medically stable and with anticipation to be discharge to [facility] assisted facility on 05/14/2021.  No overt bleeding.  No overnight events. ? ?Assessment and Plan: ?* Upper GI bleed ?- Appears to be secondary to upper GI bleed based on presentation symptoms. ?-S/p  transfusion and after discussing with family decision to not pursued endoscopic evaluation and continue conservative management. ?-Continue PPI twice a day ?-Follow hemoglobin trend and transfuse for hemoglobin less than 7. ?-Aspirin discontinue; avoid NSAID's. ?-Hemoglobin 9.6 currently ? ?Protein-calorie malnutrition, moderate (HCC) ?- Appreciate assistance and recommendation by dietitian ?-Continue feeding supplements. ?-Body mass index is 19.89 kg/m?. ? ? ?Hypotension ?-Stable vital signs appreciated after transfusion ?-Blood pressure in the 130s to 150s systolic. ?-Continue monitoring. ? ?Esophageal dysmotility ?- Continue liberalize diet as tolerated ?-Continue the use of isosorbide 5 mg 3 times a day before meals ?-Make sure patient is in upright position and following soft diet and pur?ed meats. ? ?Hyponatremia ?- Continue to maintain adequate hydration and follow electrolytes trend. ? ? ?Consultants: GI service. ?Procedures performed: none   ?Disposition: Assisted living ? ?Diet recommendation:  ?Discharge Diet Orders (From admission, onward)  ? ?  Start     Ordered  ? 05/14/21 0000  Diet - low sodium heart healthy       ? 05/14/21 1208  ? ?  ?  ? ?  ? ?DISCHARGE MEDICATION: ?Allergies as of 05/14/2021   ?No Known Allergies ?  ? ?  ?Medication List  ?  ? ?STOP taking these medications   ? ?aspirin 81 MG tablet ?  ?loperamide HCl 2 MG/15ML solution ?Commonly known as: IMODIUM ?  ?neomycin-bacitracin-polymyxin ointment ?Commonly known as: NEOSPORIN ?  ?traZODone 50 MG tablet ?Commonly known as: DESYREL ?  ? ?  ? ?TAKE these medications   ? ?acetaminophen 500 MG tablet ?Commonly known as: TYLENOL ?Take 500 mg by mouth every 4 (four) hours as needed for mild pain, fever or headache. ?What changed:  Another medication with the same name was removed. Continue taking this medication, and follow the directions you see here. ?  ?alum & mag hydroxide-simeth 200-200-20 MG/5ML suspension ?Commonly known as:  MAALOX/MYLANTA ?Take 30 mLs by mouth every 6 (six) hours as needed for indigestion or heartburn. ?  ?clonazePAM 0.5 MG tablet ?Commonly known as: KLONOPIN ?Take 0.5 tablets (0.25 mg total) by mouth every 12 (twelve) hours as needed for anxiety. ?What changed: when to take this ?  ?DULoxetine 30 MG capsule ?Commonly known as: CYMBALTA ?Take 30 mg by mouth daily. ?  ?feeding supplement (PRO-STAT SUGAR FREE 64) Liqd ?Take 30 mLs by mouth 2 (two) times daily. ?  ?feeding supplement Liqd ?Take 237 mLs by mouth 2 (two) times daily between meals. ?  ?folic acid 1 MG tablet ?Commonly known as: FOLVITE ?Take 1 tablet (1 mg total) by mouth daily. ?  ?guaiFENesin 100 MG/5ML liquid ?Commonly known as: ROBITUSSIN ?Take 5 mLs by mouth every 6 (six) hours as needed for cough or to loosen phlegm. ?  ?isosorbide mononitrate 10 MG tablet ?Commonly known as: ISMO ?Take 0.5 tablets (5 mg total) by mouth 3 (three) times daily before meals. ?  ?levothyroxine 50 MCG tablet ?Commonly known as: SYNTHROID ?Take 25 mcg by mouth daily. ?  ?metoprolol tartrate 25 MG tablet ?Commonly known as: LOPRESSOR ?Take 0.5 tablets (12.5 mg total) by mouth 2 (two) times daily. ?  ?midodrine 2.5 MG tablet ?Commonly known as: PROAMATINE ?Take 1 tablet (2.5 mg total) by mouth 3 (three) times daily with meals. ?  ?mirtazapine 7.5 MG tablet ?Commonly known as: REMERON ?Take 1 tablet (7.5 mg total) by mouth at bedtime. ?  ?ondansetron 8 MG disintegrating tablet ?Commonly known as: ZOFRAN-ODT ?Take 0.5 tablets (4 mg total) by mouth every 8 (eight) hours as needed for nausea or vomiting. ?What changed: medication strength ?  ?pantoprazole 40 MG tablet ?Commonly known as: PROTONIX ?Take 1 tablet (40 mg total) by mouth 2 (two) times daily. ?What changed: when to take this ?  ?Venelex Oint ?Apply 1 application topically as directed. Apply to coccyx, sacrum and bilateral buttocks every shift. ?  ? ?  ? ? Follow-up Information   ? ? Laqueta Linden, MD. Schedule  an appointment as soon as possible for a visit in 10 day(s).   ?Specialty: Cardiology ?Contact information: ?[address] ?[address] ?[place] [address] ?[phone] ? ? ?  ?  ? ?  ?  ? ?  ? ?Discharge Exam: ?Filed Weights  ? 05/10/21 1435 05/10/21 2001  ?Weight: 40 kg 46.2 kg  ? ?General exam: Alert, awake, oriented x 2; in no acute distress; chronically ill and underweight in appearance; patient is deconditioned and weak.  No overt bleeding. ?Respiratory system: Clear to auscultation. Respiratory effort normal.  Good saturation on room air. ?Cardiovascular system: Rate controlled, no rubs or gallops. ?Gastrointestinal system: Abdomen is nondistended, soft and nontender. No organomegaly or masses felt. Normal bowel sounds heard. ?Central nervous system: Alert and oriented. No focal neurological deficits. ?Extremities: No cyanosis or clubbing. ?Skin: No rashes, lesions or ulcers ?Psychiatry: Judgement and insight appear impaired secondary to underlying dementia; stable mood.  No suicidal ideation or hallucinations. ? ?Condition at discharge: stable and improved. ? ?The results of significant diagnostics from this hospitalization (including imaging, microbiology, ancillary and laboratory) are listed below for reference.  ? ?Imaging Studies: ?DG Chest 1 View ? ?Result Date: 05/10/2021 ?CLINICAL DATA:  confusion and aggression towards staff Hx of CAD and  HTN EXAM: CHEST  1 VIEW COMPARISON:  03/01/2021 and previous FINDINGS: Surgical staple line at the left lung apex as before. Lungs are clear. Heart size upper limits normal for technique. Aortic Atherosclerosis (ICD10-170.0). No effusion. Vertebral endplate spurring at multiple levels in the mid and lower thoracic spine. IMPRESSION: No acute cardiopulmonary disease. Electronically Signed   By: Corlis Leak M.D.   On: 05/10/2021 16:44  ? ?DG Pelvis 1-2 Views ? ?Result Date: 04/20/2021 ?CLINICAL DATA:  Recent fall with pelvic pain, initial encounter EXAM: PELVIS - 1 VIEW  COMPARISON:  02/21/2021 CT FINDINGS: Pelvic ring is intact. Healing superior and inferior pubic rami fractures are noted on the left. No proximal femoral fracture is seen. Vascular calcifications are noted. IMPRESSION: H

## 2021-05-14 NOTE — Progress Notes (Signed)
Patient has been pleasantly confused this shift.  Patient has had a large bowel movement this shift. Patient has voiced or displayed no new issues. ?

## 2021-05-14 NOTE — [place] FL2 (Signed)
?[place] MEDICAID FL2 LEVEL OF CARE SCREENING TOOL  ?  ? ?IDENTIFICATION  ?Patient Name: ?RASHIYA LOFLAND Birthdate: 1927-06-14 Sex: female Admission Date (Current Location): ?05/10/2021  ?[place] Number: ? [place] ?  Facility and Address:  ?[facility],  [address], [place] [address] ?     Provider Number: ?[id number]  ?Attending Physician Name and Address:  ?Vassie Loll, MD ? Relative Name and Phone Number:  ?Hulen Shouts - son - [phone] ?   ?Current Level of Care: ?Hospital Recommended Level of Care: ?Assisted Living Facility Prior Approval Number: ?  ? ?Date Approved/Denied: ?  PASRR Number: ?[id number] A ? ?Discharge Plan: ?Domiciliary (Rest home) ?  ? ?Current Diagnoses: ?Patient Active Problem List  ? Diagnosis Date Noted  ? Dementia with agitation (HCC)   ? Protein-calorie malnutrition, moderate (HCC) 05/11/2021  ? Upper GI bleed 05/10/2021  ? Folic acid deficiency 03/06/2021  ? Chronic anemia 03/06/2021  ? Acquired hypothyroidism 03/06/2021  ? Recurrent depression (HCC) 03/06/2021  ? PVD (peripheral vascular disease) (HCC) 03/06/2021  ? Hypotension 03/05/2021  ? High anion gap metabolic acidosis 03/03/2021  ? Hyponatremia 03/03/2021  ? Hypokalemia, hypomagnesemia and hypophosphatemia 03/03/2021  ? Symptomatic anemia 03/03/2021  ? Goals of care, counseling/discussion 03/03/2021  ? Esophageal dysmotility 03/03/2021  ? Physical deconditioning 03/03/2021  ? Failure to thrive due to dysphagia 03/01/2021  ? Hypoglycemia 03/01/2021  ? Protein-calorie malnutrition, severe (HCC) 03/01/2021  ? AKI (acute kidney injury) (HCC) 03/01/2021  ? Pressure injury of skin 10/05/2017  ? Subdural hematoma, chronic (HCC) 10/03/2017  ? Diarrhea   ? Diverticulosis of colon without hemorrhage   ? Chronic diarrhea 01/17/2015  ? ? ?Orientation RESPIRATION BLADDER Height & Weight   ?  ?Self, Situation, Place ? Normal Incontinent Weight: 101 lb 13.6 oz (46.2 kg) ?Height:  5' (152.4 cm)   ?BEHAVIORAL SYMPTOMS/MOOD NEUROLOGICAL BOWEL NUTRITION STATUS  ?    Continent Diet (Low sodium heart healthy)  ?AMBULATORY STATUS COMMUNICATION OF NEEDS Skin   ?Extensive Assist Verbally Skin abrasions, Bruising ?  ?  ?  ?    ?     ?     ? ? ?Personal Care Assistance Level of Assistance  ?Bathing, Dressing, Feeding Bathing Assistance: Limited assistance ?Feeding assistance: Limited assistance ?Dressing Assistance: Limited assistance ?   ? ?Functional Limitations Info  ?Sight, Hearing, Speech Sight Info: Adequate ?Hearing Info: Adequate ?Speech Info: Adequate  ? ? ?SPECIAL CARE FACTORS FREQUENCY  ?    ?  ?  ?  ?  ?  ?  ?   ? ? ?Contractures Contractures Info: Not present  ? ? ?Additional Factors Info  ?Code Status, Psychotropic Code Status Info: DNR ?Allergies Info: NKDA ?Psychotropic Info: Klonopin, Cymbalta, Remeron ?  ?  ?   ? ?Current Medications (05/14/2021):  This is the current hospital active medication list ?Current Facility-Administered Medications  ?Medication Dose Route Frequency Provider Last Rate Last Admin  ? 0.9 %  sodium chloride infusion  250 mL Intravenous PRN Emokpae, Courage, MD      ? acetaminophen (TYLENOL) tablet 650 mg  650 mg Oral Q6H PRN Emokpae, Courage, MD      ? Or  ? acetaminophen (TYLENOL) suppository 650 mg  650 mg Rectal Q6H PRN Emokpae, Courage, MD      ? bisacodyl (DULCOLAX) suppository 10 mg  10 mg Rectal Daily PRN Emokpae, Courage, MD      ? DULoxetine (CYMBALTA) DR capsule 30 mg  30 mg Oral Daily Emokpae,  Courage, MD   30 mg at 05/14/21 0838  ? feeding supplement (ENSURE ENLIVE / ENSURE PLUS) liquid 237 mL  237 mL Oral BID BM Vassie LollMadera, Carlos, MD   237 mL at 05/14/21 1007  ? folic acid (FOLVITE) tablet 1 mg  1 mg Oral Daily Emokpae, Courage, MD   1 mg at 05/14/21 [id number]  ? isosorbide mononitrate (ISMO) tablet 5 mg  5 mg Oral TID AC Madueme, Elvira C, RPH   5 mg at 05/14/21 0837  ? levothyroxine (SYNTHROID) tablet 25 mcg  25 mcg Oral Daily Shon HaleEmokpae, Courage, MD   25 mcg at 05/14/21  0650  ? LORazepam (ATIVAN) injection 0.5 mg  0.5 mg Intravenous Q12H PRN Emokpae, Courage, MD      ? metoprolol tartrate (LOPRESSOR) tablet 12.5 mg  12.5 mg Oral BID Mariea ClontsEmokpae, Courage, MD   12.5 mg at 05/14/21 0838  ? midodrine (PROAMATINE) tablet 2.5 mg  2.5 mg Oral TID WC Emokpae, Courage, MD   2.5 mg at 05/14/21 0837  ? mirtazapine (REMERON) tablet 7.5 mg  7.5 mg Oral QHS Emokpae, Courage, MD   7.5 mg at 05/13/21 2149  ? ondansetron (ZOFRAN) tablet 4 mg  4 mg Oral Q6H PRN Emokpae, Courage, MD      ? Or  ? ondansetron (ZOFRAN) injection 4 mg  4 mg Intravenous Q6H PRN Emokpae, Courage, MD      ? pantoprazole (PROTONIX) injection 40 mg  40 mg Intravenous Q12H Emokpae, Courage, MD   40 mg at 05/13/21 2149  ? polyethylene glycol (MIRALAX / GLYCOLAX) packet 17 g  17 g Oral Daily PRN Emokpae, Courage, MD      ? protein supplement (PROSOURCE NO CARB) liquid 30 mL  30 mL Oral BID Emokpae, Courage, MD   30 mL at 05/13/21 1006  ? sodium chloride flush (NS) 0.9 % injection 3 mL  3 mL Intravenous Q12H Shon HaleEmokpae, Courage, MD   3 mL at 05/13/21 0824  ? sodium chloride flush (NS) 0.9 % injection 3 mL  3 mL Intravenous Q12H Mariea ClontsEmokpae, Courage, MD   3 mL at 05/14/21 0841  ? sodium chloride flush (NS) 0.9 % injection 3 mL  3 mL Intravenous PRN Shon HaleEmokpae, Courage, MD      ? tuberculin injection 5 Units  5 Units Intradermal Once Vassie LollMadera, Carlos, MD      ? ? ? ?Discharge Medications: ?TAKE these medications   ?  ?acetaminophen 500 MG tablet ?Commonly known as: TYLENOL ?Take 500 mg by mouth every 4 (four) hours as needed for mild pain, fever or headache. ?What changed: Another medication with the same name was removed. Continue taking this medication, and follow the directions you see here. ?   ?alum & mag hydroxide-simeth 200-200-20 MG/5ML suspension ?Commonly known as: MAALOX/MYLANTA ?Take 30 mLs by mouth every 6 (six) hours as needed for indigestion or heartburn. ?   ?clonazePAM 0.5 MG tablet ?Commonly known as: KLONOPIN ?Take 0.5 tablets  (0.25 mg total) by mouth every 12 (twelve) hours as needed for anxiety. ?What changed: when to take this ?   ?DULoxetine 30 MG capsule ?Commonly known as: CYMBALTA ?Take 30 mg by mouth daily. ?   ?feeding supplement (PRO-STAT SUGAR FREE 64) Liqd ?Take 30 mLs by mouth 2 (two) times daily. ?   ?feeding supplement Liqd ?Take 237 mLs by mouth 2 (two) times daily between meals. ?   ?folic acid 1 MG tablet ?Commonly known as: FOLVITE ?Take 1 tablet (1 mg total) by mouth daily. ?   ?  guaiFENesin 100 MG/5ML liquid ?Commonly known as: ROBITUSSIN ?Take 5 mLs by mouth every 6 (six) hours as needed for cough or to loosen phlegm. ?   ?isosorbide mononitrate 10 MG tablet ?Commonly known as: ISMO ?Take 0.5 tablets (5 mg total) by mouth 3 (three) times daily before meals. ?   ?levothyroxine 50 MCG tablet ?Commonly known as: SYNTHROID ?Take 25 mcg by mouth daily. ?   ?metoprolol tartrate 25 MG tablet ?Commonly known as: LOPRESSOR ?Take 0.5 tablets (12.5 mg total) by mouth 2 (two) times daily. ?   ?midodrine 2.5 MG tablet ?Commonly known as: PROAMATINE ?Take 1 tablet (2.5 mg total) by mouth 3 (three) times daily with meals. ?   ?mirtazapine 7.5 MG tablet ?Commonly known as: REMERON ?Take 1 tablet (7.5 mg total) by mouth at bedtime. ?   ?ondansetron 8 MG disintegrating tablet ?Commonly known as: ZOFRAN-ODT ?Take 0.5 tablets (4 mg total) by mouth every 8 (eight) hours as needed for nausea or vomiting. ?What changed: medication strength ?   ?pantoprazole 40 MG tablet ?Commonly known as: PROTONIX ?Take 1 tablet (40 mg total) by mouth 2 (two) times daily. ?What changed: when to take this ?   ?Venelex Oint ?Apply 1 application topically as directed. Apply to coccyx, sacrum and bilateral buttocks every shift.  ? ? ?Relevant Imaging Results: ? ?Relevant Lab Results: ? ? ?Additional Information ?SS# [id number]. TB skin test administered 05/14/21. ? ?Karn Cassis, LCSW ? ? ? ? ?

## 2021-05-14 NOTE — Progress Notes (Signed)
Nsg Discharge Note ? ?Admit Date:  05/10/2021 ?Discharge date: 05/14/2021 ?  ?Melissa Bentley to be D/C'd Nursing Home per MD order.  AVS completed.  Copy for chart, and copy for patient signed, and dated. ?Patient/caregiver able to verbalize understanding. ? ?Discharge Medication: ?Allergies as of 05/14/2021   ?No Known Allergies ?  ? ?  ?Medication List  ?  ? ?STOP taking these medications   ? ?aspirin 81 MG tablet ?  ?loperamide HCl 2 MG/15ML solution ?Commonly known as: IMODIUM ?  ?neomycin-bacitracin-polymyxin ointment ?Commonly known as: NEOSPORIN ?  ?traZODone 50 MG tablet ?Commonly known as: DESYREL ?  ? ?  ? ?TAKE these medications   ? ?acetaminophen 500 MG tablet ?Commonly known as: TYLENOL ?Take 500 mg by mouth every 4 (four) hours as needed for mild pain, fever or headache. ?What changed: Another medication with the same name was removed. Continue taking this medication, and follow the directions you see here. ?  ?alum & mag hydroxide-simeth 200-200-20 MG/5ML suspension ?Commonly known as: MAALOX/MYLANTA ?Take 30 mLs by mouth every 6 (six) hours as needed for indigestion or heartburn. ?  ?clonazePAM 0.5 MG tablet ?Commonly known as: KLONOPIN ?Take 0.5 tablets (0.25 mg total) by mouth every 12 (twelve) hours as needed for anxiety. ?What changed: when to take this ?  ?DULoxetine 30 MG capsule ?Commonly known as: CYMBALTA ?Take 30 mg by mouth daily. ?  ?feeding supplement (PRO-STAT SUGAR FREE 64) Liqd ?Take 30 mLs by mouth 2 (two) times daily. ?  ?feeding supplement Liqd ?Take 237 mLs by mouth 2 (two) times daily between meals. ?  ?folic acid 1 MG tablet ?Commonly known as: FOLVITE ?Take 1 tablet (1 mg total) by mouth daily. ?  ?guaiFENesin 100 MG/5ML liquid ?Commonly known as: ROBITUSSIN ?Take 5 mLs by mouth every 6 (six) hours as needed for cough or to loosen phlegm. ?  ?isosorbide mononitrate 10 MG tablet ?Commonly known as: ISMO ?Take 0.5 tablets (5 mg total) by mouth 3 (three) times daily before  meals. ?  ?levothyroxine 50 MCG tablet ?Commonly known as: SYNTHROID ?Take 25 mcg by mouth daily. ?  ?metoprolol tartrate 25 MG tablet ?Commonly known as: LOPRESSOR ?Take 0.5 tablets (12.5 mg total) by mouth 2 (two) times daily. ?  ?midodrine 2.5 MG tablet ?Commonly known as: PROAMATINE ?Take 1 tablet (2.5 mg total) by mouth 3 (three) times daily with meals. ?  ?mirtazapine 7.5 MG tablet ?Commonly known as: REMERON ?Take 1 tablet (7.5 mg total) by mouth at bedtime. ?  ?ondansetron 8 MG disintegrating tablet ?Commonly known as: ZOFRAN-ODT ?Take 0.5 tablets (4 mg total) by mouth every 8 (eight) hours as needed for nausea or vomiting. ?What changed: medication strength ?  ?pantoprazole 40 MG tablet ?Commonly known as: PROTONIX ?Take 1 tablet (40 mg total) by mouth 2 (two) times daily. ?What changed: when to take this ?  ?Venelex Oint ?Apply 1 application topically as directed. Apply to coccyx, sacrum and bilateral buttocks every shift. ?  ? ?  ? ? ?Discharge Assessment: ?Vitals:  ? 05/14/21 0513 05/14/21 1328  ?BP: (!) 145/57 (!) 148/93  ?Pulse: 64 73  ?Resp: 16 18  ?Temp: 97.6 ?F (36.4 ?C) 97.8 ?F (36.6 ?C)  ?SpO2: 97% 99%  ? Skin clean, dry and intact without evidence of skin break down, no evidence of skin tears noted. ?IV catheter discontinued intact. Site without signs and symptoms of complications - no redness or edema noted at insertion site, patient denies c/o pain - only slight  tenderness at site.  Dressing with slight pressure applied. ? ?D/c Instructions-Education: ?Discharge instructions given to patient/family with verbalized understanding. ?D/c education completed with patient/family including follow up instructions, medication list, d/c activities limitations if indicated, with other d/c instructions as indicated by MD - patient able to verbalize understanding, all questions fully answered. ?Patient instructed to return to ED, call 911, or call MD for any changes in condition.  ?Patient escorted via [place],  and D/C home via private auto. ? ?Clovis Fredrickson, LPN ?[phone] PM  ?

## 2021-05-14 NOTE — TOC Transition Note (Addendum)
Transition of Care (TOC) - CM/SW Discharge Note ? ? ?Patient Details  ?Name: JUSTIS ATEN ?MRN: [id number] ?Date of Birth: [birth year] ? ?Transition of Care (TOC) CM/SW Contact:  ?Salome Arnt, LCSW ?Phone Number: ?05/14/2021, 1:01 PM ? ? ?Clinical Narrative:  Pt d/c today to [facility]. [facility] Malta notified and will transport. RN and MD aware pt needs TB skin test administered prior to d/c. D/C summary and FL2 sent to facility. Discussed above with Sharyn Lull at [place]. [facility] also notified of d/c. LCSW confirmed with Anderson Malta that they will follow at [facility].     ? ? ? ?Final next level of care: Assisted Living ?Barriers to Discharge: Barriers Resolved ? ? ?Patient Goals and CMS Choice ?Patient states their goals for this hospitalization and ongoing recovery are:: ALF ?  ?  ? ?Discharge Placement ?  ?           ?  ?Patient to be transferred to facility by: family ?Name of family member notified: Anderson Malta ?Patient and family notified of of transfer: 05/14/21 ? ?Discharge Plan and Services ?  ?  ?           ?  ?  ?  ?  ?  ?  ?[place] Agency: [facility] ?Date [place]: 05/14/21 ?Time [place]: [id number] spoke with at [place]: [place] ? ?Social Determinants of Health (SDOH) Interventions ?  ? ? ?Readmission Risk Interventions ? ?  03/02/2021  ?  2:18 PM  ?Readmission Risk Prevention Plan  ?Medication Screening Complete  ?Transportation Screening Complete  ? ? ? ? ? ?

## 2021-05-19 DIAGNOSIS — M6281 Muscle weakness (generalized): Secondary | ICD-10-CM | POA: Diagnosis not present

## 2021-05-19 DIAGNOSIS — M810 Age-related osteoporosis without current pathological fracture: Secondary | ICD-10-CM | POA: Diagnosis not present

## 2021-05-19 DIAGNOSIS — I2510 Atherosclerotic heart disease of native coronary artery without angina pectoris: Secondary | ICD-10-CM | POA: Diagnosis not present

## 2021-05-29 DIAGNOSIS — N390 Urinary tract infection, site not specified: Secondary | ICD-10-CM | POA: Diagnosis not present

## 2021-05-29 DIAGNOSIS — B9561 Methicillin susceptible Staphylococcus aureus infection as the cause of diseases classified elsewhere: Secondary | ICD-10-CM | POA: Diagnosis not present

## 2021-05-29 DIAGNOSIS — B379 Candidiasis, unspecified: Secondary | ICD-10-CM | POA: Diagnosis not present

## 2021-06-11 ENCOUNTER — Encounter (INDEPENDENT_AMBULATORY_CARE_PROVIDER_SITE_OTHER): Payer: Self-pay | Admitting: Gastroenterology

## 2021-06-11 ENCOUNTER — Ambulatory Visit (INDEPENDENT_AMBULATORY_CARE_PROVIDER_SITE_OTHER): Payer: PRIVATE HEALTH INSURANCE | Admitting: Gastroenterology

## 2021-06-18 DIAGNOSIS — M810 Age-related osteoporosis without current pathological fracture: Secondary | ICD-10-CM | POA: Diagnosis not present

## 2021-06-18 DIAGNOSIS — I2510 Atherosclerotic heart disease of native coronary artery without angina pectoris: Secondary | ICD-10-CM | POA: Diagnosis not present

## 2021-06-18 DIAGNOSIS — M6281 Muscle weakness (generalized): Secondary | ICD-10-CM | POA: Diagnosis not present

## 2021-07-19 DIAGNOSIS — M810 Age-related osteoporosis without current pathological fracture: Secondary | ICD-10-CM | POA: Diagnosis not present

## 2021-07-19 DIAGNOSIS — I2510 Atherosclerotic heart disease of native coronary artery without angina pectoris: Secondary | ICD-10-CM | POA: Diagnosis not present

## 2021-07-19 DIAGNOSIS — M6281 Muscle weakness (generalized): Secondary | ICD-10-CM | POA: Diagnosis not present

## 2021-08-16 DIAGNOSIS — N390 Urinary tract infection, site not specified: Secondary | ICD-10-CM | POA: Diagnosis not present

## 2021-08-18 DIAGNOSIS — I2510 Atherosclerotic heart disease of native coronary artery without angina pectoris: Secondary | ICD-10-CM | POA: Diagnosis not present

## 2021-08-18 DIAGNOSIS — M6281 Muscle weakness (generalized): Secondary | ICD-10-CM | POA: Diagnosis not present

## 2021-08-18 DIAGNOSIS — M810 Age-related osteoporosis without current pathological fracture: Secondary | ICD-10-CM | POA: Diagnosis not present

## 2021-09-13 ENCOUNTER — Emergency Department (HOSPITAL_COMMUNITY)
Admission: EM | Admit: 2021-09-13 | Discharge: 2021-09-13 | Disposition: A | Discharge status: Skilled Nursing Facility | Payer: Medicare Other | Attending: Emergency Medicine | Admitting: Emergency Medicine

## 2021-09-13 ENCOUNTER — Emergency Department (HOSPITAL_COMMUNITY): Payer: Medicare Other

## 2021-09-13 ENCOUNTER — Encounter (HOSPITAL_COMMUNITY): Payer: Self-pay

## 2021-09-13 DIAGNOSIS — Z20822 Contact with and (suspected) exposure to covid-19: Secondary | ICD-10-CM | POA: Insufficient documentation

## 2021-09-13 DIAGNOSIS — R4182 Altered mental status, unspecified: Secondary | ICD-10-CM | POA: Diagnosis not present

## 2021-09-13 DIAGNOSIS — Z743 Need for continuous supervision: Secondary | ICD-10-CM | POA: Diagnosis not present

## 2021-09-13 DIAGNOSIS — R404 Transient alteration of awareness: Secondary | ICD-10-CM | POA: Diagnosis not present

## 2021-09-13 DIAGNOSIS — I10 Essential (primary) hypertension: Secondary | ICD-10-CM | POA: Diagnosis not present

## 2021-09-13 LAB — CBC WITH DIFFERENTIAL/PLATELET
Abs Immature Granulocytes: 0.01 10*3/uL (ref 0.00–0.07)
Basophils Absolute: 0 10*3/uL (ref 0.0–0.1)
Basophils Relative: 1 %
Eosinophils Absolute: 0 10*3/uL (ref 0.0–0.5)
Eosinophils Relative: 1 %
HCT: 36.3 % (ref 36.0–46.0)
Hemoglobin: 10.6 g/dL — ABNORMAL LOW (ref 12.0–15.0)
Immature Granulocytes: 0 %
Lymphocytes Relative: 53 %
Lymphs Abs: 2.3 10*3/uL (ref 0.7–4.0)
MCH: 22.3 pg — ABNORMAL LOW (ref 26.0–34.0)
MCHC: 29.2 g/dL — ABNORMAL LOW (ref 30.0–36.0)
MCV: 76.3 fL — ABNORMAL LOW (ref 80.0–100.0)
Monocytes Absolute: 0.6 10*3/uL (ref 0.1–1.0)
Monocytes Relative: 14 %
Neutro Abs: 1.4 10*3/uL — ABNORMAL LOW (ref 1.7–7.7)
Neutrophils Relative %: 31 %
Platelets: 151 10*3/uL (ref 150–400)
RBC: 4.76 MIL/uL (ref 3.87–5.11)
RDW: 19.2 % — ABNORMAL HIGH (ref 11.5–15.5)
WBC: 4.4 10*3/uL (ref 4.0–10.5)
nRBC: 0 % (ref 0.0–0.2)

## 2021-09-13 LAB — COMPREHENSIVE METABOLIC PANEL
ALT: 13 U/L (ref 0–44)
AST: 25 U/L (ref 15–41)
Albumin: 3.8 g/dL (ref 3.5–5.0)
Alkaline Phosphatase: 83 U/L (ref 38–126)
Anion gap: 7 (ref 5–15)
BUN: 27 mg/dL — ABNORMAL HIGH (ref 8–23)
CO2: 24 mmol/L (ref 22–32)
Calcium: 9.4 mg/dL (ref 8.9–10.3)
Chloride: 111 mmol/L (ref 98–111)
Creatinine, Ser: 1.02 mg/dL — ABNORMAL HIGH (ref 0.44–1.00)
GFR, Estimated: 52 mL/min — ABNORMAL LOW (ref >60)
Glucose, Bld: 96 mg/dL (ref 70–99)
Potassium: 4 mmol/L (ref 3.5–5.1)
Sodium: 142 mmol/L (ref 135–145)
Total Bilirubin: 0.2 mg/dL — ABNORMAL LOW (ref 0.3–1.2)
Total Protein: 7.2 g/dL (ref 6.5–8.1)

## 2021-09-13 LAB — URINALYSIS, ROUTINE W REFLEX MICROSCOPIC
Bilirubin Urine: NEGATIVE
Glucose, UA: NEGATIVE mg/dL
Hgb urine dipstick: NEGATIVE
Ketones, ur: NEGATIVE mg/dL
Leukocytes,Ua: NEGATIVE
Nitrite: NEGATIVE
Protein, ur: NEGATIVE mg/dL
Specific Gravity, Urine: 1.016 (ref 1.005–1.030)
pH: 6 (ref 5.0–8.0)

## 2021-09-13 LAB — SARS CORONAVIRUS 2 BY RT PCR: SARS Coronavirus 2 by RT PCR: NEGATIVE

## 2021-09-13 MED ORDER — HALOPERIDOL LACTATE 5 MG/ML IJ SOLN
5.0000 mg | Freq: Once | INTRAMUSCULAR | Status: AC
  Administered 2021-09-13: 5 mg via INTRAVENOUS
  Filled 2021-09-13: qty 1

## 2021-09-13 NOTE — ED Provider Notes (Signed)
88 yo female presenting with AMS, combatative from nursing home Given haldol on arrival here  Pending CTH and UA  Clinical Course as of 09/13/21 1350  Thu Sep 13, 2021  1146 Patient reassessed and seems much calmer more cooperative at this time.  Her work-up does not show any clear cause for her behavioral change, although CT of her brain does show some encephalomalacia changes, which are chronic, and could also be compounded by her dementia causing behavioral disturbance. [MT]    Clinical Course User Index [MT] Terald Sleeper, MD    Patient was not present at the bedside her daughter, who confirms the patient appears to be at baseline.  I explained this is likely waxing and waning dementia and possible encephalomalacia, although she appears oriented and is back to baseline at this point.  I do not see any acute medical cause that is treatable or reversible or warrant hospitalization.  The patient was able to drink juice easily, was ambulating in the ED, can be discharged back to her facility.   Terald Sleeper, MD 09/13/21 1350

## 2021-09-13 NOTE — ED Triage Notes (Signed)
Pt comes from [place], per facility pt woke up today combative with AMS, pt hitting and kicking EMS and staff.

## 2021-09-13 NOTE — ED Provider Notes (Signed)
[facility] EMERGENCY DEPARTMENT Provider Note   CSN: [id number] Arrival date & time: 09/13/21  0932     History  Chief Complaint  Patient presents with   Altered Mental Status    Melissa Bentley is a 88 y.o. female.  Brought to the emergency department from nursing facility.  Patient who is normally confused but pleasant has been aggressive and combative.       Home Medications Prior to Admission medications   Medication Sig Start Date End Date Taking? Authorizing Provider  acetaminophen (TYLENOL) 500 MG tablet Take 500 mg by mouth every 4 (four) hours as needed for mild pain, fever or headache.    [provider]  alum & mag hydroxide-simeth (MAALOX/MYLANTA) 200-200-20 MG/5ML suspension Take 30 mLs by mouth every 6 (six) hours as needed for indigestion or heartburn.    [provider]  Amino Acids-Protein Hydrolys (FEEDING SUPPLEMENT, PRO-STAT SUGAR FREE 64,) LIQD Take 30 mLs by mouth 2 (two) times daily.    [provider]  [place] [facility]) OINT Apply 1 application topically as directed. Apply to coccyx, sacrum and bilateral buttocks every shift.    [provider]  clonazePAM (KLONOPIN) 0.5 MG tablet Take 0.5 tablets (0.25 mg total) by mouth every 12 (twelve) hours as needed for anxiety. 05/14/21   Vassie Loll, MD  DULoxetine (CYMBALTA) 30 MG capsule Take 30 mg by mouth daily. 02/13/21   [provider]  feeding supplement (ENSURE ENLIVE / ENSURE PLUS) LIQD Take 237 mLs by mouth 2 (two) times daily between meals. 05/14/21   Vassie Loll, MD  folic acid (FOLVITE) 1 MG tablet Take 1 tablet (1 mg total) by mouth daily. 03/06/21   Almon Hercules, MD  guaiFENesin (ROBITUSSIN) 100 MG/5ML liquid Take 5 mLs by mouth every 6 (six) hours as needed for cough or to loosen phlegm.    [provider]  isosorbide mononitrate (ISMO) 10 MG tablet Take 0.5 tablets (5 mg total) by mouth 3 (three) times daily before meals.  03/05/21   Almon Hercules, MD  levothyroxine (SYNTHROID) 50 MCG tablet Take 25 mcg by mouth daily. 07/01/18   [provider]  metoprolol tartrate (LOPRESSOR) 25 MG tablet Take 0.5 tablets (12.5 mg total) by mouth 2 (two) times daily. 03/05/21   Almon Hercules, MD  midodrine (PROAMATINE) 2.5 MG tablet Take 1 tablet (2.5 mg total) by mouth 3 (three) times daily with meals. 03/05/21   Almon Hercules, MD  mirtazapine (REMERON) 7.5 MG tablet Take 1 tablet (7.5 mg total) by mouth at bedtime. 05/14/21   Vassie Loll, MD  ondansetron (ZOFRAN-ODT) 8 MG disintegrating tablet Take 0.5 tablets (4 mg total) by mouth every 8 (eight) hours as needed for nausea or vomiting. 05/14/21   Vassie Loll, MD  pantoprazole (PROTONIX) 40 MG tablet Take 1 tablet (40 mg total) by mouth 2 (two) times daily. 05/14/21   Vassie Loll, MD      Allergies    Patient has no known allergies.    Review of Systems   Review of Systems  Physical Exam Updated Vital Signs BP (!) 214/94   Pulse 81   Temp (!) 97.3 F (36.3 C) (Axillary)   Resp 19   SpO2 99%  Physical Exam Vitals and nursing note reviewed.  Constitutional:      General: She is not in acute distress.    Appearance: She is well-developed.  HENT:     Head: Normocephalic and atraumatic.  Mouth/Throat:     Mouth: Mucous membranes are moist.  Eyes:     General: Vision grossly intact. Gaze aligned appropriately.     Extraocular Movements: Extraocular movements intact.     Conjunctiva/sclera: Conjunctivae normal.  Cardiovascular:     Rate and Rhythm: Normal rate and regular rhythm.     Pulses: Normal pulses.     Heart sounds: Normal heart sounds, S1 normal and S2 normal. No murmur heard.    No friction rub. No gallop.  Pulmonary:     Effort: Pulmonary effort is normal. No respiratory distress.     Breath sounds: Normal breath sounds.  Abdominal:     General: Bowel sounds are normal.     Palpations: Abdomen is soft.     Tenderness: There is no  abdominal tenderness. There is no guarding or rebound.     Hernia: No hernia is present.  Musculoskeletal:        General: No swelling.     Cervical back: Full passive range of motion without pain, normal range of motion and neck supple. No spinous process tenderness or muscular tenderness. Normal range of motion.     Right lower leg: No edema.     Left lower leg: No edema.  Skin:    General: Skin is warm and dry.     Capillary Refill: Capillary refill takes less than 2 seconds.     Findings: No ecchymosis, erythema, rash or wound.  Neurological:     General: No focal deficit present.     Mental Status: She is alert. She is confused.     Cranial Nerves: Cranial nerves 2-12 are intact.     Sensory: Sensation is intact.     Motor: Motor function is intact.     Coordination: Coordination is intact.  Psychiatric:        Speech: Speech normal.        Behavior: Behavior is agitated and aggressive.     ED Results / Procedures / Treatments   Labs (all labs ordered are listed, but only abnormal results are displayed) Labs Reviewed  CBC WITH DIFFERENTIAL/PLATELET - Abnormal; Notable for the following components:      Result Value   Hemoglobin 10.6 (*)    MCV 76.3 (*)    MCH 22.3 (*)    MCHC 29.2 (*)    RDW 19.2 (*)    Neutro Abs 1.4 (*)    All other components within normal limits  COMPREHENSIVE METABOLIC PANEL - Abnormal; Notable for the following components:   BUN 27 (*)    Creatinine, Ser 1.02 (*)    Total Bilirubin 0.2 (*)    GFR, Estimated 52 (*)    All other components within normal limits  SARS CORONAVIRUS 2 BY RT PCR  URINALYSIS, ROUTINE W REFLEX MICROSCOPIC    EKG EKG Interpretation  Date/Time:  Thursday September 13 2021 06:18:44 EDT Ventricular Rate:  73 PR Interval:  181 QRS Duration: 81 QT Interval:  516 QTC Calculation: 569 R Axis:   102 Text Interpretation: Sinus rhythm Right axis deviation Nonspecific repol abnormality, diffuse leads Prolonged QT interval  Confirmed by Gilda Crease [phone]) on 09/13/2021 7:39:56 AM  Radiology CT HEAD WO CONTRAST ( )  Result Date: 09/13/2021 CLINICAL DATA:  Mental status changes.  05/10/2021 EXAM: CT HEAD WITHOUT CONTRAST TECHNIQUE: Contiguous axial images were obtained from the base of the skull through the vertex without intravenous contrast. RADIATION DOSE REDUCTION: This exam was performed according to the departmental dose-optimization program  which includes automated exposure control, adjustment of the mA and/or kV according to patient size and/or use of iterative reconstruction technique. COMPARISON:  None Available. FINDINGS: Brain: There is no evidence for acute hemorrhage, hydrocephalus, mass lesion, or abnormal extra-axial fluid collection. No definite CT evidence for acute infarction. Diffuse loss of parenchymal volume is consistent with atrophy. Patchy low attenuation in the deep hemispheric and periventricular white matter is nonspecific, but likely reflects chronic microvascular ischemic demyelination. Stable right temporoparietal encephalomalacia. Vascular: No hyperdense vessel or unexpected calcification. Skull: No evidence for fracture. No worrisome lytic or sclerotic lesion. Right knee item E defect again noted. Sinuses/Orbits: The visualized paranasal sinuses and mastoid air cells are clear. Visualized portions of the globes and intraorbital fat are unremarkable. Other: None. IMPRESSION: 1. No acute intracranial abnormality. 2. Stable right temporoparietal encephalomalacia. 3. Atrophy with chronic small vessel ischemic disease. Electronically Signed   By: Misty Stanley M.D.   On: 09/13/2021 07:47    Procedures Procedures    Medications Ordered in ED Medications  haloperidol lactate (HALDOL) injection 5 mg (5 mg Intravenous Given 09/13/21 [id number])    ED Course/ Medical Decision Making/ A&P                           Medical Decision Making Amount and/or Complexity of Data Reviewed Labs:  ordered. Radiology: ordered.  Risk Prescription drug management.   88 year old female presents to the emergency department for altered mental status.  Patient extremely aggressive at arrival, angry, yelling and punching at staff.  Placed in restraints and then given IV Haldol to facilitate work-up.  CT head without acute abnormality.  CBC, chemistry normal.  Awaiting urinalysis to further evaluate for etiology of confusion.  Signed out to oncoming ER physician to follow-up on results.        Final Clinical Impression(s) / ED Diagnoses Final diagnoses:  Altered mental status, unspecified altered mental status type    Rx / DC Orders ED Discharge Orders     None         Chandi Nicklin, Gwenyth Allegra, MD 09/13/21 [phone]

## 2021-09-13 NOTE — Discharge Instructions (Signed)
Melissa Bentley's workup in the ER was reassuring.  We did not see signs of infection, urine infection, anemia, dehydration, or stroke on her brain scan.  She may be experiencing some mood swings or changes due to her dementia and her prior brain injuries.  Typically patients like these have mood swings and confusion overnight in the early morning, or they may experience a "sundowning effect".  Try to keep her on a regular sleep pattern is much as possible.  She was back to normal in the afternoon in the ER.

## 2021-09-13 NOTE — ED Notes (Signed)
Spoke with provider, taking patient out of restraints at this time.  Patient has been cooperative and family is at bedside.

## 2021-09-18 DIAGNOSIS — I2510 Atherosclerotic heart disease of native coronary artery without angina pectoris: Secondary | ICD-10-CM | POA: Diagnosis not present

## 2021-09-18 DIAGNOSIS — M6281 Muscle weakness (generalized): Secondary | ICD-10-CM | POA: Diagnosis not present

## 2021-09-18 DIAGNOSIS — M810 Age-related osteoporosis without current pathological fracture: Secondary | ICD-10-CM | POA: Diagnosis not present

## 2021-09-27 DIAGNOSIS — H40033 Anatomical narrow angle, bilateral: Secondary | ICD-10-CM | POA: Diagnosis not present

## 2021-09-27 DIAGNOSIS — H2513 Age-related nuclear cataract, bilateral: Secondary | ICD-10-CM | POA: Diagnosis not present

## 2021-10-19 DIAGNOSIS — M6281 Muscle weakness (generalized): Secondary | ICD-10-CM | POA: Diagnosis not present

## 2021-10-19 DIAGNOSIS — I2510 Atherosclerotic heart disease of native coronary artery without angina pectoris: Secondary | ICD-10-CM | POA: Diagnosis not present

## 2021-10-19 DIAGNOSIS — M810 Age-related osteoporosis without current pathological fracture: Secondary | ICD-10-CM | POA: Diagnosis not present

## 2021-11-18 DIAGNOSIS — M810 Age-related osteoporosis without current pathological fracture: Secondary | ICD-10-CM | POA: Diagnosis not present

## 2021-11-18 DIAGNOSIS — I2510 Atherosclerotic heart disease of native coronary artery without angina pectoris: Secondary | ICD-10-CM | POA: Diagnosis not present

## 2021-11-18 DIAGNOSIS — M6281 Muscle weakness (generalized): Secondary | ICD-10-CM | POA: Diagnosis not present

## 2021-12-04 DIAGNOSIS — F0393 Unspecified dementia, unspecified severity, with mood disturbance: Secondary | ICD-10-CM | POA: Diagnosis not present

## 2021-12-04 DIAGNOSIS — E039 Hypothyroidism, unspecified: Secondary | ICD-10-CM | POA: Diagnosis not present

## 2021-12-04 DIAGNOSIS — K219 Gastro-esophageal reflux disease without esophagitis: Secondary | ICD-10-CM | POA: Diagnosis not present

## 2021-12-04 DIAGNOSIS — E440 Moderate protein-calorie malnutrition: Secondary | ICD-10-CM | POA: Diagnosis not present

## 2021-12-04 DIAGNOSIS — K224 Dyskinesia of esophagus: Secondary | ICD-10-CM | POA: Diagnosis not present

## 2021-12-04 DIAGNOSIS — N390 Urinary tract infection, site not specified: Secondary | ICD-10-CM | POA: Diagnosis not present

## 2021-12-04 DIAGNOSIS — D500 Iron deficiency anemia secondary to blood loss (chronic): Secondary | ICD-10-CM | POA: Diagnosis not present

## 2021-12-04 DIAGNOSIS — I959 Hypotension, unspecified: Secondary | ICD-10-CM | POA: Diagnosis not present

## 2021-12-04 DIAGNOSIS — R062 Wheezing: Secondary | ICD-10-CM | POA: Diagnosis not present

## 2021-12-04 DIAGNOSIS — M5459 Other low back pain: Secondary | ICD-10-CM | POA: Diagnosis not present

## 2021-12-19 DIAGNOSIS — M810 Age-related osteoporosis without current pathological fracture: Secondary | ICD-10-CM | POA: Diagnosis not present

## 2021-12-19 DIAGNOSIS — I2510 Atherosclerotic heart disease of native coronary artery without angina pectoris: Secondary | ICD-10-CM | POA: Diagnosis not present

## 2021-12-19 DIAGNOSIS — M6281 Muscle weakness (generalized): Secondary | ICD-10-CM | POA: Diagnosis not present

## 2022-01-10 ENCOUNTER — Emergency Department (HOSPITAL_COMMUNITY)

## 2022-01-10 ENCOUNTER — Other Ambulatory Visit: Payer: Self-pay

## 2022-01-10 ENCOUNTER — Emergency Department (HOSPITAL_COMMUNITY)
Admission: EM | Admit: 2022-01-10 | Discharge: 2022-01-10 | Disposition: A | Discharge status: Skilled Nursing Facility | Attending: Emergency Medicine | Admitting: Emergency Medicine

## 2022-01-10 DIAGNOSIS — R6889 Other general symptoms and signs: Secondary | ICD-10-CM | POA: Diagnosis not present

## 2022-01-10 DIAGNOSIS — S022XXA Fracture of nasal bones, initial encounter for closed fracture: Secondary | ICD-10-CM | POA: Insufficient documentation

## 2022-01-10 DIAGNOSIS — S0083XA Contusion of other part of head, initial encounter: Secondary | ICD-10-CM | POA: Diagnosis not present

## 2022-01-10 DIAGNOSIS — S199XXA Unspecified injury of neck, initial encounter: Secondary | ICD-10-CM | POA: Diagnosis not present

## 2022-01-10 DIAGNOSIS — J439 Emphysema, unspecified: Secondary | ICD-10-CM | POA: Insufficient documentation

## 2022-01-10 DIAGNOSIS — W19XXXA Unspecified fall, initial encounter: Secondary | ICD-10-CM | POA: Insufficient documentation

## 2022-01-10 DIAGNOSIS — S0121XA Laceration without foreign body of nose, initial encounter: Secondary | ICD-10-CM | POA: Insufficient documentation

## 2022-01-10 DIAGNOSIS — I6381 Other cerebral infarction due to occlusion or stenosis of small artery: Secondary | ICD-10-CM | POA: Diagnosis not present

## 2022-01-10 DIAGNOSIS — R52 Pain, unspecified: Secondary | ICD-10-CM | POA: Diagnosis not present

## 2022-01-10 DIAGNOSIS — Z743 Need for continuous supervision: Secondary | ICD-10-CM | POA: Diagnosis not present

## 2022-01-10 DIAGNOSIS — S0993XA Unspecified injury of face, initial encounter: Secondary | ICD-10-CM | POA: Diagnosis present

## 2022-01-10 DIAGNOSIS — I10 Essential (primary) hypertension: Secondary | ICD-10-CM | POA: Diagnosis not present

## 2022-01-10 DIAGNOSIS — I700 Atherosclerosis of aorta: Secondary | ICD-10-CM | POA: Diagnosis not present

## 2022-01-10 DIAGNOSIS — R58 Hemorrhage, not elsewhere classified: Secondary | ICD-10-CM | POA: Diagnosis not present

## 2022-01-10 LAB — BASIC METABOLIC PANEL
Anion gap: 7 (ref 5–15)
BUN: 18 mg/dL (ref 8–23)
CO2: 24 mmol/L (ref 22–32)
Calcium: 9.2 mg/dL (ref 8.9–10.3)
Chloride: 107 mmol/L (ref 98–111)
Creatinine, Ser: 1.03 mg/dL — ABNORMAL HIGH (ref 0.44–1.00)
GFR, Estimated: 51 mL/min — ABNORMAL LOW (ref >60)
Glucose, Bld: 108 mg/dL — ABNORMAL HIGH (ref 70–99)
Potassium: 3.2 mmol/L — ABNORMAL LOW (ref 3.5–5.1)
Sodium: 138 mmol/L (ref 135–145)

## 2022-01-10 LAB — CBC WITH DIFFERENTIAL/PLATELET
Abs Immature Granulocytes: 0.03 10*3/uL (ref 0.00–0.07)
Basophils Absolute: 0 10*3/uL (ref 0.0–0.1)
Basophils Relative: 0 %
Eosinophils Absolute: 0 10*3/uL (ref 0.0–0.5)
Eosinophils Relative: 0 %
HCT: 36.1 % (ref 36.0–46.0)
Hemoglobin: 10.5 g/dL — ABNORMAL LOW (ref 12.0–15.0)
Immature Granulocytes: 1 %
Lymphocytes Relative: 22 %
Lymphs Abs: 1.3 10*3/uL (ref 0.7–4.0)
MCH: 23.5 pg — ABNORMAL LOW (ref 26.0–34.0)
MCHC: 29.1 g/dL — ABNORMAL LOW (ref 30.0–36.0)
MCV: 80.9 fL (ref 80.0–100.0)
Monocytes Absolute: 0.5 10*3/uL (ref 0.1–1.0)
Monocytes Relative: 8 %
Neutro Abs: 4 10*3/uL (ref 1.7–7.7)
Neutrophils Relative %: 69 %
Platelets: 177 10*3/uL (ref 150–400)
RBC: 4.46 MIL/uL (ref 3.87–5.11)
RDW: 18.3 % — ABNORMAL HIGH (ref 11.5–15.5)
WBC: 5.8 10*3/uL (ref 4.0–10.5)
nRBC: 0 % (ref 0.0–0.2)

## 2022-01-10 MED ORDER — POTASSIUM CHLORIDE CRYS ER 20 MEQ PO TBCR
40.0000 meq | EXTENDED_RELEASE_TABLET | Freq: Once | ORAL | Status: AC
  Administered 2022-01-10: 40 meq via ORAL
  Filled 2022-01-10: qty 2

## 2022-01-10 MED ORDER — MAGNESIUM OXIDE -MG SUPPLEMENT 400 (240 MG) MG PO TABS
800.0000 mg | ORAL_TABLET | Freq: Once | ORAL | Status: AC
  Administered 2022-01-10: 800 mg via ORAL
  Filled 2022-01-10: qty 2

## 2022-01-10 NOTE — ED Notes (Signed)
Patient transported to CT 

## 2022-01-10 NOTE — ED Triage Notes (Signed)
Pt presents with fall this morning while getting out of bed. Pt hit nose on dresser. Swelling and discoloration noted to nose. Small skin tears to right arm and hand. Pt with history of dementia. Pt is not on blood thinners.

## 2022-01-10 NOTE — ED Notes (Addendum)
Pt is a hospice pt and hospice nurse called this RN to see if there were any questions that needed to be answered. If there are any question going further then their contact information is [phone]. They will be in touch for follow up.

## 2022-01-10 NOTE — ED Provider Notes (Signed)
  Physical Exam  BP (!) 157/57   Pulse 62   Resp 18   Wt 49.9 kg   SpO2 97%   BMI 21.48 kg/m   Physical Exam Vitals and nursing note reviewed.  Constitutional:      General: She is not in acute distress.    Appearance: She is well-developed.  HENT:     Head: Normocephalic and atraumatic.  Eyes:     Conjunctiva/sclera: Conjunctivae normal.  Cardiovascular:     Rate and Rhythm: Normal rate and regular rhythm.     Heart sounds: No murmur heard. Pulmonary:     Effort: Pulmonary effort is normal. No respiratory distress.  Musculoskeletal:        General: No swelling.     Cervical back: Neck supple.  Skin:    General: Skin is warm and dry.     Capillary Refill: Capillary refill takes less than 2 seconds.  Neurological:     Mental Status: She is alert.  Psychiatric:        Mood and Affect: Mood normal.     Procedures  Procedures  ED Course / MDM    Medical Decision Making Amount and/or Complexity of Data Reviewed Labs: ordered. Radiology: ordered. ECG/medicine tests: ordered.  Risk OTC drugs. Prescription drug management.   Patient received an handoff.  Fall with negative trauma imaging.  Pending completion of laboratory evaluation.  Laboratory evaluation largely unremarkable outside of some mild hypokalemia which we repleted in the emergency department.  Patient then discharged with outpatient follow-up.      Glendora Score, MD 01/10/22 [phone]

## 2022-01-10 NOTE — ED Provider Notes (Signed)
[facility] EMERGENCY DEPARTMENT Provider Note   CSN: [id number] Arrival date & time: 01/10/22  0515     History  Chief Complaint  Patient presents with   Melissa Bentley is a 88 y.o. female.  88 year old female presents ER today after a fall.  Patient states that she was sleeping and she woke up on the floor.  He is unsure how she fell.  She lives at a facility called [facility] and here for further evaluation.  Not on any blood thinners.  She has a skin tear to the nose but no other pain.  Is not sure if she syncopized or not.   Fall       Home Medications Prior to Admission medications   Medication Sig Start Date End Date Taking? Authorizing Provider  acetaminophen (TYLENOL) 500 MG tablet Take 500 mg by mouth every 4 (four) hours as needed for mild pain, fever or headache.    [provider]  alum & mag hydroxide-simeth (MAALOX/MYLANTA) 200-200-20 MG/5ML suspension Take 30 mLs by mouth every 6 (six) hours as needed for indigestion or heartburn.    [provider]  Desp[employer] [facility]) OINT Apply 1 application topically as directed. Apply to coccyx, sacrum and bilateral buttocks every shift.    [provider]  clonazePAM (KLONOPIN) 0.5 MG tablet Take 0.5 tablets (0.25 mg total) by mouth every 12 (twelve) hours as needed for anxiety. 05/14/21   Vassie Loll, MD  DULoxetine (CYMBALTA) 30 MG capsule Take 30 mg by mouth daily. 02/13/21   [provider]  feeding supplement (ENSURE ENLIVE / ENSURE PLUS) LIQD Take 237 mLs by mouth 2 (two) times daily between meals. 05/14/21   Vassie Loll, MD  fluticasone (FLONASE) 50 MCG/ACT nasal spray Place 2 sprays into both nostrils daily. 08/17/21   [provider]  folic acid (FOLVITE) 1 MG tablet Take 1 tablet (1 mg total) by mouth daily. 03/06/21   Almon Hercules, MD  guaiFENesin (ROBITUSSIN) 100 MG/5ML liquid Take 5 mLs by mouth every 6 (six) hours as needed for cough or to loosen  phlegm.    [provider]  isosorbide mononitrate (ISMO) 10 MG tablet Take 0.5 tablets (5 mg total) by mouth 3 (three) times daily before meals. 03/05/21   Almon Hercules, MD  levothyroxine (SYNTHROID) 50 MCG tablet Take 25 mcg by mouth daily. 07/01/18   [provider]  metoprolol tartrate (LOPRESSOR) 25 MG tablet Take 0.5 tablets (12.5 mg total) by mouth 2 (two) times daily. 03/05/21   Almon Hercules, MD  midodrine (PROAMATINE) 2.5 MG tablet Take 1 tablet (2.5 mg total) by mouth 3 (three) times daily with meals. 03/05/21   Almon Hercules, MD  mirtazapine (REMERON) 7.5 MG tablet Take 1 tablet (7.5 mg total) by mouth at bedtime. 05/14/21   Vassie Loll, MD  mupirocin ointment (BACTROBAN) 2 % Apply 1 Application topically daily. 07/17/21   [provider]  ondansetron (ZOFRAN-ODT) 8 MG disintegrating tablet Take 0.5 tablets (4 mg total) by mouth every 8 (eight) hours as needed for nausea or vomiting. Patient not taking: Reported on 09/13/2021 05/14/21   Vassie Loll, MD  pantoprazole (PROTONIX) 40 MG tablet Take 1 tablet (40 mg total) by mouth 2 (two) times daily. 05/14/21   Vassie Loll, MD      Allergies    Patient has no known allergies.    Review of Systems   Review of Systems  Physical Exam Updated Vital  Signs BP (!) 197/78   Pulse (!) 57   Resp 18   Wt 49.9 kg   SpO2 99%   BMI 21.48 kg/m  Physical Exam Vitals and nursing note reviewed.  Constitutional:      Appearance: She is well-developed.  HENT:     Head: Normocephalic.     Comments: Skin tear over bridge of nose that is also mildly swollen but appear straight.  Developing ecchymosis around her eyes and above her nose.     Nose:     Comments: Nares open bilaterally with dried blood but no active bleeding Cardiovascular:     Rate and Rhythm: Normal rate and regular rhythm.  Pulmonary:     Effort: No respiratory distress.     Breath sounds: No stridor.  Abdominal:     General: There is no distension.   Musculoskeletal:     Cervical back: Normal range of motion.     Comments: No cervical spine tenderness, thoracic spine tenderness or Lumbar spine tenderness.  No tenderness or pain with palpation and full ROM of all joints in upper and lower extremities.  No ecchymosis or other signs of trauma on back or extremities.  No Pain with AP or lateral compression of ribs.  No Paracervical ttp, paraspinal ttp   Neurological:     Mental Status: She is alert.     ED Results / Procedures / Treatments   Labs (all labs ordered are listed, but only abnormal results are displayed) Labs Reviewed - No data to display  EKG None  Radiology No results found.  Procedures Procedures    Medications Ordered in ED Medications - No data to display  ED Course/ Medical Decision Making/ A&P                           Medical Decision Making Amount and/or Complexity of Data Reviewed Labs: ordered. Radiology: ordered. ECG/medicine tests: ordered.  Risk OTC drugs. Prescription drug management.   Will eval for traumatic injury.   Will check cbc/bmp/ecg to ensure no cause for possible syncope. Suspect she will be ok to go home.   Care trnasferred pending bmp and ultimate disposition.    Final Clinical Impression(s) / ED Diagnoses Final diagnoses:  None    Rx / DC Orders ED Discharge Orders     None         Leelah Hanna, Barbara Cower, MD 01/11/22 [phone]

## 2022-01-18 DIAGNOSIS — I2510 Atherosclerotic heart disease of native coronary artery without angina pectoris: Secondary | ICD-10-CM | POA: Diagnosis not present

## 2022-01-18 DIAGNOSIS — M6281 Muscle weakness (generalized): Secondary | ICD-10-CM | POA: Diagnosis not present

## 2022-01-18 DIAGNOSIS — M810 Age-related osteoporosis without current pathological fracture: Secondary | ICD-10-CM | POA: Diagnosis not present

## 2022-02-18 DIAGNOSIS — M6281 Muscle weakness (generalized): Secondary | ICD-10-CM | POA: Diagnosis not present

## 2022-02-18 DIAGNOSIS — I2510 Atherosclerotic heart disease of native coronary artery without angina pectoris: Secondary | ICD-10-CM | POA: Diagnosis not present

## 2022-02-18 DIAGNOSIS — M810 Age-related osteoporosis without current pathological fracture: Secondary | ICD-10-CM | POA: Diagnosis not present

## 2022-02-26 ENCOUNTER — Other Ambulatory Visit: Payer: Self-pay

## 2022-02-26 ENCOUNTER — Emergency Department (HOSPITAL_COMMUNITY)
Admission: EM | Admit: 2022-02-26 | Discharge: 2022-02-26 | Disposition: A | Discharge status: Home / Self Care | Attending: Emergency Medicine | Admitting: Emergency Medicine

## 2022-02-26 ENCOUNTER — Emergency Department (HOSPITAL_COMMUNITY)

## 2022-02-26 ENCOUNTER — Encounter (HOSPITAL_COMMUNITY): Payer: Self-pay

## 2022-02-26 DIAGNOSIS — I10 Essential (primary) hypertension: Secondary | ICD-10-CM | POA: Diagnosis not present

## 2022-02-26 DIAGNOSIS — I2510 Atherosclerotic heart disease of native coronary artery without angina pectoris: Secondary | ICD-10-CM | POA: Diagnosis not present

## 2022-02-26 DIAGNOSIS — R519 Headache, unspecified: Secondary | ICD-10-CM | POA: Diagnosis present

## 2022-02-26 DIAGNOSIS — S51011A Laceration without foreign body of right elbow, initial encounter: Secondary | ICD-10-CM | POA: Insufficient documentation

## 2022-02-26 DIAGNOSIS — Z23 Encounter for immunization: Secondary | ICD-10-CM | POA: Insufficient documentation

## 2022-02-26 DIAGNOSIS — Y92129 Unspecified place in nursing home as the place of occurrence of the external cause: Secondary | ICD-10-CM | POA: Insufficient documentation

## 2022-02-26 DIAGNOSIS — R6889 Other general symptoms and signs: Secondary | ICD-10-CM | POA: Diagnosis not present

## 2022-02-26 DIAGNOSIS — W19XXXA Unspecified fall, initial encounter: Secondary | ICD-10-CM | POA: Diagnosis not present

## 2022-02-26 DIAGNOSIS — Z743 Need for continuous supervision: Secondary | ICD-10-CM | POA: Diagnosis not present

## 2022-02-26 DIAGNOSIS — S32512A Fracture of superior rim of left pubis, initial encounter for closed fracture: Secondary | ICD-10-CM | POA: Diagnosis not present

## 2022-02-26 DIAGNOSIS — S199XXA Unspecified injury of neck, initial encounter: Secondary | ICD-10-CM | POA: Diagnosis not present

## 2022-02-26 DIAGNOSIS — G9389 Other specified disorders of brain: Secondary | ICD-10-CM | POA: Diagnosis not present

## 2022-02-26 DIAGNOSIS — R58 Hemorrhage, not elsewhere classified: Secondary | ICD-10-CM | POA: Diagnosis not present

## 2022-02-26 DIAGNOSIS — W06XXXA Fall from bed, initial encounter: Secondary | ICD-10-CM | POA: Insufficient documentation

## 2022-02-26 DIAGNOSIS — J439 Emphysema, unspecified: Secondary | ICD-10-CM | POA: Diagnosis not present

## 2022-02-26 DIAGNOSIS — F0390 Unspecified dementia without behavioral disturbance: Secondary | ICD-10-CM | POA: Diagnosis not present

## 2022-02-26 DIAGNOSIS — S0990XA Unspecified injury of head, initial encounter: Secondary | ICD-10-CM | POA: Diagnosis not present

## 2022-02-26 DIAGNOSIS — S51811A Laceration without foreign body of right forearm, initial encounter: Secondary | ICD-10-CM | POA: Insufficient documentation

## 2022-02-26 DIAGNOSIS — I499 Cardiac arrhythmia, unspecified: Secondary | ICD-10-CM | POA: Diagnosis not present

## 2022-02-26 DIAGNOSIS — Z79899 Other long term (current) drug therapy: Secondary | ICD-10-CM | POA: Insufficient documentation

## 2022-02-26 DIAGNOSIS — R531 Weakness: Secondary | ICD-10-CM | POA: Diagnosis not present

## 2022-02-26 DIAGNOSIS — R079 Chest pain, unspecified: Secondary | ICD-10-CM | POA: Diagnosis not present

## 2022-02-26 MED ORDER — TETANUS-DIPHTH-ACELL PERTUSSIS 5-2.5-18.5 LF-MCG/0.5 IM SUSY
0.5000 mL | PREFILLED_SYRINGE | Freq: Once | INTRAMUSCULAR | Status: AC
  Administered 2022-02-26: 0.5 mL via INTRAMUSCULAR
  Filled 2022-02-26: qty 0.5

## 2022-02-26 MED ORDER — ACETAMINOPHEN 325 MG PO TABS
650.0000 mg | ORAL_TABLET | Freq: Once | ORAL | Status: AC
Start: 2022-02-26 — End: 2022-02-26
  Administered 2022-02-26: 650 mg via ORAL
  Filled 2022-02-26: qty 2

## 2022-02-26 NOTE — Discharge Instructions (Signed)
Your imaging studies in the emergency department did not show any new injuries.  Take Tylenol as needed for soreness.  While in the emergency department your heart rate was low.  Hold on your metoprolol and see if this improves.  Discuss this further with your primary care doctor.

## 2022-02-26 NOTE — ED Provider Notes (Signed)
[facility] Provider Note   CSN: [id number] Arrival date & time: 02/26/22  1154     History  Chief Complaint  Patient presents with   Adalberto Cole is a 89 y.o. female.   Fall  Patient presents after a fall.  Medical history includes dementia, CAD, GERD, HLD, HTN, osteoporosis, prior SDH.  She is not currently on any blood thinning medications.  She resides in memory unit of skilled nursing facility.  Earlier this morning, patient had fall out of bed.  Is unclear if this was witnessed, however, staff do feel that she did strike her head.  She sustained a skin tear to her right arm.  She arrives in the ED with son at bedside.  History is provided by son.  Son confirms that she is currently at her mental baseline.  Patient has been complaining of headache.  Son reports that she has had ongoing progression of generalized weakness.  Lately, she has been unable to walk short steps with assistance.  She is currently on hospice.     Home Medications Prior to Admission medications   Medication Sig Start Date End Date Taking? Authorizing Provider  acetaminophen (TYLENOL) 500 MG tablet Take 500 mg by mouth every 4 (four) hours as needed for mild pain, fever or headache.    [provider]  alum & mag hydroxide-simeth (MAALOX/MYLANTA) 200-200-20 MG/5ML suspension Take 30 mLs by mouth every 6 (six) hours as needed for indigestion or heartburn.    [provider]  [employer] [facility]) OINT Apply 1 application topically as directed. Apply to coccyx, sacrum and bilateral buttocks every shift.    [provider]  clonazePAM (KLONOPIN) 0.5 MG tablet Take 0.5 tablets (0.25 mg total) by mouth every 12 (twelve) hours as needed for anxiety. 05/14/21   Barton Dubois, MD  DULoxetine (CYMBALTA) 30 MG capsule Take 30 mg by mouth daily. 02/13/21   [provider]  feeding supplement (ENSURE ENLIVE / ENSURE  PLUS) LIQD Take 237 mLs by mouth 2 (two) times daily between meals. 05/14/21   Barton Dubois, MD  fluticasone (FLONASE) 50 MCG/ACT nasal spray Place 2 sprays into both nostrils daily. 08/17/21   [provider]  folic acid (FOLVITE) 1 MG tablet Take 1 tablet (1 mg total) by mouth daily. 03/06/21   Mercy Riding, MD  guaiFENesin (ROBITUSSIN) 100 MG/5ML liquid Take 5 mLs by mouth every 6 (six) hours as needed for cough or to loosen phlegm.    [provider]  isosorbide mononitrate (ISMO) 10 MG tablet Take 0.5 tablets (5 mg total) by mouth 3 (three) times daily before meals. 03/05/21   Mercy Riding, MD  levothyroxine (SYNTHROID) 50 MCG tablet Take 25 mcg by mouth daily. 07/01/18   [provider]  metoprolol tartrate (LOPRESSOR) 25 MG tablet Take 0.5 tablets (12.5 mg total) by mouth 2 (two) times daily. 03/05/21   Mercy Riding, MD  midodrine (PROAMATINE) 2.5 MG tablet Take 1 tablet (2.5 mg total) by mouth 3 (three) times daily with meals. 03/05/21   Mercy Riding, MD  mirtazapine (REMERON) 7.5 MG tablet Take 1 tablet (7.5 mg total) by mouth at bedtime. 05/14/21   Barton Dubois, MD  mupirocin ointment (BACTROBAN) 2 % Apply 1 Application topically daily. 07/17/21   [provider]  ondansetron (ZOFRAN-ODT) 8 MG disintegrating tablet Take 0.5 tablets (4 mg total) by mouth every 8 (eight) hours as needed  for nausea or vomiting. Patient not taking: Reported on 09/13/2021 05/14/21   Vassie Loll, MD  pantoprazole (PROTONIX) 40 MG tablet Take 1 tablet (40 mg total) by mouth 2 (two) times daily. 05/14/21   Vassie Loll, MD      Allergies    Patient has no known allergies.    Review of Systems   Review of Systems  Unable to perform ROS: Dementia    Physical Exam Updated Vital Signs BP (!) 176/70   Pulse (!) 44   Temp 97.6 F (36.4 C) (Axillary)   Resp 14   Ht 5' (1.524 m)   Wt 50 kg   SpO2 100%   BMI 21.53 kg/m  Physical Exam Vitals and nursing note reviewed.   Constitutional:      General: She is not in acute distress.    Appearance: Normal appearance. She is well-developed. She is ill-appearing (Chronically). She is not toxic-appearing or diaphoretic.  HENT:     Head: Normocephalic and atraumatic.     Right Ear: External ear normal.     Left Ear: External ear normal.     Nose: Nose normal.     Mouth/Throat:     Mouth: Mucous membranes are moist.  Eyes:     Extraocular Movements: Extraocular movements intact.     Conjunctiva/sclera: Conjunctivae normal.  Cardiovascular:     Rate and Rhythm: Normal rate and regular rhythm.     Heart sounds: No murmur heard. Pulmonary:     Effort: Pulmonary effort is normal. No respiratory distress.     Breath sounds: Normal breath sounds. No wheezing or rales.  Abdominal:     General: There is no distension.     Palpations: Abdomen is soft.     Tenderness: There is no abdominal tenderness.  Musculoskeletal:        General: No swelling or deformity.     Cervical back: Neck supple. No tenderness.     Right lower leg: No edema.     Left lower leg: No edema.  Skin:    General: Skin is warm and dry.     Capillary Refill: Capillary refill takes less than 2 seconds.     Coloration: Skin is not jaundiced or pale.     Comments: Skin tears to right elbow and forearm  Neurological:     General: No focal deficit present.     Mental Status: She is alert. Mental status is at baseline. She is disoriented.     Cranial Nerves: No cranial nerve deficit.     Sensory: No sensory deficit.     Motor: No weakness.     Coordination: Coordination normal.  Psychiatric:        Mood and Affect: Mood normal.        Behavior: Behavior normal.     ED Results / Procedures / Treatments   Labs (all labs ordered are listed, but only abnormal results are displayed) Labs Reviewed - No data to display  EKG None  Radiology CT HEAD WO CONTRAST  Result Date: 02/26/2022 CLINICAL DATA:  Head trauma, moderate-severe;  Polytrauma, blunt EXAM: CT HEAD WITHOUT CONTRAST CT CERVICAL SPINE WITHOUT CONTRAST TECHNIQUE: Multidetector CT imaging of the head and cervical spine was performed following the standard protocol without intravenous contrast. Multiplanar CT image reconstructions of the cervical spine were also generated. RADIATION DOSE REDUCTION: This exam was performed according to the departmental dose-optimization program which includes automated exposure control, adjustment of the mA and/or kV according to patient size  and/or use of iterative reconstruction technique. COMPARISON:  CT head and CT cervical spine 01/10/2022.  However FINDINGS: CT HEAD FINDINGS Brain: Similar right parietotemporal encephalomalacia with overlying craniotomy. No evidence of acute large vascular territory infarct, acute hemorrhage, mass lesion, midline shift or hydrocephalus. Vascular: Calcific atherosclerosis. Skull: No acute fracture.  Right-sided craniotomy. Sinuses/Orbits: Clear sinuses.  No acute orbital findings. Other: No mastoid effusions. CT CERVICAL SPINE FINDINGS Alignment: No substantial sagittal subluxation. Skull base and vertebrae: Vertebral body heights are maintained. No evidence of acute fracture. Osteopenia. Soft tissues and spinal canal: No prevertebral fluid or swelling. No visible canal hematoma. Disc levels: Severe multilevel degenerative change. This includes multilevel facet uncovertebral hypertrophy with varying degrees of neural foraminal stenosis. Also, posterior disc osteophyte complexes at multiple levels. Upper chest: Biapical pleuroparenchymal scarring.  Emphysema. IMPRESSION: No acute abnormality intracranially or in the cervical spine. Electronically Signed   By: Margaretha Sheffield M.D.   On: 02/26/2022 14:00   CT CERVICAL SPINE WO CONTRAST  Result Date: 02/26/2022 CLINICAL DATA:  Head trauma, moderate-severe; Polytrauma, blunt EXAM: CT HEAD WITHOUT CONTRAST CT CERVICAL SPINE WITHOUT CONTRAST TECHNIQUE:  Multidetector CT imaging of the head and cervical spine was performed following the standard protocol without intravenous contrast. Multiplanar CT image reconstructions of the cervical spine were also generated. RADIATION DOSE REDUCTION: This exam was performed according to the departmental dose-optimization program which includes automated exposure control, adjustment of the mA and/or kV according to patient size and/or use of iterative reconstruction technique. COMPARISON:  CT head and CT cervical spine 01/10/2022.  However FINDINGS: CT HEAD FINDINGS Brain: Similar right parietotemporal encephalomalacia with overlying craniotomy. No evidence of acute large vascular territory infarct, acute hemorrhage, mass lesion, midline shift or hydrocephalus. Vascular: Calcific atherosclerosis. Skull: No acute fracture.  Right-sided craniotomy. Sinuses/Orbits: Clear sinuses.  No acute orbital findings. Other: No mastoid effusions. CT CERVICAL SPINE FINDINGS Alignment: No substantial sagittal subluxation. Skull base and vertebrae: Vertebral body heights are maintained. No evidence of acute fracture. Osteopenia. Soft tissues and spinal canal: No prevertebral fluid or swelling. No visible canal hematoma. Disc levels: Severe multilevel degenerative change. This includes multilevel facet uncovertebral hypertrophy with varying degrees of neural foraminal stenosis. Also, posterior disc osteophyte complexes at multiple levels. Upper chest: Biapical pleuroparenchymal scarring.  Emphysema. IMPRESSION: No acute abnormality intracranially or in the cervical spine. Electronically Signed   By: Margaretha Sheffield M.D.   On: 02/26/2022 14:00   DG Pelvis Portable  Result Date: 02/26/2022 CLINICAL DATA:  Status post fall with right hip/pelvis pain EXAM: PORTABLE PELVIS 1 VIEWS COMPARISON:  Radiograph of the pelvis dated 04/20/2021 FINDINGS: There is no evidence of acute pelvic fracture or diastasis. Similar old fractures of the left superior  and inferior pubic rami. No pelvic bone lesions are seen. IMPRESSION: 1. No definite acute fracture. Diffuse demineralization limits evaluation for nondisplaced fracture. If there is continued clinical concern recommend further evaluation with cross-sectional imaging. 2. Similar old fractures of the left superior and inferior pubic rami. Electronically Signed   By: Darrin Nipper M.D.   On: 02/26/2022 13:47   DG Chest Port 1 View  Result Date: 02/26/2022 CLINICAL DATA:  Status post fall with right-sided pain EXAM: PORTABLE CHEST 1 VIEW COMPARISON:  Chest radiograph dated 05/10/2021 FINDINGS: Surgical chain staples project over the medial left apex. Normal lung volumes. Unchanged biapical pleural-parenchymal thickening. No focal consolidations. No pleural effusion or pneumothorax. The heart size and mediastinal contours are within normal limits. No radiographic finding of acute displaced fracture. IMPRESSION:  1. No acute cardiopulmonary process. 2. No radiographic finding of acute displaced fracture. Electronically Signed   By: Darrin Nipper M.D.   On: 02/26/2022 13:45    Procedures Procedures    Medications Ordered in ED Medications  Tdap (BOOSTRIX) injection 0.5 mL (0.5 mLs Intramuscular Given 02/26/22 1248)  acetaminophen (TYLENOL) tablet 650 mg (650 mg Oral Given 02/26/22 1249)    ED Course/ Medical Decision Making/ A&P                             Medical Decision Making Amount and/or Complexity of Data Reviewed Radiology: ordered.  Risk OTC drugs. Prescription drug management.   This patient presents to the ED for concern of fall, this involves an extensive number of treatment options, and is a complaint that carries with it a high risk of complications and morbidity.  The differential diagnosis includes acute injuries   Co morbidities that complicate the patient evaluation  dementia, CAD, GERD, HLD, HTN, osteoporosis, prior SDH   Additional history obtained:  Additional history  obtained from patient's son External records from outside source obtained and reviewed including EMR  Imaging Studies ordered:  I ordered imaging studies including x-ray of chest and pelvis, CT of head and cervical spine I independently visualized and interpreted imaging which showed no acute injuries I agree with the radiologist interpretation   Cardiac Monitoring: / EKG:  The patient was maintained on a cardiac monitor.  I personally viewed and interpreted the cardiac monitored which showed an underlying rhythm of: Sinus rhythm   Problem List / ED Course / Critical interventions / Medication management  Patient presenting after a fall at skilled nursing facility.  Vital signs on arrival are notable for hypertension.  Patient has dementia and history is provided primarily by her son.  Son confirms that he she is at her mental baseline.  She has a history of prior traumatic SDH and is not on any blood thinning medications currently.  On exam, there are no areas of obvious wound or swelling to her scalp.  Strength in all extremities is intact.  She does have a skin tear to her right elbow and forearm.  Area of right arm wound was cleansed and dressed with Xeroform and gauze.  Tylenol was ordered for analgesia.  Patient to undergo CT imaging of head and C-spine.  Per chart review, last tetanus was updated in 2011.  Son was agreeable to Tdap today and this was ordered.  Imaging studies did not show any acute findings.  While in the ED, patient's blood pressure was elevated.  Heart rate was low, in the 40s.  EKG shows sinus rhythm.  She is on metoprolol.  She was advised to hold metoprolol and continue to monitor heart rate for further discussion with her primary care doctor.  She is stable for discharge at this time. I ordered medication including Tylenol for analgesia; Tdap for tetanus prophylaxis Reevaluation of the patient after these medicines showed that the patient improved I have reviewed the  patients home medicines and have made adjustments as needed   Social Determinants of Health:  Resides in skilled nursing facility, has dementia, on hospice         Final Clinical Impression(s) / ED Diagnoses Final diagnoses:  Fall, initial encounter    Rx / DC Orders ED Discharge Orders     None         Godfrey Pick, MD 02/26/22 1510

## 2022-02-26 NOTE — ED Triage Notes (Signed)
Pt fell from bed; causing a skin tear on her right forearm.  Pt denies pain at time of arrival.

## 2022-03-24 ENCOUNTER — Other Ambulatory Visit: Payer: Self-pay

## 2022-03-24 ENCOUNTER — Emergency Department (HOSPITAL_COMMUNITY)
Admission: EM | Admit: 2022-03-24 | Discharge: 2022-03-24 | Disposition: A | Discharge status: Skilled Nursing Facility | Payer: Medicare Other | Attending: Student | Admitting: Student

## 2022-03-24 ENCOUNTER — Emergency Department (HOSPITAL_COMMUNITY): Payer: Medicare Other

## 2022-03-24 DIAGNOSIS — Z743 Need for continuous supervision: Secondary | ICD-10-CM | POA: Diagnosis not present

## 2022-03-24 DIAGNOSIS — E039 Hypothyroidism, unspecified: Secondary | ICD-10-CM | POA: Diagnosis not present

## 2022-03-24 DIAGNOSIS — I10 Essential (primary) hypertension: Secondary | ICD-10-CM | POA: Insufficient documentation

## 2022-03-24 DIAGNOSIS — I2510 Atherosclerotic heart disease of native coronary artery without angina pectoris: Secondary | ICD-10-CM | POA: Diagnosis not present

## 2022-03-24 DIAGNOSIS — Z79899 Other long term (current) drug therapy: Secondary | ICD-10-CM | POA: Diagnosis not present

## 2022-03-24 DIAGNOSIS — T148XXA Other injury of unspecified body region, initial encounter: Secondary | ICD-10-CM

## 2022-03-24 DIAGNOSIS — S51811A Laceration without foreign body of right forearm, initial encounter: Secondary | ICD-10-CM | POA: Diagnosis not present

## 2022-03-24 DIAGNOSIS — S59911A Unspecified injury of right forearm, initial encounter: Secondary | ICD-10-CM | POA: Diagnosis present

## 2022-03-24 DIAGNOSIS — D649 Anemia, unspecified: Secondary | ICD-10-CM | POA: Insufficient documentation

## 2022-03-24 DIAGNOSIS — R58 Hemorrhage, not elsewhere classified: Secondary | ICD-10-CM | POA: Diagnosis not present

## 2022-03-24 DIAGNOSIS — R609 Edema, unspecified: Secondary | ICD-10-CM | POA: Diagnosis not present

## 2022-03-24 DIAGNOSIS — E876 Hypokalemia: Secondary | ICD-10-CM | POA: Diagnosis not present

## 2022-03-24 DIAGNOSIS — W19XXXA Unspecified fall, initial encounter: Secondary | ICD-10-CM | POA: Diagnosis not present

## 2022-03-24 DIAGNOSIS — S0990XA Unspecified injury of head, initial encounter: Secondary | ICD-10-CM | POA: Diagnosis not present

## 2022-03-24 DIAGNOSIS — Y92129 Unspecified place in nursing home as the place of occurrence of the external cause: Secondary | ICD-10-CM | POA: Insufficient documentation

## 2022-03-24 DIAGNOSIS — R6889 Other general symptoms and signs: Secondary | ICD-10-CM | POA: Diagnosis not present

## 2022-03-24 DIAGNOSIS — S81012A Laceration without foreign body, left knee, initial encounter: Secondary | ICD-10-CM | POA: Diagnosis not present

## 2022-03-24 DIAGNOSIS — S199XXA Unspecified injury of neck, initial encounter: Secondary | ICD-10-CM | POA: Diagnosis not present

## 2022-03-24 DIAGNOSIS — Z87891 Personal history of nicotine dependence: Secondary | ICD-10-CM | POA: Insufficient documentation

## 2022-03-24 DIAGNOSIS — Z043 Encounter for examination and observation following other accident: Secondary | ICD-10-CM | POA: Diagnosis not present

## 2022-03-24 DIAGNOSIS — F0390 Unspecified dementia without behavioral disturbance: Secondary | ICD-10-CM | POA: Diagnosis not present

## 2022-03-24 LAB — URINALYSIS, ROUTINE W REFLEX MICROSCOPIC
Bacteria, UA: NONE SEEN
Bilirubin Urine: NEGATIVE
Glucose, UA: NEGATIVE mg/dL
Ketones, ur: NEGATIVE mg/dL
Leukocytes,Ua: NEGATIVE
Nitrite: NEGATIVE
Protein, ur: NEGATIVE mg/dL
Specific Gravity, Urine: 1.01 (ref 1.005–1.030)
pH: 6 (ref 5.0–8.0)

## 2022-03-24 LAB — IRON AND TIBC
Iron: 19 ug/dL — ABNORMAL LOW (ref 28–170)
Saturation Ratios: 5 % — ABNORMAL LOW (ref 10.4–31.8)
TIBC: 417 ug/dL (ref 250–450)
UIBC: 398 ug/dL

## 2022-03-24 LAB — CBC WITH DIFFERENTIAL/PLATELET
Abs Immature Granulocytes: 0.01 10*3/uL (ref 0.00–0.07)
Basophils Absolute: 0 10*3/uL (ref 0.0–0.1)
Basophils Relative: 1 %
Eosinophils Absolute: 0 10*3/uL (ref 0.0–0.5)
Eosinophils Relative: 1 %
HCT: 29.6 % — ABNORMAL LOW (ref 36.0–46.0)
Hemoglobin: 8.4 g/dL — ABNORMAL LOW (ref 12.0–15.0)
Immature Granulocytes: 0 %
Lymphocytes Relative: 36 %
Lymphs Abs: 1.6 10*3/uL (ref 0.7–4.0)
MCH: 23.4 pg — ABNORMAL LOW (ref 26.0–34.0)
MCHC: 28.4 g/dL — ABNORMAL LOW (ref 30.0–36.0)
MCV: 82.5 fL (ref 80.0–100.0)
Monocytes Absolute: 0.6 10*3/uL (ref 0.1–1.0)
Monocytes Relative: 14 %
Neutro Abs: 2.2 10*3/uL (ref 1.7–7.7)
Neutrophils Relative %: 48 %
Platelets: ADEQUATE 10*3/uL (ref 150–400)
RBC: 3.59 MIL/uL — ABNORMAL LOW (ref 3.87–5.11)
RDW: 16.1 % — ABNORMAL HIGH (ref 11.5–15.5)
WBC: 4.4 10*3/uL (ref 4.0–10.5)
nRBC: 0 % (ref 0.0–0.2)

## 2022-03-24 LAB — COMPREHENSIVE METABOLIC PANEL
ALT: 10 U/L (ref 0–44)
AST: 20 U/L (ref 15–41)
Albumin: 2.8 g/dL — ABNORMAL LOW (ref 3.5–5.0)
Alkaline Phosphatase: 92 U/L (ref 38–126)
Anion gap: 8 (ref 5–15)
BUN: 21 mg/dL (ref 8–23)
CO2: 19 mmol/L — ABNORMAL LOW (ref 22–32)
Calcium: 7.7 mg/dL — ABNORMAL LOW (ref 8.9–10.3)
Chloride: 114 mmol/L — ABNORMAL HIGH (ref 98–111)
Creatinine, Ser: 0.73 mg/dL (ref 0.44–1.00)
GFR, Estimated: >60 mL/min (ref >60)
Glucose, Bld: 81 mg/dL (ref 70–99)
Potassium: 3.1 mmol/L — ABNORMAL LOW (ref 3.5–5.1)
Sodium: 141 mmol/L (ref 135–145)
Total Bilirubin: 0.5 mg/dL (ref 0.3–1.2)
Total Protein: 5.4 g/dL — ABNORMAL LOW (ref 6.5–8.1)

## 2022-03-24 LAB — FERRITIN: Ferritin: 12 ng/mL (ref 11–307)

## 2022-03-24 LAB — CK: Total CK: 42 U/L (ref 38–234)

## 2022-03-24 MED ORDER — MAGNESIUM OXIDE -MG SUPPLEMENT 400 (240 MG) MG PO TABS
800.0000 mg | ORAL_TABLET | Freq: Once | ORAL | Status: AC
  Administered 2022-03-24: 800 mg via ORAL
  Filled 2022-03-24: qty 2

## 2022-03-24 MED ORDER — POTASSIUM CHLORIDE CRYS ER 20 MEQ PO TBCR
40.0000 meq | EXTENDED_RELEASE_TABLET | Freq: Once | ORAL | Status: AC
  Administered 2022-03-24: 40 meq via ORAL
  Filled 2022-03-24: qty 2

## 2022-03-24 NOTE — ED Provider Notes (Signed)
[facility] Provider Note  CSN: [id number] Arrival date & time: 03/24/22 [id number]  Chief Complaint(s) Fall  HPI Melissa Bentley is a 89 y.o. female with PMH HTN, HLD, hypothyroidism, CAD status post MI, remote subdural hematoma status postevacuation, iron deficiency anemia who presents emergency department for evaluation of an unwitnessed fall.  Patient reportedly found under her bed by facility staff.  Patient arrives with significant skin tears over the right forearm and arrives in a c-collar.  Patient with dementia at baseline and additional history unable to be obtained secondary to her underlying dementia   Past Medical History Past Medical History:  Diagnosis Date   CAD (coronary artery disease)    Depression    GERD (gastroesophageal reflux disease)    Hyperlipidemia    Hypertension    Hypothyroidism    MI (myocardial infarction) ([place])    Osteoporosis    Psoriasis    PVD (peripheral vascular disease) ([place])    Patient Active Problem List   Diagnosis Date Noted   Dementia with agitation ([place])    Protein-calorie malnutrition, moderate ([place]) 05/11/2021   Upper GI bleed 123456   Folic acid deficiency 123456   Chronic anemia 03/06/2021   Acquired hypothyroidism 03/06/2021   Recurrent depression ([place]) 03/06/2021   PVD (peripheral vascular disease) ([place]) 03/06/2021   Hypotension 03/05/2021   High anion gap metabolic acidosis 0000000   Hyponatremia 03/03/2021   Hypokalemia, hypomagnesemia and hypophosphatemia 03/03/2021   Symptomatic anemia 03/03/2021   Goals of care, counseling/discussion 03/03/2021   Esophageal dysmotility 03/03/2021   Physical deconditioning 03/03/2021   Failure to thrive due to dysphagia 03/01/2021   Hypoglycemia 03/01/2021   Protein-calorie malnutrition, severe ([place]) 03/01/2021   AKI (acute kidney injury) ([place]) 03/01/2021   Pressure injury of skin 10/05/2017   Subdural hematoma, chronic ([place])  10/03/2017   Diarrhea    Diverticulosis of colon without hemorrhage    Chronic diarrhea 01/17/2015   Home Medication(s) Prior to Admission medications   Medication Sig Start Date End Date Taking? Authorizing Provider  acetaminophen (TYLENOL) 500 MG tablet Take 500 mg by mouth every 4 (four) hours as needed for mild pain, fever or headache.    [provider]  alum & mag hydroxide-simeth (MAALOX/MYLANTA) 200-200-20 MG/5ML suspension Take 30 mLs by mouth every 6 (six) hours as needed for indigestion or heartburn.    [provider]  Janne Lab Oil [facility]) OINT Apply 1 application topically as directed. Apply to coccyx, sacrum and bilateral buttocks every shift.    [provider]  clonazePAM (KLONOPIN) 0.5 MG tablet Take 0.5 tablets (0.25 mg total) by mouth every 12 (twelve) hours as needed for anxiety. 05/14/21   Barton Dubois, MD  DULoxetine (CYMBALTA) 30 MG capsule Take 30 mg by mouth daily. 02/13/21   [provider]  feeding supplement (ENSURE ENLIVE / ENSURE PLUS) LIQD Take 237 mLs by mouth 2 (two) times daily between meals. 05/14/21   Barton Dubois, MD  fluticasone (FLONASE) 50 MCG/ACT nasal spray Place 2 sprays into both nostrils daily. 08/17/21   [provider]  folic acid (FOLVITE) 1 MG tablet Take 1 tablet (1 mg total) by mouth daily. 03/06/21   Mercy Riding, MD  guaiFENesin (ROBITUSSIN) 100 MG/5ML liquid Take 5 mLs by mouth every 6 (six) hours as needed for cough or to loosen phlegm.    [provider]  isosorbide mononitrate (ISMO) 10 MG tablet Take 0.5 tablets (5 mg total) by mouth 3 (three)  times daily before meals. 03/05/21   Mercy Riding, MD  levothyroxine (SYNTHROID) 50 MCG tablet Take 25 mcg by mouth daily. 07/01/18   [provider]  metoprolol tartrate (LOPRESSOR) 25 MG tablet Take 0.5 tablets (12.5 mg total) by mouth 2 (two) times daily. 03/05/21   Mercy Riding, MD  midodrine (PROAMATINE) 2.5 MG tablet Take 1  tablet (2.5 mg total) by mouth 3 (three) times daily with meals. 03/05/21   Mercy Riding, MD  mirtazapine (REMERON) 7.5 MG tablet Take 1 tablet (7.5 mg total) by mouth at bedtime. 05/14/21   Barton Dubois, MD  mupirocin ointment (BACTROBAN) 2 % Apply 1 Application topically daily. 07/17/21   [provider]  ondansetron (ZOFRAN-ODT) 8 MG disintegrating tablet Take 0.5 tablets (4 mg total) by mouth every 8 (eight) hours as needed for nausea or vomiting. Patient not taking: Reported on 09/13/2021 05/14/21   Barton Dubois, MD  pantoprazole (PROTONIX) 40 MG tablet Take 1 tablet (40 mg total) by mouth 2 (two) times daily. 05/14/21   Barton Dubois, MD                                                                                                                                    Past Surgical History Past Surgical History:  Procedure Laterality Date   ABDOMINAL HYSTERECTOMY     CATARACT EXTRACTION Bilateral    COLONOSCOPY N/A 02/01/2015   Dr. Rourk:colonic diverticulosis s/p biopsy with unremarkable colonic mucosa, no microscopic colitis.    CORONARY ANGIOPLASTY WITH STENT PLACEMENT     CRANIOTOMY Right 10/03/2017   Procedure: CRANIOTOMY HEMATOMA EVACUATION SUBDURAL;  Surgeon: Ashok Pall, MD;  Location: [facility];  Service: Neurosurgery;  Laterality: Right;   CRANIOTOMY Right 10/05/2017   Procedure: REDO CRANIOTOMY HEMATOMA EVACUATION SUBDURAL;  Surgeon: Ashok Pall, MD;  Location: [facility];  Service: Neurosurgery;  Laterality: Right;   ESOPHAGEAL DILATION  03/03/2021   Procedure: ESOPHAGEAL DILATION;  Surgeon: Rogene Houston, MD;  [facility];  Service: Endoscopy;;   ESOPHAGOGASTRODUODENOSCOPY (EGD) WITH PROPOFOL N/A 03/03/2021   Procedure: ESOPHAGOGASTRODUODENOSCOPY (EGD) WITH PROPOFOL;  Surgeon: Rogene Houston, MD;  [facility];  Service: Endoscopy;  Laterality: N/A;   EYE SURGERY     LUNG LOBECTOMY     SUBCLAVIAN ARTERY STENT     TUBAL LIGATION     Family  History Family History  Problem Relation Age of Onset   Lung cancer Sister    Macular degeneration Sister    Colon cancer Neg Hx     Social History Social History   Tobacco Use   Smoking status: Former    Types: Cigarettes    Quit date: 01/17/1996    Years since quitting: 26.2   Smokeless tobacco: Never  Vaping Use   Vaping Use: Never used  Substance Use Topics   Alcohol use: No    Alcohol/week: 0.0 standard drinks of alcohol  Drug use: No   Allergies Patient has no known allergies.  Review of Systems Review of Systems  Unable to perform ROS: Dementia    Physical Exam Vital Signs  I have reviewed the triage vital signs BP (!) 192/65   Pulse (!) 59   Temp (!) 97.5 F (36.4 C) (Axillary)   Resp 14   Ht 5' (1.524 m)   Wt 50 kg   SpO2 100%   BMI 21.53 kg/m   Physical Exam Vitals and nursing note reviewed.  Constitutional:      General: She is not in acute distress.    Appearance: She is well-developed.  HENT:     Head: Normocephalic and atraumatic.  Eyes:     Conjunctiva/sclera: Conjunctivae normal.  Cardiovascular:     Rate and Rhythm: Normal rate and regular rhythm.     Heart sounds: No murmur heard. Pulmonary:     Effort: Pulmonary effort is normal. No respiratory distress.     Breath sounds: Normal breath sounds.  Abdominal:     Palpations: Abdomen is soft.     Tenderness: There is no abdominal tenderness.  Musculoskeletal:        General: Swelling and tenderness present.     Cervical back: Neck supple.  Skin:    General: Skin is warm and dry.     Capillary Refill: Capillary refill takes less than 2 seconds.     Findings: Lesion present.  Neurological:     Mental Status: She is alert.  Psychiatric:        Mood and Affect: Mood normal.     ED Results and Treatments Labs (all labs ordered are listed, but only abnormal results are displayed) Labs Reviewed  COMPREHENSIVE METABOLIC PANEL - Abnormal; Notable for the following components:       Result Value   Potassium 3.1 (*)    Chloride 114 (*)    CO2 19 (*)    Calcium 7.7 (*)    Total Protein 5.4 (*)    Albumin 2.8 (*)    All other components within normal limits  CBC WITH DIFFERENTIAL/PLATELET - Abnormal; Notable for the following components:   RBC 3.59 (*)    Hemoglobin 8.4 (*)    HCT 29.6 (*)    MCH 23.4 (*)    MCHC 28.4 (*)    RDW 16.1 (*)    All other components within normal limits  URINALYSIS, ROUTINE W REFLEX MICROSCOPIC - Abnormal; Notable for the following components:   Color, Urine STRAW (*)    Hgb urine dipstick SMALL (*)    All other components within normal limits  CK  IRON AND TIBC  FERRITIN                                                                                                                          Radiology DG Forearm Right  Result Date: 03/24/2022 CLINICAL DATA:  Status post fall. EXAM: RIGHT FOREARM - 2 VIEW COMPARISON:  None Available. FINDINGS: There is no evidence of an acute fracture or dislocation. A chronic fracture deformity is seen involving the distal right radius. Soft tissues are unremarkable. IMPRESSION: Chronic fracture deformity of the distal right radius. Electronically Signed   By: Virgina Norfolk M.D.   On: 03/24/2022 03:26   DG Chest Portable 1 View  Result Date: 03/24/2022 CLINICAL DATA:  Status post fall. EXAM: PORTABLE CHEST 1 VIEW COMPARISON:  February 26, 2022 FINDINGS: The heart size and mediastinal contours are within normal limits. There is marked severity calcification of the thoracic aorta. Mild, chronic appearing increased lung markings are seen. Surgical sutures are noted along the medial aspect of the left apex. Biapical pleural thickening is also present. There is no evidence of an acute infiltrate, pleural effusion or pneumothorax. No acute osseous abnormalities are identified. IMPRESSION: Stable exam without evidence of acute or active cardiopulmonary disease. Electronically Signed   By: Virgina Norfolk  M.D.   On: 03/24/2022 03:24   DG Pelvis Portable  Result Date: 03/24/2022 CLINICAL DATA:  Status post fall. EXAM: PORTABLE PELVIS 1-2 VIEWS COMPARISON:  February 26, 2022 FINDINGS: There is no evidence of acute pelvic fracture or diastasis. Chronic fracture deformities are seen involving the left superior and left inferior pubic rami. Degenerative changes are seen involving both hips in the form of joint space narrowing and acetabular sclerosis. IMPRESSION: 1. Chronic fracture deformities of the left superior and left inferior pubic rami. 2. No acute osseous abnormality. Electronically Signed   By: Virgina Norfolk M.D.   On: 03/24/2022 03:22   CT HEAD WO CONTRAST (5MM)  Result Date: 03/24/2022 CLINICAL DATA:  Fall EXAM: CT HEAD WITHOUT CONTRAST CT CERVICAL SPINE WITHOUT CONTRAST TECHNIQUE: Multidetector CT imaging of the head and cervical spine was performed following the standard protocol without intravenous contrast. Multiplanar CT image reconstructions of the cervical spine were also generated. RADIATION DOSE REDUCTION: This exam was performed according to the departmental dose-optimization program which includes automated exposure control, adjustment of the mA and/or kV according to patient size and/or use of iterative reconstruction technique. COMPARISON:  None Available. FINDINGS: CT HEAD FINDINGS Brain: There is no mass, hemorrhage or extra-axial collection. There is generalized atrophy without lobar predilection. There is hypoattenuation of the periventricular white matter, most commonly indicating chronic ischemic microangiopathy. Old right temporal and parietal infarcts. Vascular: Atherosclerotic calcification of the internal carotid arteries at the skull base. No abnormal hyperdensity of the major intracranial arteries or dural venous sinuses. Skull: The visualized skull base, calvarium and extracranial soft tissues are normal. Sinuses/Orbits: No fluid levels or advanced mucosal thickening of the  visualized paranasal sinuses. No mastoid or middle ear effusion. The orbits are normal. CT CERVICAL SPINE FINDINGS Alignment: No static subluxation. Facets are aligned. Occipital condyles are normally positioned. Skull base and vertebrae: No acute fracture. Soft tissues and spinal canal: No prevertebral fluid or swelling. No visible canal hematoma. Disc levels: No advanced spinal canal or neural foraminal stenosis. Upper chest: Biapical emphysema Other: Normal visualized paraspinal cervical soft tissues. IMPRESSION: 1. No acute intracranial abnormality. 2. No acute fracture or static subluxation of the cervical spine. 3. Old right temporal and parietal infarcts. Electronically Signed   By: Ulyses Jarred M.D.   On: 03/24/2022 03:11   CT Cervical Spine Wo Contrast  Result Date: 03/24/2022 CLINICAL DATA:  Fall EXAM: CT HEAD WITHOUT CONTRAST CT CERVICAL SPINE WITHOUT CONTRAST TECHNIQUE: Multidetector CT imaging of the head and cervical spine was performed following the standard protocol without intravenous  contrast. Multiplanar CT image reconstructions of the cervical spine were also generated. RADIATION DOSE REDUCTION: This exam was performed according to the departmental dose-optimization program which includes automated exposure control, adjustment of the mA and/or kV according to patient size and/or use of iterative reconstruction technique. COMPARISON:  None Available. FINDINGS: CT HEAD FINDINGS Brain: There is no mass, hemorrhage or extra-axial collection. There is generalized atrophy without lobar predilection. There is hypoattenuation of the periventricular white matter, most commonly indicating chronic ischemic microangiopathy. Old right temporal and parietal infarcts. Vascular: Atherosclerotic calcification of the internal carotid arteries at the skull base. No abnormal hyperdensity of the major intracranial arteries or dural venous sinuses. Skull: The visualized skull base, calvarium and extracranial soft  tissues are normal. Sinuses/Orbits: No fluid levels or advanced mucosal thickening of the visualized paranasal sinuses. No mastoid or middle ear effusion. The orbits are normal. CT CERVICAL SPINE FINDINGS Alignment: No static subluxation. Facets are aligned. Occipital condyles are normally positioned. Skull base and vertebrae: No acute fracture. Soft tissues and spinal canal: No prevertebral fluid or swelling. No visible canal hematoma. Disc levels: No advanced spinal canal or neural foraminal stenosis. Upper chest: Biapical emphysema Other: Normal visualized paraspinal cervical soft tissues. IMPRESSION: 1. No acute intracranial abnormality. 2. No acute fracture or static subluxation of the cervical spine. 3. Old right temporal and parietal infarcts. Electronically Signed   By: Ulyses Jarred M.D.   On: 03/24/2022 03:11    Pertinent labs & imaging results that were available during my care of the patient were reviewed by me and considered in my medical decision making (see MDM for details).  Medications Ordered in ED Medications  potassium chloride SA (KLOR-CON M) CR tablet 40 mEq (has no administration in time range)  magnesium oxide (MAG-OX) tablet 800 mg (has no administration in time range)                                                                                                                                     Procedures Procedures  (including critical care time)  Medical Decision Making / ED Course   This patient presents to the ED for concern of fall, this involves an extensive number of treatment options, and is a complaint that carries with it a high risk of complications and morbidity.  The differential diagnosis includes fracture, ligamentous injury, hematoma, contusion, intracranial hemorrhage, less abnormality, UTI, symptomatic anemia  MDM: Patient seen emergency room for evaluation of an unwitnessed fall.  Physical exam with multiple skin tears and an associated hematoma to  the right forearm, small skin tear over the left knee.  Laboratory evaluation with mild hypokalemia to 3.1, CO2 19, albumin low at 2.8.  Electrolytes repleted in the emergency department.  Hemoglobin 8.4 which is a decrease from 2 months ago.  From previous hospitalizations for symptomatic anemia (specifically in April 2023), it appears patient has both iron deficiency anemia and with a  possible upper GI bleed but given patient's age and risk factors family declined EGD evaluation at that time.  Thus, I sent iron studies for PCP to follow-up on but will defer rectal exam as this will not currently change our management.  Trauma imaging reassuringly negative for acute injury and c-collar cleared.  Patient wound was dressed at bedside and patient discharged with outpatient follow-up  Additional history obtained:  -External records from outside source obtained and reviewed including: Chart review including previous notes, labs, imaging, consultation notes   Lab Tests: -I ordered, reviewed, and interpreted labs.   The pertinent results include:   Labs Reviewed  COMPREHENSIVE METABOLIC PANEL - Abnormal; Notable for the following components:      Result Value   Potassium 3.1 (*)    Chloride 114 (*)    CO2 19 (*)    Calcium 7.7 (*)    Total Protein 5.4 (*)    Albumin 2.8 (*)    All other components within normal limits  CBC WITH DIFFERENTIAL/PLATELET - Abnormal; Notable for the following components:   RBC 3.59 (*)    Hemoglobin 8.4 (*)    HCT 29.6 (*)    MCH 23.4 (*)    MCHC 28.4 (*)    RDW 16.1 (*)    All other components within normal limits  URINALYSIS, ROUTINE W REFLEX MICROSCOPIC - Abnormal; Notable for the following components:   Color, Urine STRAW (*)    Hgb urine dipstick SMALL (*)    All other components within normal limits  CK  IRON AND TIBC  FERRITIN      Imaging Studies ordered: I ordered imaging studies including CT head, C-spine, x-ray chest, forearm, pelvis I  independently visualized and interpreted imaging. I agree with the radiologist interpretation   Medicines ordered and prescription drug management: Meds ordered this encounter  Medications   potassium chloride SA (KLOR-CON M) CR tablet 40 mEq   magnesium oxide (MAG-OX) tablet 800 mg    -I have reviewed the patients home medicines and have made adjustments as needed  Critical interventions none    Cardiac Monitoring: The patient was maintained on a cardiac monitor.  I personally viewed and interpreted the cardiac monitored which showed an underlying rhythm of: NSR  Social Determinants of Health:  Factors impacting patients care include: none   Reevaluation: After the interventions noted above, I reevaluated the patient and found that they have :improved  Co morbidities that complicate the patient evaluation  Past Medical History:  Diagnosis Date   CAD (coronary artery disease)    Depression    GERD (gastroesophageal reflux disease)    Hyperlipidemia    Hypertension    Hypothyroidism    MI (myocardial infarction) ([place])    Osteoporosis    Psoriasis    PVD (peripheral vascular disease) ([place])       Dispostion: I considered admission for this patient, but at this time she does not meet inpatient criteria for admission she is safe for discharge with outpatient follow-up     Final Clinical Impression(s) / ED Diagnoses Final diagnoses:  Fall, initial encounter  Hematoma  Anemia, unspecified type     '@PCDICTATION'$ @    Jefry Lesinski, Debe Coder, MD 03/24/22 [id number]

## 2022-03-24 NOTE — ED Triage Notes (Signed)
Pt BIB [facility] from [facility]. EMS states they were called out for an unwitnessed fall, pt was found under her bed. Arrives in c-collar, skin tears noted to left knee, left hand and right forearm. Pt is demented at baseline.

## 2022-03-24 NOTE — ED Notes (Signed)
Notified [facility] of patient needing transportation back to [place],

## 2022-03-24 NOTE — Discharge Instructions (Signed)
This patient was seen in the emergency room for evaluation of a fall.  Her trauma workup was reassuringly negative for acute injuries but she does have multiple skin tears and hematoma to the right forearm.  This will likely require a compressive dressing, daily dressing changes and ice pack therapy.  She also has a developing anemia and will require PCP follow-up.  Iron studies have been sent for the primary care physician to follow-up on.  Return to the emergency department if patient has any additional concerning complaints including chest pain, shortness of breath, weakness or any other concerning symptoms

## 2022-04-02 DIAGNOSIS — K224 Dyskinesia of esophagus: Secondary | ICD-10-CM | POA: Diagnosis not present

## 2022-04-02 DIAGNOSIS — F0393 Unspecified dementia, unspecified severity, with mood disturbance: Secondary | ICD-10-CM | POA: Diagnosis not present

## 2022-04-02 DIAGNOSIS — E440 Moderate protein-calorie malnutrition: Secondary | ICD-10-CM | POA: Diagnosis not present

## 2022-04-02 DIAGNOSIS — D500 Iron deficiency anemia secondary to blood loss (chronic): Secondary | ICD-10-CM | POA: Diagnosis not present

## 2022-04-02 DIAGNOSIS — I9589 Other hypotension: Secondary | ICD-10-CM | POA: Diagnosis not present

## 2022-04-02 DIAGNOSIS — I959 Hypotension, unspecified: Secondary | ICD-10-CM | POA: Diagnosis not present

## 2022-04-02 DIAGNOSIS — K219 Gastro-esophageal reflux disease without esophagitis: Secondary | ICD-10-CM | POA: Diagnosis not present

## 2022-04-02 DIAGNOSIS — K922 Gastrointestinal hemorrhage, unspecified: Secondary | ICD-10-CM | POA: Diagnosis not present

## 2022-04-02 DIAGNOSIS — E039 Hypothyroidism, unspecified: Secondary | ICD-10-CM | POA: Diagnosis not present

## 2022-04-02 DIAGNOSIS — M5459 Other low back pain: Secondary | ICD-10-CM | POA: Diagnosis not present

## 2022-06-03 DIAGNOSIS — K219 Gastro-esophageal reflux disease without esophagitis: Secondary | ICD-10-CM | POA: Diagnosis not present

## 2022-06-03 DIAGNOSIS — E039 Hypothyroidism, unspecified: Secondary | ICD-10-CM | POA: Diagnosis not present

## 2022-06-03 DIAGNOSIS — J309 Allergic rhinitis, unspecified: Secondary | ICD-10-CM | POA: Diagnosis not present

## 2022-06-10 DIAGNOSIS — S51801A Unspecified open wound of right forearm, initial encounter: Secondary | ICD-10-CM | POA: Diagnosis not present

## 2022-06-10 DIAGNOSIS — W01198A Fall on same level from slipping, tripping and stumbling with subsequent striking against other object, initial encounter: Secondary | ICD-10-CM | POA: Diagnosis not present

## 2022-06-14 DIAGNOSIS — E039 Hypothyroidism, unspecified: Secondary | ICD-10-CM | POA: Diagnosis not present

## 2022-06-14 DIAGNOSIS — E782 Mixed hyperlipidemia: Secondary | ICD-10-CM | POA: Diagnosis not present

## 2022-06-14 DIAGNOSIS — D519 Vitamin B12 deficiency anemia, unspecified: Secondary | ICD-10-CM | POA: Diagnosis not present

## 2022-06-14 DIAGNOSIS — E559 Vitamin D deficiency, unspecified: Secondary | ICD-10-CM | POA: Diagnosis not present

## 2022-06-14 DIAGNOSIS — I10 Essential (primary) hypertension: Secondary | ICD-10-CM | POA: Diagnosis not present

## 2022-07-26 DIAGNOSIS — I209 Angina pectoris, unspecified: Secondary | ICD-10-CM | POA: Diagnosis not present

## 2022-07-26 DIAGNOSIS — I482 Chronic atrial fibrillation, unspecified: Secondary | ICD-10-CM | POA: Diagnosis not present

## 2022-07-29 DIAGNOSIS — I482 Chronic atrial fibrillation, unspecified: Secondary | ICD-10-CM | POA: Diagnosis not present

## 2022-07-29 DIAGNOSIS — R000 Tachycardia, unspecified: Secondary | ICD-10-CM | POA: Diagnosis not present

## 2022-07-29 DIAGNOSIS — K219 Gastro-esophageal reflux disease without esophagitis: Secondary | ICD-10-CM | POA: Diagnosis not present

## 2022-08-05 DIAGNOSIS — M545 Low back pain, unspecified: Secondary | ICD-10-CM | POA: Diagnosis not present

## 2022-08-05 DIAGNOSIS — I482 Chronic atrial fibrillation, unspecified: Secondary | ICD-10-CM | POA: Diagnosis not present

## 2022-08-26 DIAGNOSIS — D509 Iron deficiency anemia, unspecified: Secondary | ICD-10-CM | POA: Diagnosis not present

## 2022-08-26 DIAGNOSIS — E559 Vitamin D deficiency, unspecified: Secondary | ICD-10-CM | POA: Diagnosis not present

## 2022-08-26 DIAGNOSIS — E039 Hypothyroidism, unspecified: Secondary | ICD-10-CM | POA: Diagnosis not present

## 2022-09-15 ENCOUNTER — Emergency Department (HOSPITAL_COMMUNITY)
Admission: EM | Admit: 2022-09-15 | Discharge: 2022-09-15 | Disposition: A | Discharge status: Home / Self Care | Attending: Emergency Medicine | Admitting: Emergency Medicine

## 2022-09-15 ENCOUNTER — Other Ambulatory Visit: Payer: Self-pay

## 2022-09-15 ENCOUNTER — Emergency Department (HOSPITAL_COMMUNITY)

## 2022-09-15 DIAGNOSIS — S0993XA Unspecified injury of face, initial encounter: Secondary | ICD-10-CM | POA: Diagnosis present

## 2022-09-15 DIAGNOSIS — Z87891 Personal history of nicotine dependence: Secondary | ICD-10-CM | POA: Insufficient documentation

## 2022-09-15 DIAGNOSIS — S0083XA Contusion of other part of head, initial encounter: Secondary | ICD-10-CM | POA: Diagnosis not present

## 2022-09-15 DIAGNOSIS — W19XXXA Unspecified fall, initial encounter: Secondary | ICD-10-CM

## 2022-09-15 DIAGNOSIS — I2510 Atherosclerotic heart disease of native coronary artery without angina pectoris: Secondary | ICD-10-CM | POA: Insufficient documentation

## 2022-09-15 DIAGNOSIS — I10 Essential (primary) hypertension: Secondary | ICD-10-CM | POA: Insufficient documentation

## 2022-09-15 DIAGNOSIS — E039 Hypothyroidism, unspecified: Secondary | ICD-10-CM | POA: Insufficient documentation

## 2022-09-15 DIAGNOSIS — T148XXA Other injury of unspecified body region, initial encounter: Secondary | ICD-10-CM

## 2022-09-15 DIAGNOSIS — F0390 Unspecified dementia without behavioral disturbance: Secondary | ICD-10-CM | POA: Insufficient documentation

## 2022-09-15 DIAGNOSIS — Z743 Need for continuous supervision: Secondary | ICD-10-CM | POA: Diagnosis not present

## 2022-09-15 DIAGNOSIS — G319 Degenerative disease of nervous system, unspecified: Secondary | ICD-10-CM | POA: Diagnosis not present

## 2022-09-15 DIAGNOSIS — R58 Hemorrhage, not elsewhere classified: Secondary | ICD-10-CM | POA: Diagnosis not present

## 2022-09-15 DIAGNOSIS — S0012XA Contusion of left eyelid and periocular area, initial encounter: Secondary | ICD-10-CM | POA: Diagnosis not present

## 2022-09-15 DIAGNOSIS — I6782 Cerebral ischemia: Secondary | ICD-10-CM | POA: Diagnosis not present

## 2022-09-15 DIAGNOSIS — R6889 Other general symptoms and signs: Secondary | ICD-10-CM | POA: Diagnosis not present

## 2022-09-15 DIAGNOSIS — S0512XA Contusion of eyeball and orbital tissues, left eye, initial encounter: Secondary | ICD-10-CM | POA: Diagnosis not present

## 2022-09-15 MED ORDER — MORPHINE SULFATE (PF) 4 MG/ML IV SOLN
2.0000 mg | Freq: Once | INTRAVENOUS | Status: AC
  Administered 2022-09-15: 2 mg via INTRAMUSCULAR
  Filled 2022-09-15: qty 1

## 2022-09-15 MED ORDER — LIDOCAINE HCL (PF) 1 % IJ SOLN
INTRAMUSCULAR | Status: AC
  Filled 2022-09-15: qty 10

## 2022-09-15 NOTE — ED Notes (Signed)
Transportation called back to [place]

## 2022-09-15 NOTE — ED Triage Notes (Signed)
Patient from [facility], [place] for fall injuries. EMS reports unwitnessed fall; was found on the floor after crawling to the door. Patient has a lacerations and significant bruising and swelling noted to the L eye. Patient has a history of dementia. Upon arrival to ER, patient is alert and oriented x2; c-collar in place

## 2022-09-15 NOTE — Discharge Instructions (Signed)
You were evaluated in the Emergency Department and after careful evaluation, we did not find any emergent condition requiring admission or further testing in the hospital.  Your exam/testing today is overall reassuring.  CT scans did not show any significant internal bleeding or fractures.  You have a hematoma over your left eyebrow.  Recommend ice, cold compresses, Tylenol for discomfort.  Please return to the Emergency Department if you experience any worsening of your condition.   Thank you for allowing [place] to be a part of your care.

## 2022-09-15 NOTE — ED Provider Notes (Signed)
[facility]-EMERGENC[facility] DEPT [facility] Emergency Department Provider Note MRN:  [id number]  Arrival date & time: 09/15/22     Chief Complaint   Fall   History of Present Illness   Melissa Bentley is a 89 y.o. year-old female with a history of CAD, dementia presenting to the ED with chief complaint of fall.  Unwitnessed fall at facility.  Trauma to the left brow.  Review of Systems  I was unable to obtain a full/accurate HPI, PMH, or ROS due to the patient's dementia.  Patient's Health History    Past Medical History:  Diagnosis Date   CAD (coronary artery disease)    Depression    GERD (gastroesophageal reflux disease)    Hyperlipidemia    Hypertension    Hypothyroidism    MI (myocardial infarction) (HCC)    Osteoporosis    Psoriasis    PVD (peripheral vascular disease) (HCC)     Past Surgical History:  Procedure Laterality Date   ABDOMINAL HYSTERECTOMY     CATARACT EXTRACTION Bilateral    COLONOSCOPY N/A 02/01/2015   Dr. Rourk:colonic diverticulosis s/p biopsy with unremarkable colonic mucosa, no microscopic colitis.    CORONARY ANGIOPLASTY WITH STENT PLACEMENT     CRANIOTOMY Right 10/03/2017   Procedure: CRANIOTOMY HEMATOMA EVACUATION SUBDURAL;  Surgeon: Coletta Memos, MD;  Location: [facility] OR;  Service: Neurosurgery;  Laterality: Right;   CRANIOTOMY Right 10/05/2017   Procedure: REDO CRANIOTOMY HEMATOMA EVACUATION SUBDURAL;  Surgeon: Coletta Memos, MD;  Location: [facility] OR;  Service: Neurosurgery;  Laterality: Right;   ESOPHAGEAL DILATION  03/03/2021   Procedure: ESOPHAGEAL DILATION;  Surgeon: Malissa Hippo, MD;  Location: [facility] ENDO SUITE;  Service: Endoscopy;;   ESOPHAGOGASTRODUODENOSCOPY (EGD) WITH PROPOFOL N/A 03/03/2021   Procedure: ESOPHAGOGASTRODUODENOSCOPY (EGD) WITH PROPOFOL;  Surgeon: Malissa Hippo, MD;  Location: [facility] ENDO [facility];  Service: Endoscopy;  Laterality: N/A;   EYE SURGERY     LUNG LOBECTOMY     SUBCLAVIAN ARTERY STENT     TUBAL LIGATION      Family History   Problem Relation Age of Onset   Lung cancer Sister    Macular degeneration Sister    Colon cancer Neg Hx     Social History   Socioeconomic History   Marital status: Widowed    Spouse name: Not on file   Number of children: 2   Years of education: colllege education   Highest education level: Associate degree: occupational, Scientist, product/process development, or vocational program  Occupational History   Occupation: retired Designer, industrial/product    Comment: disabled   Tobacco Use   Smoking status: Former    Current packs/day: 0.00    Types: Cigarettes    Quit date: 01/17/1996    Years since quitting: 26.6   Smokeless tobacco: Never  Vaping Use   Vaping status: Never Used  Substance and Sexual Activity   Alcohol use: No    Alcohol/week: 0.0 standard drinks of alcohol   Drug use: No   Sexual activity: Not on file  Other Topics Concern   Not on file  Social History Narrative   Not on file   Social Determinants of Health   Financial Resource Strain: Low Risk  (03/12/2019)   Overall Financial Resource Strain (CARDIA)    Difficulty of Paying Living Expenses: Not hard at all  Food Insecurity: No Food Insecurity (03/12/2019)   Hunger Vital Sign    Worried About Running Out of Food in the Last Year: Never true    Ran Out of Food  in the Last Year: Never true  Transportation Needs: No Transportation Needs (03/12/2019)   PRAPARE - Administrator, Civil Service (Medical): No    Lack of Transportation (Non-Medical): No  Physical Activity: Not on file  Stress: Not on file  Social Connections: Moderately Integrated (03/12/2019)   Social Connection and Isolation Panel [NHANES]    Frequency of Communication with Friends and Family: More than three times a week    Frequency of Social Gatherings with Friends and Family: More than three times a week    Attends Religious Services: 1 to 4 times per year    Active Member of [employer] or Organizations: Yes    Attends Banker Meetings: 1 to 4 times per  year    Marital Status: Widowed  Intimate Partner Violence: Not on file     Physical Exam   Vitals:   09/15/22 0545 09/15/22 0600  BP: (!) 199/109 (!) 201/98  Pulse: 99 95  Resp: (!) 22 20  SpO2: 93% 95%    CONSTITUTIONAL: Chronically ill-appearing, NAD NEURO/PSYCH:  Alert and oriented x 3, no focal deficits EYES:  eyes equal and reactive ENT/NECK:  no LAD, no JVD CARDIO: Regular rate, well-perfused, normal S1 and S2 PULM:  CTAB no wheezing or rhonchi GI/GU:  non-distended, non-tender MSK/SPINE:  No gross deformities, no edema SKIN: Large hematoma to the left brow with overlying abrasion   *Additional and/or pertinent findings included in MDM below  Diagnostic and Interventional Summary    EKG Interpretation Date/Time:  Sunday September 15 2022 04:37:05 EDT Ventricular Rate:  75 PR Interval:    QRS Duration:  87 QT Interval:  483 QTC Calculation: 540 R Axis:   135  Text Interpretation: Age not entered, assumed to be  89 years old for purpose of ECG interpretation Atrial flutter with varied AV block, Right axis deviation Abnormal T, consider ischemia, diffuse leads Prolonged QT interval Confirmed by Kennis Carina [phone]) on 09/15/2022 5:49:32 AM       Labs Reviewed - No data to display  CT HEAD WO CONTRAST ( )  Final Result    CT CERVICAL SPINE WO CONTRAST  Final Result    CT MAXILLOFACIAL WO CONTRAST  Final Result      Medications  lidocaine (PF) (XYLOCAINE) 1 % injection (has no administration in time range)  morphine (PF) 4 MG/ML injection 2 mg (2 mg Intramuscular Given 09/15/22 0527)     Procedures  /  Critical Care Procedures  ED Course and Medical Decision Making  Initial Impression and Ddx Head trauma, suspected mechanical fall.  Awaiting CT imaging.  Patient has normal range of motion of the arms and legs, no tenderness to the hips, abdomen soft nontender, lungs clear and present with no chest palpation tenderness.  Doubt any other significant  traumatic injury.  Past medical/surgical history that increases complexity of ED encounter: Hypertension, dementia  Interpretation of Diagnostics CT imaging is without fracture or intracranial bleeding.  Patient Reassessment and Ultimate Disposition/Management     Spoke with patient's son to make him aware, no emergent process patient is appropriate for discharge.  Patient management required discussion with the following services or consulting groups:  None  Complexity of Problems Addressed Acute illness or injury that poses threat of life of bodily function  Additional Data Reviewed and Analyzed Further history obtained from: EMS on arrival  Additional Factors Impacting ED Encounter Risk Use of parenteral controlled substances  Elmer Sow. Pilar Plate, MD [facility] Emergency Medicine [facility]  [facility] [email or web address]  Final Clinical Impressions(s) / ED Diagnoses     ICD-10-CM   1. Fall, initial encounter  W19.XXXA     2. Hematoma  [email or web address]       ED Discharge Orders     None        Discharge Instructions Discussed with and Provided to Patient:     Discharge Instructions      You were evaluated in the Emergency Department and after careful evaluation, we did not find any emergent condition requiring admission or further testing in the hospital.  Your exam/testing today is overall reassuring.  CT scans did not show any significant internal bleeding or fractures.  You have a hematoma over your left eyebrow.  Recommend ice, cold compresses, Tylenol for discomfort.  Please return to the Emergency Department if you experience any worsening of your condition.   Thank you for allowing [place] to be a part of your care.       Sabas Sous, MD 09/15/22 [phone]

## 2022-09-19 ENCOUNTER — Emergency Department (HOSPITAL_COMMUNITY)

## 2022-09-19 ENCOUNTER — Other Ambulatory Visit: Payer: Self-pay

## 2022-09-19 ENCOUNTER — Telehealth (HOSPITAL_COMMUNITY): Payer: Self-pay | Admitting: Emergency Medicine

## 2022-09-19 ENCOUNTER — Emergency Department (HOSPITAL_COMMUNITY)
Admission: EM | Admit: 2022-09-19 | Discharge: 2022-09-19 | Disposition: A | Discharge status: Home / Self Care | Attending: Emergency Medicine | Admitting: Emergency Medicine

## 2022-09-19 ENCOUNTER — Encounter (HOSPITAL_COMMUNITY): Payer: Self-pay | Admitting: *Deleted

## 2022-09-19 DIAGNOSIS — I1A0 Resistant hypertension: Secondary | ICD-10-CM | POA: Diagnosis not present

## 2022-09-19 DIAGNOSIS — R000 Tachycardia, unspecified: Secondary | ICD-10-CM | POA: Insufficient documentation

## 2022-09-19 DIAGNOSIS — M50322 Other cervical disc degeneration at C5-C6 level: Secondary | ICD-10-CM | POA: Diagnosis not present

## 2022-09-19 DIAGNOSIS — W19XXXA Unspecified fall, initial encounter: Secondary | ICD-10-CM | POA: Diagnosis not present

## 2022-09-19 DIAGNOSIS — S0990XA Unspecified injury of head, initial encounter: Secondary | ICD-10-CM | POA: Diagnosis not present

## 2022-09-19 DIAGNOSIS — R410 Disorientation, unspecified: Secondary | ICD-10-CM | POA: Diagnosis not present

## 2022-09-19 DIAGNOSIS — F0390 Unspecified dementia without behavioral disturbance: Secondary | ICD-10-CM | POA: Diagnosis not present

## 2022-09-19 DIAGNOSIS — Z743 Need for continuous supervision: Secondary | ICD-10-CM | POA: Diagnosis not present

## 2022-09-19 DIAGNOSIS — I700 Atherosclerosis of aorta: Secondary | ICD-10-CM | POA: Diagnosis not present

## 2022-09-19 DIAGNOSIS — S12500A Unspecified displaced fracture of sixth cervical vertebra, initial encounter for closed fracture: Secondary | ICD-10-CM | POA: Diagnosis not present

## 2022-09-19 DIAGNOSIS — G4489 Other headache syndrome: Secondary | ICD-10-CM | POA: Diagnosis not present

## 2022-09-19 DIAGNOSIS — S0003XA Contusion of scalp, initial encounter: Secondary | ICD-10-CM | POA: Diagnosis not present

## 2022-09-19 DIAGNOSIS — Z043 Encounter for examination and observation following other accident: Secondary | ICD-10-CM | POA: Diagnosis not present

## 2022-09-19 DIAGNOSIS — I517 Cardiomegaly: Secondary | ICD-10-CM | POA: Diagnosis not present

## 2022-09-19 DIAGNOSIS — S0083XA Contusion of other part of head, initial encounter: Secondary | ICD-10-CM | POA: Diagnosis not present

## 2022-09-19 DIAGNOSIS — R6889 Other general symptoms and signs: Secondary | ICD-10-CM | POA: Diagnosis not present

## 2022-09-19 DIAGNOSIS — I10 Essential (primary) hypertension: Secondary | ICD-10-CM | POA: Diagnosis not present

## 2022-09-19 DIAGNOSIS — M5021 Other cervical disc displacement,  high cervical region: Secondary | ICD-10-CM | POA: Diagnosis not present

## 2022-09-19 LAB — URINALYSIS, ROUTINE W REFLEX MICROSCOPIC
Bacteria, UA: NONE SEEN
Bilirubin Urine: NEGATIVE
Glucose, UA: NEGATIVE mg/dL
Hgb urine dipstick: NEGATIVE
Ketones, ur: 5 mg/dL — AB
Nitrite: NEGATIVE
Protein, ur: NEGATIVE mg/dL
Specific Gravity, Urine: 1.016 (ref 1.005–1.030)
WBC, UA: >50 WBC/hpf (ref 0–5)
pH: 5 (ref 5.0–8.0)

## 2022-09-19 LAB — CBC WITH DIFFERENTIAL/PLATELET
Abs Immature Granulocytes: 0.04 10*3/uL (ref 0.00–0.07)
Basophils Absolute: 0 10*3/uL (ref 0.0–0.1)
Basophils Relative: 1 %
Eosinophils Absolute: 0 10*3/uL (ref 0.0–0.5)
Eosinophils Relative: 0 %
HCT: 54.2 % — ABNORMAL HIGH (ref 36.0–46.0)
Hemoglobin: 17.1 g/dL — ABNORMAL HIGH (ref 12.0–15.0)
Immature Granulocytes: 1 %
Lymphocytes Relative: 32 %
Lymphs Abs: 1.9 10*3/uL (ref 0.7–4.0)
MCH: 28.4 pg (ref 26.0–34.0)
MCHC: 31.5 g/dL (ref 30.0–36.0)
MCV: 90 fL (ref 80.0–100.0)
Monocytes Absolute: 0.5 10*3/uL (ref 0.1–1.0)
Monocytes Relative: 8 %
Neutro Abs: 3.5 10*3/uL (ref 1.7–7.7)
Neutrophils Relative %: 58 %
Platelets: 145 10*3/uL — ABNORMAL LOW (ref 150–400)
RBC: 6.02 MIL/uL — ABNORMAL HIGH (ref 3.87–5.11)
RDW: 21.3 % — ABNORMAL HIGH (ref 11.5–15.5)
WBC: 5.9 10*3/uL (ref 4.0–10.5)
nRBC: 0 % (ref 0.0–0.2)

## 2022-09-19 LAB — BASIC METABOLIC PANEL
Anion gap: 9 (ref 5–15)
BUN: 24 mg/dL — ABNORMAL HIGH (ref 8–23)
CO2: 26 mmol/L (ref 22–32)
Calcium: 10 mg/dL (ref 8.9–10.3)
Chloride: 104 mmol/L (ref 98–111)
Creatinine, Ser: 1.09 mg/dL — ABNORMAL HIGH (ref 0.44–1.00)
GFR, Estimated: 47 mL/min — ABNORMAL LOW (ref >60)
Glucose, Bld: 90 mg/dL (ref 70–99)
Potassium: 4.5 mmol/L (ref 3.5–5.1)
Sodium: 139 mmol/L (ref 135–145)

## 2022-09-19 MED ORDER — FENTANYL CITRATE PF 50 MCG/ML IJ SOSY
50.0000 ug | PREFILLED_SYRINGE | Freq: Once | INTRAMUSCULAR | Status: AC
  Administered 2022-09-19: 50 ug via INTRAVENOUS
  Filled 2022-09-19: qty 1

## 2022-09-19 MED ORDER — METOPROLOL TARTRATE 25 MG PO TABS
12.5000 mg | ORAL_TABLET | Freq: Once | ORAL | Status: AC
  Administered 2022-09-19: 12.5 mg via ORAL
  Filled 2022-09-19: qty 1

## 2022-09-19 MED ORDER — SODIUM CHLORIDE 0.9 % IV BOLUS
1000.0000 mL | Freq: Once | INTRAVENOUS | Status: AC
  Administered 2022-09-19: 1000 mL via INTRAVENOUS

## 2022-09-19 NOTE — Discharge Instructions (Signed)
You were seen in the emergency department for another fall.  You had a CAT scan of your head face and neck along with x-rays of your chest and pelvis.  There were no signs of intracranial bleeding.  Please continue your regular medications.  Follow-up with your primary care doctor.  Return to the emergency department if any worsening or concerning symptoms.

## 2022-09-19 NOTE — ED Triage Notes (Signed)
Pt BIB [facility] from [facility] for falls; pt has had multiple unwittnessed falls over the last few weeks; pt has hematoma to back of head  Pt has multiple bruises to face in different stages of healing

## 2022-09-19 NOTE — ED Provider Notes (Signed)
[place] [facility] AT [facility] Provider Note   CSN: [id number] Arrival date & time: 09/19/22  8295     History  Chief Complaint  Patient presents with   Marletta Lor    Melissa Bentley is a 89 y.o. female.  Level 5 caveat secondary to dementia.  Patient brought in by ambulance from her facility after unwitnessed fall.  Per EMS patient has had multiple falls over the past few weeks.  She was seen here 4 days ago for a fall with facial contusion.  CT head cervical spine and max face were negative for fractures.  Patient unable to give any history.  EMS placed in cervical collar for mechanism.  The history is provided by the EMS personnel and the patient.  Fall This is a recurrent problem. The problem has not changed since onset.Nothing aggravates the symptoms. Nothing relieves the symptoms. She has tried nothing for the symptoms. The treatment provided no relief.       Home Medications Prior to Admission medications   Medication Sig Start Date End Date Taking? Authorizing Provider  acetaminophen (TYLENOL) 500 MG tablet Take 500 mg by mouth every 4 (four) hours as needed for mild pain, fever or headache.    [provider]  alum & mag hydroxide-simeth (MAALOX/MYLANTA) 200-200-20 MG/5ML suspension Take 30 mLs by mouth every 6 (six) hours as needed for indigestion or heartburn.    [provider]  [facility]) OINT Apply 1 application topically as directed. Apply to coccyx, sacrum and bilateral buttocks every shift.    [provider]  clonazePAM (KLONOPIN) 0.5 MG tablet Take 0.5 tablets (0.25 mg total) by mouth every 12 (twelve) hours as needed for anxiety. 05/14/21   Vassie Loll, MD  DULoxetine (CYMBALTA) 30 MG capsule Take 30 mg by mouth daily. 02/13/21   [provider]  feeding supplement (ENSURE ENLIVE / ENSURE PLUS) LIQD Take 237 mLs by mouth 2 (two) times daily between meals. 05/14/21   Vassie Loll, MD   fluticasone (FLONASE) 50 MCG/ACT nasal spray Place 2 sprays into both nostrils daily. 08/17/21   [provider]  folic acid (FOLVITE) 1 MG tablet Take 1 tablet (1 mg total) by mouth daily. 03/06/21   Almon Hercules, MD  guaiFENesin (ROBITUSSIN) 100 MG/5ML liquid Take 5 mLs by mouth every 6 (six) hours as needed for cough or to loosen phlegm.    [provider]  isosorbide mononitrate (ISMO) 10 MG tablet Take 0.5 tablets (5 mg total) by mouth 3 (three) times daily before meals. 03/05/21   Almon Hercules, MD  levothyroxine (SYNTHROID) 50 MCG tablet Take 25 mcg by mouth daily. 07/01/18   [provider]  metoprolol tartrate (LOPRESSOR) 25 MG tablet Take 0.5 tablets (12.5 mg total) by mouth 2 (two) times daily. 03/05/21   Almon Hercules, MD  midodrine (PROAMATINE) 2.5 MG tablet Take 1 tablet (2.5 mg total) by mouth 3 (three) times daily with meals. 03/05/21   Almon Hercules, MD  mirtazapine (REMERON) 7.5 MG tablet Take 1 tablet (7.5 mg total) by mouth at bedtime. 05/14/21   Vassie Loll, MD  mupirocin ointment (BACTROBAN) 2 % Apply 1 Application topically daily. 07/17/21   [provider]  ondansetron (ZOFRAN-ODT) 8 MG disintegrating tablet Take 0.5 tablets (4 mg total) by mouth every 8 (eight) hours as needed for nausea or vomiting. Patient not taking: Reported on 09/13/2021 05/14/21   Vassie Loll, MD  pantoprazole (PROTONIX) 40 MG tablet  Take 1 tablet (40 mg total) by mouth 2 (two) times daily. 05/14/21   Vassie Loll, MD      Allergies    Patient has no known allergies.    Review of Systems   Review of Systems  Unable to perform ROS: Dementia    Physical Exam Updated Vital Signs BP (!) 169/109   Pulse (!) 101   Temp 98 F (36.7 C)   Resp 18   Ht 5' (1.524 m)   Wt 50 kg   SpO2 92%   BMI 21.53 kg/m  Physical Exam Vitals and nursing note reviewed.  Constitutional:      General: She is not in acute distress.    Appearance: Normal appearance. She is  well-developed.  HENT:     Head: Normocephalic.     Comments: Patient has extensive bruising around face with a large hematoma above her left eye.  She has a large hematoma on her right parietal scalp. Eyes:     Conjunctiva/sclera: Conjunctivae normal.  Neck:     Comments: In cervical collar trach midline Cardiovascular:     Rate and Rhythm: Regular rhythm. Tachycardia present.     Heart sounds: No murmur heard. Pulmonary:     Effort: Pulmonary effort is normal. No respiratory distress.     Breath sounds: Normal breath sounds.  Abdominal:     Palpations: Abdomen is soft.     Tenderness: There is no abdominal tenderness. There is no guarding or rebound.  Musculoskeletal:        General: No deformity. Normal range of motion.     Cervical back: No tenderness.     Comments: She has range of motion of her upper and lower extremities without significant tenderness.   Skin:    General: Skin is warm and dry.     Capillary Refill: Capillary refill takes less than 2 seconds.  Neurological:     General: No focal deficit present.     Mental Status: She is alert.     Motor: No weakness.     ED Results / Procedures / Treatments   Labs (all labs ordered are listed, but only abnormal results are displayed) Labs Reviewed  BASIC METABOLIC PANEL - Abnormal; Notable for the following components:      Result Value   BUN 24 (*)    Creatinine, Ser 1.09 (*)    GFR, Estimated 47 (*)    All other components within normal limits  CBC WITH DIFFERENTIAL/PLATELET - Abnormal; Notable for the following components:   RBC 6.02 (*)    Hemoglobin 17.1 (*)    HCT 54.2 (*)    RDW 21.3 (*)    Platelets 145 (*)    All other components within normal limits  URINALYSIS, ROUTINE W REFLEX MICROSCOPIC - Abnormal; Notable for the following components:   Ketones, ur 5 (*)    Leukocytes,Ua MODERATE (*)    All other components within normal limits    EKG EKG Interpretation Date/Time:  Thursday September 19 2022  06:54:25 EDT Ventricular Rate:  95 PR Interval:    QRS Duration:  89 QT Interval:  339 QTC Calculation: 427 R Axis:   109  Text Interpretation: Atrial flutter with predominant 3:1 AV block Right axis deviation Borderline repolarization abnormality No significant change since prior 8/24 Confirmed by Meridee Score [phone]) on 09/19/2022 7:16:24 AM  Radiology CT Head Wo Contrast  Addendum Date: 09/19/2022   ADDENDUM REPORT: 09/19/2022 15:11 ADDENDUM: Upon further review, there is a small  displaced fracture of the anteroinferior corner of the C6 vertebral body (series 5, image 33). This was present on the prior cervical spine CT of 09/15/2022, but is new from the prior cervical spine CT of 03/24/2022. A cervical spine MRI is recommended to exclude an associated anterior longitudinal ligament injury. These results were called by telephone at the time of interpretation on 09/19/2022 at 3:10 pm to provider [facility] , who verbally acknowledged these results. Electronically Signed   By: Jackey Loge D.O.   On: 09/19/2022 15:11   Result Date: 09/19/2022 CLINICAL DATA:  Provided history: Head trauma, minor. Facial trauma, blunt. Neck trauma. Multiple falls (with head trauma), swelling to forehead, periorbital bruising. EXAM: CT HEAD WITHOUT CONTRAST CT MAXILLOFACIAL WITHOUT CONTRAST CT CERVICAL SPINE WITHOUT CONTRAST TECHNIQUE: Multidetector CT imaging of the head, cervical spine, and maxillofacial structures were performed using the standard protocol without intravenous contrast. Multiplanar CT image reconstructions of the cervical spine and maxillofacial structures were also generated. RADIATION DOSE REDUCTION: This exam was performed according to the departmental dose-optimization program which includes automated exposure control, adjustment of the mA and/or kV according to patient size and/or use of iterative reconstruction technique. COMPARISON:  Head CT 09/15/2022. Maxillofacial CT 09/15/2022.  Maxillofacial CT 01/10/2022. Cervical spine CT 09/15/2022. FINDINGS: CT HEAD FINDINGS Brain: Generalized cerebral atrophy. Redemonstrated large focus of chronic encephalomalacia/gliosis within the right parietal and temporal lobes (deep to a cranioplasty). Background patchy and ill-defined hypoattenuation within the cerebral white matter, nonspecific but compatible with advanced chronic small vessel ischemic disease. There is no acute intracranial hemorrhage. No acute demarcated cortical infarct. No extra-axial fluid collection. No evidence of an intracranial mass. No midline shift. Vascular: No hyperdense vessel.  Atherosclerotic calcifications. Skull: Right parietal cranioplasty.  No acute calvarial fracture. Other: Right parietal scalp hematoma. CT MAXILLOFACIAL FINDINGS Osseous: Redemonstrated chronic nasal bone fracture deformity. No acute maxillofacial fracture is identified. Orbits: Bilateral periorbital hematomas. No acute finding within the orbits. Sinuses: No significant paranasal sinus disease. Soft tissues: Left forehead and bilateral periorbital hematomas. CT CERVICAL SPINE FINDINGS Alignment: Mild C2-C3 grade 1 retrolisthesis. Mild grade 1 anterolisthesis at C4-C5, C6-C7, C7-T1, T1-T2 and T2-T3. Skull base and vertebrae: The basion-dental and atlanto-dental intervals are maintained.No evidence of acute fracture to the cervical spine. Soft tissues and spinal canal: No prevertebral fluid or swelling. No visible canal hematoma. Disc levels: Cervical spondylosis with multilevel disc space narrowing, disc bulges/central disc protrusions, posterior disc osteophyte complexes, uncovertebral hypertrophy and facet arthrosis. Disc space narrowing is greatest at C5-C6 and C6-C7 (advanced at these levels). No appreciable high-grade spinal canal stenosis. Multilevel bony neural foraminal narrowing. Degenerative changes also present at the C1-C2 articulation. Upper chest: No consolidation within the imaged lung  apices. Emphysema. Postoperative changes to the left lung apex. Biapical pleuroparenchymal scarring. IMPRESSION: CT head: 1. No evidence of an acute intracranial abnormality. 2. Right parietal scalp hematoma. CT maxillofacial: 1. No evidence of an acute maxillofacial fracture. 2. Left forehead and bilateral periorbital hematomas. CT cervical spine: 1. No evidence of an acute cervical spine fracture. 2. Mild multilevel grade 1 spondylolisthesis. 3. Cervical spondylosis as described. 4. Emphysema (ICD10-J43.9). Electronically Signed: By: Jackey Loge D.O. On: 09/19/2022 08:44   CT Cervical Spine Wo Contrast  Addendum Date: 09/19/2022   ADDENDUM REPORT: 09/19/2022 15:11 ADDENDUM: Upon further review, there is a small displaced fracture of the anteroinferior corner of the C6 vertebral body (series 5, image 33). This was present on the prior cervical spine CT of 09/15/2022, but is new  from the prior cervical spine CT of 03/24/2022. A cervical spine MRI is recommended to exclude an associated anterior longitudinal ligament injury. These results were called by telephone at the time of interpretation on 09/19/2022 at 3:10 pm to provider [facility] , who verbally acknowledged these results. Electronically Signed   By: Jackey Loge D.O.   On: 09/19/2022 15:11   Result Date: 09/19/2022 CLINICAL DATA:  Provided history: Head trauma, minor. Facial trauma, blunt. Neck trauma. Multiple falls (with head trauma), swelling to forehead, periorbital bruising. EXAM: CT HEAD WITHOUT CONTRAST CT MAXILLOFACIAL WITHOUT CONTRAST CT CERVICAL SPINE WITHOUT CONTRAST TECHNIQUE: Multidetector CT imaging of the head, cervical spine, and maxillofacial structures were performed using the standard protocol without intravenous contrast. Multiplanar CT image reconstructions of the cervical spine and maxillofacial structures were also generated. RADIATION DOSE REDUCTION: This exam was performed according to the departmental dose-optimization  program which includes automated exposure control, adjustment of the mA and/or kV according to patient size and/or use of iterative reconstruction technique. COMPARISON:  Head CT 09/15/2022. Maxillofacial CT 09/15/2022. Maxillofacial CT 01/10/2022. Cervical spine CT 09/15/2022. FINDINGS: CT HEAD FINDINGS Brain: Generalized cerebral atrophy. Redemonstrated large focus of chronic encephalomalacia/gliosis within the right parietal and temporal lobes (deep to a cranioplasty). Background patchy and ill-defined hypoattenuation within the cerebral white matter, nonspecific but compatible with advanced chronic small vessel ischemic disease. There is no acute intracranial hemorrhage. No acute demarcated cortical infarct. No extra-axial fluid collection. No evidence of an intracranial mass. No midline shift. Vascular: No hyperdense vessel.  Atherosclerotic calcifications. Skull: Right parietal cranioplasty.  No acute calvarial fracture. Other: Right parietal scalp hematoma. CT MAXILLOFACIAL FINDINGS Osseous: Redemonstrated chronic nasal bone fracture deformity. No acute maxillofacial fracture is identified. Orbits: Bilateral periorbital hematomas. No acute finding within the orbits. Sinuses: No significant paranasal sinus disease. Soft tissues: Left forehead and bilateral periorbital hematomas. CT CERVICAL SPINE FINDINGS Alignment: Mild C2-C3 grade 1 retrolisthesis. Mild grade 1 anterolisthesis at C4-C5, C6-C7, C7-T1, T1-T2 and T2-T3. Skull base and vertebrae: The basion-dental and atlanto-dental intervals are maintained.No evidence of acute fracture to the cervical spine. Soft tissues and spinal canal: No prevertebral fluid or swelling. No visible canal hematoma. Disc levels: Cervical spondylosis with multilevel disc space narrowing, disc bulges/central disc protrusions, posterior disc osteophyte complexes, uncovertebral hypertrophy and facet arthrosis. Disc space narrowing is greatest at C5-C6 and C6-C7 (advanced at these  levels). No appreciable high-grade spinal canal stenosis. Multilevel bony neural foraminal narrowing. Degenerative changes also present at the C1-C2 articulation. Upper chest: No consolidation within the imaged lung apices. Emphysema. Postoperative changes to the left lung apex. Biapical pleuroparenchymal scarring. IMPRESSION: CT head: 1. No evidence of an acute intracranial abnormality. 2. Right parietal scalp hematoma. CT maxillofacial: 1. No evidence of an acute maxillofacial fracture. 2. Left forehead and bilateral periorbital hematomas. CT cervical spine: 1. No evidence of an acute cervical spine fracture. 2. Mild multilevel grade 1 spondylolisthesis. 3. Cervical spondylosis as described. 4. Emphysema (ICD10-J43.9). Electronically Signed: By: Jackey Loge D.O. On: 09/19/2022 08:44   CT Maxillofacial WO CM  Addendum Date: 09/19/2022   ADDENDUM REPORT: 09/19/2022 15:11 ADDENDUM: Upon further review, there is a small displaced fracture of the anteroinferior corner of the C6 vertebral body (series 5, image 33). This was present on the prior cervical spine CT of 09/15/2022, but is new from the prior cervical spine CT of 03/24/2022. A cervical spine MRI is recommended to exclude an associated anterior longitudinal ligament injury. These results were called by telephone at the  time of interpretation on 09/19/2022 at 3:10 pm to provider [facility] , who verbally acknowledged these results. Electronically Signed   By: Jackey Loge D.O.   On: 09/19/2022 15:11   Result Date: 09/19/2022 CLINICAL DATA:  Provided history: Head trauma, minor. Facial trauma, blunt. Neck trauma. Multiple falls (with head trauma), swelling to forehead, periorbital bruising. EXAM: CT HEAD WITHOUT CONTRAST CT MAXILLOFACIAL WITHOUT CONTRAST CT CERVICAL SPINE WITHOUT CONTRAST TECHNIQUE: Multidetector CT imaging of the head, cervical spine, and maxillofacial structures were performed using the standard protocol without intravenous contrast.  Multiplanar CT image reconstructions of the cervical spine and maxillofacial structures were also generated. RADIATION DOSE REDUCTION: This exam was performed according to the departmental dose-optimization program which includes automated exposure control, adjustment of the mA and/or kV according to patient size and/or use of iterative reconstruction technique. COMPARISON:  Head CT 09/15/2022. Maxillofacial CT 09/15/2022. Maxillofacial CT 01/10/2022. Cervical spine CT 09/15/2022. FINDINGS: CT HEAD FINDINGS Brain: Generalized cerebral atrophy. Redemonstrated large focus of chronic encephalomalacia/gliosis within the right parietal and temporal lobes (deep to a cranioplasty). Background patchy and ill-defined hypoattenuation within the cerebral white matter, nonspecific but compatible with advanced chronic small vessel ischemic disease. There is no acute intracranial hemorrhage. No acute demarcated cortical infarct. No extra-axial fluid collection. No evidence of an intracranial mass. No midline shift. Vascular: No hyperdense vessel.  Atherosclerotic calcifications. Skull: Right parietal cranioplasty.  No acute calvarial fracture. Other: Right parietal scalp hematoma. CT MAXILLOFACIAL FINDINGS Osseous: Redemonstrated chronic nasal bone fracture deformity. No acute maxillofacial fracture is identified. Orbits: Bilateral periorbital hematomas. No acute finding within the orbits. Sinuses: No significant paranasal sinus disease. Soft tissues: Left forehead and bilateral periorbital hematomas. CT CERVICAL SPINE FINDINGS Alignment: Mild C2-C3 grade 1 retrolisthesis. Mild grade 1 anterolisthesis at C4-C5, C6-C7, C7-T1, T1-T2 and T2-T3. Skull base and vertebrae: The basion-dental and atlanto-dental intervals are maintained.No evidence of acute fracture to the cervical spine. Soft tissues and spinal canal: No prevertebral fluid or swelling. No visible canal hematoma. Disc levels: Cervical spondylosis with multilevel disc  space narrowing, disc bulges/central disc protrusions, posterior disc osteophyte complexes, uncovertebral hypertrophy and facet arthrosis. Disc space narrowing is greatest at C5-C6 and C6-C7 (advanced at these levels). No appreciable high-grade spinal canal stenosis. Multilevel bony neural foraminal narrowing. Degenerative changes also present at the C1-C2 articulation. Upper chest: No consolidation within the imaged lung apices. Emphysema. Postoperative changes to the left lung apex. Biapical pleuroparenchymal scarring. IMPRESSION: CT head: 1. No evidence of an acute intracranial abnormality. 2. Right parietal scalp hematoma. CT maxillofacial: 1. No evidence of an acute maxillofacial fracture. 2. Left forehead and bilateral periorbital hematomas. CT cervical spine: 1. No evidence of an acute cervical spine fracture. 2. Mild multilevel grade 1 spondylolisthesis. 3. Cervical spondylosis as described. 4. Emphysema (ICD10-J43.9). Electronically Signed: By: Jackey Loge D.O. On: 09/19/2022 08:44   DG Chest Port 1 View  Result Date: 09/19/2022 CLINICAL DATA:  Frequent falls, most unwitnessed. EXAM: PORTABLE CHEST 1 VIEW PORTABLE AP PELVIS 1 VIEW COMPARISON:  AP pelvis 03/24/2022, portable chest 03/24/2022. FINDINGS: Chest AP portable, 7:17 a.m.: Mild cardiomegaly. No vascular congestion is seen. Stable mediastinum. The aorta is heavily calcified. The lungs are emphysematous. There is postsurgical change in the left lung apex. Mild left-sided volume loss. There is no focal pneumonia or pleural collection. No pneumothorax. Chronic scarring both apices. Osteopenia and advanced degenerative change of the spine. No new osseous findings. AP pelvis, single AP portable: Osteopenia. There is no evidence of pelvic fracture, diastasis  or other focal bone lesion. Healed fracture deformities of the left pubic rami are again noted. There is mild-for-age nonerosive degenerative arthrosis symmetrically at the hips. Heavy iliofemoral  calcific arteriosclerosis. IMPRESSION: 1. No acute chest findings. Emphysema, postsurgical and chronic changes. No interval change from prior studies. 2. Heavy aortic and iliofemoral atherosclerosis. 3. Osteopenia and advanced degenerative change of the spine. No new osseous findings. Electronically Signed   By: Almira Bar M.D.   On: 09/19/2022 07:40   DG Pelvis Portable  Result Date: 09/19/2022 CLINICAL DATA:  Frequent falls, most unwitnessed. EXAM: PORTABLE CHEST 1 VIEW PORTABLE AP PELVIS 1 VIEW COMPARISON:  AP pelvis 03/24/2022, portable chest 03/24/2022. FINDINGS: Chest AP portable, 7:17 a.m.: Mild cardiomegaly. No vascular congestion is seen. Stable mediastinum. The aorta is heavily calcified. The lungs are emphysematous. There is postsurgical change in the left lung apex. Mild left-sided volume loss. There is no focal pneumonia or pleural collection. No pneumothorax. Chronic scarring both apices. Osteopenia and advanced degenerative change of the spine. No new osseous findings. AP pelvis, single AP portable: Osteopenia. There is no evidence of pelvic fracture, diastasis or other focal bone lesion. Healed fracture deformities of the left pubic rami are again noted. There is mild-for-age nonerosive degenerative arthrosis symmetrically at the hips. Heavy iliofemoral calcific arteriosclerosis. IMPRESSION: 1. No acute chest findings. Emphysema, postsurgical and chronic changes. No interval change from prior studies. 2. Heavy aortic and iliofemoral atherosclerosis. 3. Osteopenia and advanced degenerative change of the spine. No new osseous findings. Electronically Signed   By: Almira Bar M.D.   On: 09/19/2022 07:40    Procedures Procedures    Medications Ordered in ED Medications  fentaNYL (SUBLIMAZE) injection 50 mcg (has no administration in time range)    ED Course/ Medical Decision Making/ A&P Clinical Course as of 09/19/22 1749  Thu Sep 19, 2022  4098 Chest x-ray interpreted by me as  no acute traumatic findings.  Awaiting radiology reading.  Pelvis x-ray does not show any obvious acute fractures. [MB]  [id number] I updated patient's family member at the bedside.  He is comfortable with her going back to [place].  She has been tachycardic here we will look to see if she is not received any morning medications. [MB]    Clinical Course User Index [MB] Terrilee Files, MD                                 Medical Decision Making Amount and/or Complexity of Data Reviewed Labs: ordered. Radiology: ordered.  Risk Prescription drug management.   This patient complains of fall; this involves an extensive number of treatment Options and is a complaint that carries with it a high risk of complications and morbidity. The differential includes head injury, intracranial bleed, fracture, contusion, dehydration, metabolic derangement  I ordered, reviewed and interpreted labs, which included CBC with normal white count, hemoglobin elevated from priors question dehydration, chemistries with mild elevation of BUN and creatinine, urinalysis possible signs of infection I ordered medication IV fluids and pain medication and reviewed PMP when indicated. I ordered imaging studies which included CT head cervical spine max face along with x-rays of chest and pelvis and I independently    visualized and interpreted imaging which showed soft tissue hematoma no acute bleed, no fractures identified Additional history obtained from EMS and patient's family member Previous records obtained and reviewed in epic including prior ED notes  Cardiac monitoring reviewed, atrial  fibrillation with rapid ventricular response Social determinants considered, patient with dementia unable to advocate for self Critical Interventions: None  After the interventions stated above, I reevaluated the patient and found patient's blood pressure and heart rate still to be elevated although trending in the right direction  after administration of morning meds. Admission and further testing considered, no indications for admission or further workup at this time.  Patient's family member states she is minimally ambulatory and is supposed to use a walker or a wheelchair but forgets to secondary to her dementia.  She is in a dementia unit and they have been trying to increase supervision of her so she does not get up and wander.  He is comfortable with her returning back to her facility.         Final Clinical Impression(s) / ED Diagnoses Final diagnoses:  Fall, initial encounter  Injury of head, initial encounter  Scalp hematoma, initial encounter  Resistant hypertension    Rx / DC Orders ED Discharge Orders     None         Terrilee Files, MD 09/19/22 1752

## 2022-09-19 NOTE — ED Notes (Addendum)
Spoke with [place] at [facility]. Andrey Campanile was informed that patient had a C6 fracture finding on her cat scan after further review and was recommended to return here and have C-collar placed and MRI done. Nursing home staff to discuss with patient's family and hospice on rather they want her to return or not.  8/22 1535

## 2022-09-19 NOTE — ED Notes (Addendum)
Spoke with patient's son [name]. He was informed the provider recommended he bring his mother back for c-collar and MRI. He stated he would talk to her hospice nurse,but he did not feel it would benefit her to come back. 8/22 1530

## 2022-09-19 NOTE — Telephone Encounter (Signed)
I received a call from radiology that patient has a subtle C6 vertebral body fracture.  He said it was there on the 18th when she was imaged but was not identified at the time.  Patient was seen today for a fall was reimaged and images were read as negative.  Patient was sent back to her nursing facility.  Patient has dementia so unclear if having any pain.  Radiology is recommending an MRI to exclude any ligamentous injury.  I have asked the charge nurse to see if she can reach the facility to get her back care for further evaluation.

## 2022-10-17 ENCOUNTER — Emergency Department (HOSPITAL_COMMUNITY)

## 2022-10-17 ENCOUNTER — Other Ambulatory Visit: Payer: Self-pay

## 2022-10-17 ENCOUNTER — Encounter (HOSPITAL_COMMUNITY): Payer: Self-pay | Admitting: Emergency Medicine

## 2022-10-17 ENCOUNTER — Emergency Department (HOSPITAL_COMMUNITY)
Admission: EM | Admit: 2022-10-17 | Discharge: 2022-10-18 | Disposition: A | Discharge status: Home / Self Care | Attending: Emergency Medicine | Admitting: Emergency Medicine

## 2022-10-17 DIAGNOSIS — R6889 Other general symptoms and signs: Secondary | ICD-10-CM | POA: Diagnosis not present

## 2022-10-17 DIAGNOSIS — Z743 Need for continuous supervision: Secondary | ICD-10-CM | POA: Diagnosis not present

## 2022-10-17 DIAGNOSIS — J439 Emphysema, unspecified: Secondary | ICD-10-CM | POA: Diagnosis not present

## 2022-10-17 DIAGNOSIS — I10 Essential (primary) hypertension: Secondary | ICD-10-CM | POA: Diagnosis not present

## 2022-10-17 DIAGNOSIS — S5011XA Contusion of right forearm, initial encounter: Secondary | ICD-10-CM | POA: Insufficient documentation

## 2022-10-17 DIAGNOSIS — R079 Chest pain, unspecified: Secondary | ICD-10-CM | POA: Diagnosis not present

## 2022-10-17 DIAGNOSIS — Z79899 Other long term (current) drug therapy: Secondary | ICD-10-CM | POA: Diagnosis not present

## 2022-10-17 DIAGNOSIS — R519 Headache, unspecified: Secondary | ICD-10-CM | POA: Diagnosis not present

## 2022-10-17 DIAGNOSIS — M79631 Pain in right forearm: Secondary | ICD-10-CM | POA: Diagnosis not present

## 2022-10-17 DIAGNOSIS — R410 Disorientation, unspecified: Secondary | ICD-10-CM | POA: Diagnosis not present

## 2022-10-17 DIAGNOSIS — S0990XA Unspecified injury of head, initial encounter: Secondary | ICD-10-CM | POA: Diagnosis present

## 2022-10-17 DIAGNOSIS — W19XXXA Unspecified fall, initial encounter: Secondary | ICD-10-CM | POA: Insufficient documentation

## 2022-10-17 DIAGNOSIS — M542 Cervicalgia: Secondary | ICD-10-CM | POA: Diagnosis not present

## 2022-10-17 DIAGNOSIS — I2510 Atherosclerotic heart disease of native coronary artery without angina pectoris: Secondary | ICD-10-CM | POA: Insufficient documentation

## 2022-10-17 DIAGNOSIS — I672 Cerebral atherosclerosis: Secondary | ICD-10-CM | POA: Diagnosis not present

## 2022-10-17 DIAGNOSIS — S52501A Unspecified fracture of the lower end of right radius, initial encounter for closed fracture: Secondary | ICD-10-CM | POA: Diagnosis not present

## 2022-10-17 DIAGNOSIS — Z7401 Bed confinement status: Secondary | ICD-10-CM | POA: Diagnosis not present

## 2022-10-17 DIAGNOSIS — M25531 Pain in right wrist: Secondary | ICD-10-CM | POA: Diagnosis not present

## 2022-10-17 DIAGNOSIS — I499 Cardiac arrhythmia, unspecified: Secondary | ICD-10-CM | POA: Diagnosis not present

## 2022-10-17 DIAGNOSIS — S0093XA Contusion of unspecified part of head, initial encounter: Secondary | ICD-10-CM | POA: Insufficient documentation

## 2022-10-17 NOTE — ED Notes (Signed)
Patient transported to radiology

## 2022-10-17 NOTE — ED Notes (Signed)
C-collar in place

## 2022-10-17 NOTE — ED Notes (Signed)
Pt in CT/xray at this time. Family at bedside

## 2022-10-17 NOTE — ED Notes (Signed)
Pt a/w convo transport back to [facility]

## 2022-10-17 NOTE — ED Notes (Signed)
Pt taken off monitor due to beeping bothering pt- monitor on hold per family request

## 2022-10-17 NOTE — ED Notes (Signed)
CT cervical negative for fx- C-collar removed for pt comfort and family request

## 2022-10-17 NOTE — ED Notes (Signed)
Skin tear noted to right forearm- cleansed with NS, bandaid applied

## 2022-10-17 NOTE — ED Provider Notes (Signed)
[facility]PART[facility] AT [facility] Provider Note   CSN: [id number] Arrival date & time: 10/17/22  1923     History  Chief Complaint  Patient presents with   Melissa Bentley is a 89 y.o. female.  Patient has a history of coronary disease and hypertension.  Patient had a fall today.  Patient does not walk anymore and is in a wheelchair  The history is provided by the patient and the nursing home. No language interpreter was used.  Fall This is a new problem. The current episode started 3 to 5 hours ago. The problem occurs constantly. The problem has not changed since onset.Associated symptoms include chest pain and headaches. Pertinent negatives include no abdominal pain. Nothing aggravates the symptoms. Nothing relieves the symptoms. She has tried nothing for the symptoms.       Home Medications Prior to Admission medications   Medication Sig Start Date End Date Taking? Authorizing Provider  acetaminophen (TYLENOL) 500 MG tablet Take 500 mg by mouth every 4 (four) hours as needed for mild pain, fever or headache.    [provider]  alum & mag hydroxide-simeth (MAALOX/MYLANTA) 200-200-20 MG/5ML suspension Take 30 mLs by mouth every 6 (six) hours as needed for indigestion or heartburn.    [provider]  [employer]) OINT Apply 1 application topically as directed. Apply to coccyx, sacrum and bilateral buttocks every shift.    [provider]  clonazePAM (KLONOPIN) 0.5 MG tablet Take 0.5 tablets (0.25 mg total) by mouth every 12 (twelve) hours as needed for anxiety. 05/14/21   Vassie Loll, MD  DULoxetine (CYMBALTA) 30 MG capsule Take 30 mg by mouth daily. 02/13/21   [provider]  feeding supplement (ENSURE ENLIVE / ENSURE PLUS) LIQD Take 237 mLs by mouth 2 (two) times daily between meals. 05/14/21   Vassie Loll, MD  fluticasone (FLONASE) 50 MCG/ACT nasal spray Place 2 sprays into both  nostrils daily. 08/17/21   [provider]  folic acid (FOLVITE) 1 MG tablet Take 1 tablet (1 mg total) by mouth daily. 03/06/21   Almon Hercules, MD  guaiFENesin (ROBITUSSIN) 100 MG/5ML liquid Take 5 mLs by mouth every 6 (six) hours as needed for cough or to loosen phlegm.    [provider]  isosorbide mononitrate (ISMO) 10 MG tablet Take 0.5 tablets (5 mg total) by mouth 3 (three) times daily before meals. 03/05/21   Almon Hercules, MD  levothyroxine (SYNTHROID) 25 MCG tablet Take 25 mcg by mouth daily. 09/05/22   [provider]  metoprolol tartrate (LOPRESSOR) 25 MG tablet Take 0.5 tablets (12.5 mg total) by mouth 2 (two) times daily. 03/05/21   Almon Hercules, MD  midodrine (PROAMATINE) 2.5 MG tablet Take 1 tablet (2.5 mg total) by mouth 3 (three) times daily with meals. 03/05/21   Almon Hercules, MD  mirtazapine (REMERON) 7.5 MG tablet Take 1 tablet (7.5 mg total) by mouth at bedtime. 05/14/21   Vassie Loll, MD  mupirocin ointment (BACTROBAN) 2 % Apply 1 Application topically daily. 07/17/21   [provider]  ondansetron (ZOFRAN-ODT) 8 MG disintegrating tablet Take 0.5 tablets (4 mg total) by mouth every 8 (eight) hours as needed for nausea or vomiting. 05/14/21   Vassie Loll, MD  Vitamin D, Ergocalciferol, (DRISDOL) 1.25 MG (50000 UNIT) CAPS capsule Take 50,000 Units by mouth once a week. 08/11/22   [provider]      Allergies  Patient has no known allergies.    Review of Systems   Review of Systems  Constitutional:  Negative for appetite change and fatigue.  HENT:  Negative for congestion, ear discharge and sinus pressure.   Eyes:  Negative for discharge.  Respiratory:  Negative for cough.   Cardiovascular:  Positive for chest pain.  Gastrointestinal:  Negative for abdominal pain and diarrhea.  Genitourinary:  Negative for frequency and hematuria.  Musculoskeletal:  Negative for back pain.  Skin:  Negative for rash.  Neurological:  Positive  for headaches. Negative for seizures.  Psychiatric/Behavioral:  Negative for hallucinations.     Physical Exam Updated Vital Signs BP (!) 152/115   Pulse (!) 114   Temp 97.9 F (36.6 C) (Axillary)   Resp 18   Ht 5' (1.524 m)   Wt 50 kg   SpO2 93%   BMI 21.53 kg/m  Physical Exam Vitals and nursing note reviewed.  Constitutional:      Appearance: She is well-developed.  HENT:     Head: Normocephalic.     Comments: Bruising to head Eyes:     General: No scleral icterus.    Conjunctiva/sclera: Conjunctivae normal.  Neck:     Thyroid: No thyromegaly.  Cardiovascular:     Rate and Rhythm: Normal rate and regular rhythm.     Heart sounds: No murmur heard.    No friction rub. No gallop.  Pulmonary:     Breath sounds: No stridor. No wheezing or rales.  Abdominal:     General: There is no distension.     Tenderness: There is no abdominal tenderness. There is no rebound.  Musculoskeletal:     Cervical back: Neck supple.     Comments: Tender right forearm  Lymphadenopathy:     Cervical: No cervical adenopathy.  Skin:    Findings: No erythema or rash.  Neurological:     Mental Status: She is oriented to person, place, and time.     Motor: No abnormal muscle tone.     Coordination: Coordination normal.  Psychiatric:        Behavior: Behavior normal.     ED Results / Procedures / Treatments   Labs (all labs ordered are listed, but only abnormal results are displayed) Labs Reviewed - No data to display  EKG None  Radiology CT Head Wo Contrast  Result Date: 10/17/2022 CLINICAL DATA:  Unwitnessed fall, head and neck trauma, pain EXAM: CT HEAD WITHOUT CONTRAST CT CERVICAL SPINE WITHOUT CONTRAST TECHNIQUE: Multidetector CT imaging of the head and cervical spine was performed following the standard protocol without intravenous contrast. Multiplanar CT image reconstructions of the cervical spine were also generated. RADIATION DOSE REDUCTION: This exam was performed according  to the departmental dose-optimization program which includes automated exposure control, adjustment of the mA and/or kV according to patient size and/or use of iterative reconstruction technique. COMPARISON:  09/19/2022 CT head and cervical spine FINDINGS: CT HEAD FINDINGS Brain: No evidence of acute infarct, hemorrhage, mass, mass effect, or midline shift. No hydrocephalus or extra-axial fluid collection. Redemonstrated encephalomalacia in the right temporal and parietal lobes. Periventricular white matter changes, likely the sequela of chronic small vessel ischemic disease. Vascular: No hyperdense vessel. Atherosclerotic calcifications in the intracranial carotid and vertebral arteries. Skull: Negative for fracture or focal lesion. Prior right parietal craniotomy. Sinuses/Orbits: No acute finding. Other: The mastoid air cells are well aerated. Interval decrease in the size of previously noted left periorbital hematoma. Previously noted right parietal hematoma is no longer  seen. No new hematoma. CT CERVICAL SPINE FINDINGS Alignment: No traumatic listhesis. Skull base and vertebrae: No acute fracture or suspicious osseous lesion. Soft tissues and spinal canal: No prevertebral fluid or swelling. No visible canal hematoma. Disc levels: Degenerative changes in the cervical spine. No high-grade spinal canal stenosis. Upper chest: Emphysema and scarring in the bilateral apices. No new focal pulmonary opacity or pleural effusion. IMPRESSION: 1. No acute intracranial process. 2. No acute fracture or traumatic listhesis in the cervical spine. Electronically Signed   By: Wiliam Ke M.D.   On: 10/17/2022 21:09   CT Cervical Spine Wo Contrast  Result Date: 10/17/2022 CLINICAL DATA:  Unwitnessed fall, head and neck trauma, pain EXAM: CT HEAD WITHOUT CONTRAST CT CERVICAL SPINE WITHOUT CONTRAST TECHNIQUE: Multidetector CT imaging of the head and cervical spine was performed following the standard protocol without  intravenous contrast. Multiplanar CT image reconstructions of the cervical spine were also generated. RADIATION DOSE REDUCTION: This exam was performed according to the departmental dose-optimization program which includes automated exposure control, adjustment of the mA and/or kV according to patient size and/or use of iterative reconstruction technique. COMPARISON:  09/19/2022 CT head and cervical spine FINDINGS: CT HEAD FINDINGS Brain: No evidence of acute infarct, hemorrhage, mass, mass effect, or midline shift. No hydrocephalus or extra-axial fluid collection. Redemonstrated encephalomalacia in the right temporal and parietal lobes. Periventricular white matter changes, likely the sequela of chronic small vessel ischemic disease. Vascular: No hyperdense vessel. Atherosclerotic calcifications in the intracranial carotid and vertebral arteries. Skull: Negative for fracture or focal lesion. Prior right parietal craniotomy. Sinuses/Orbits: No acute finding. Other: The mastoid air cells are well aerated. Interval decrease in the size of previously noted left periorbital hematoma. Previously noted right parietal hematoma is no longer seen. No new hematoma. CT CERVICAL SPINE FINDINGS Alignment: No traumatic listhesis. Skull base and vertebrae: No acute fracture or suspicious osseous lesion. Soft tissues and spinal canal: No prevertebral fluid or swelling. No visible canal hematoma. Disc levels: Degenerative changes in the cervical spine. No high-grade spinal canal stenosis. Upper chest: Emphysema and scarring in the bilateral apices. No new focal pulmonary opacity or pleural effusion. IMPRESSION: 1. No acute intracranial process. 2. No acute fracture or traumatic listhesis in the cervical spine. Electronically Signed   By: Wiliam Ke M.D.   On: 10/17/2022 21:09   DG Pelvis Portable  Result Date: 10/17/2022 CLINICAL DATA:  Recent fall with pelvic pain, initial encounter EXAM: PORTABLE PELVIS 1-2 VIEWS  COMPARISON:  09/19/2022 FINDINGS: Old healed left superior and inferior pubic rami fractures are noted. Pelvic ring is otherwise intact. No acute fracture is noted. No soft tissue changes are seen. IMPRESSION: Chronic fractures on the left as described. No acute abnormality noted. Electronically Signed   By: Alcide Clever M.D.   On: 10/17/2022 20:57   DG Forearm Right  Result Date: 10/17/2022 CLINICAL DATA:  Recent fall with forearm pain, initial encounter EXAM: RIGHT FOREARM - 2 VIEW COMPARISON:  03/24/2022 FINDINGS: Healed distal radial fracture is again noted and stable. No acute fracture or dislocation is seen. No soft tissue abnormality is noted. IMPRESSION: Chronic changes without acute abnormality. Electronically Signed   By: Alcide Clever M.D.   On: 10/17/2022 20:56    Procedures Procedures    Medications Ordered in ED Medications - No data to display  ED Course/ Medical Decision Making/ A&P  CT scan of the head and neck and plain films of the pelvic and right arm are negative.  Medical Decision Making Amount and/or Complexity of Data Reviewed Radiology: ordered.   Patient with a fall and contusion to right forearm and head.  Patient will take Tylenol follow-up as needed        Final Clinical Impression(s) / ED Diagnoses Final diagnoses:  Fall, initial encounter    Rx / DC Orders ED Discharge Orders     None         Bethann Berkshire, MD November 12, 2022 1400

## 2022-10-17 NOTE — Discharge Instructions (Signed)
Take Tylenol for pain follow-up with your doctor if any problems

## 2022-10-17 NOTE — ED Notes (Signed)
Pt alert to self and situation, pt states her son's name who is at bedside, pt at baseline per family-pt has C-collar in place

## 2022-10-17 NOTE — ED Triage Notes (Signed)
Pt BIB [facility] from [facility] dementia unit for unwitnessed fall. Pt had another fall earlier today with no injury. Per EMS pt had been down fro approx 25 mins this evening after fall. Pt has skin tear to right elbow and left hand. Pt told EMS head hurt and had c collar placed. EMS states that pt moaned in pain at every touch. Pt alert to self and DOB. Pt is a hospice pt. Hospice was notifed by facility and advised to come to ER for evaluation.

## 2022-10-29 DEATH — deceased

## 2023-03-19 IMAGING — DX DG ANKLE COMPLETE 3+V*L*
3 series · 3 of 3 positions shown · non-contrast
Comparison: None.

CLINICAL DATA: Left leg pain.  Bruising.  Fall?

EXAM:
LEFT ANKLE COMPLETE - 3+ VIEW

[ankle ap]
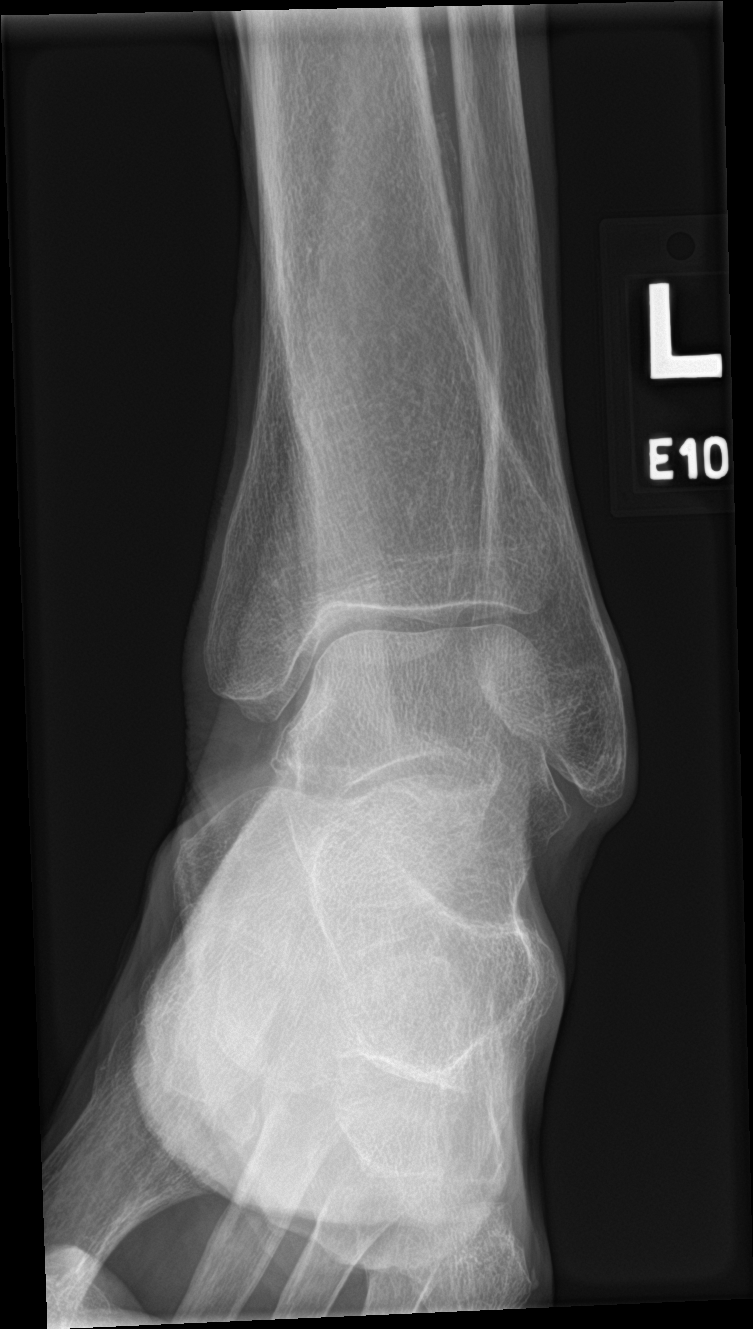

[ankle obl]
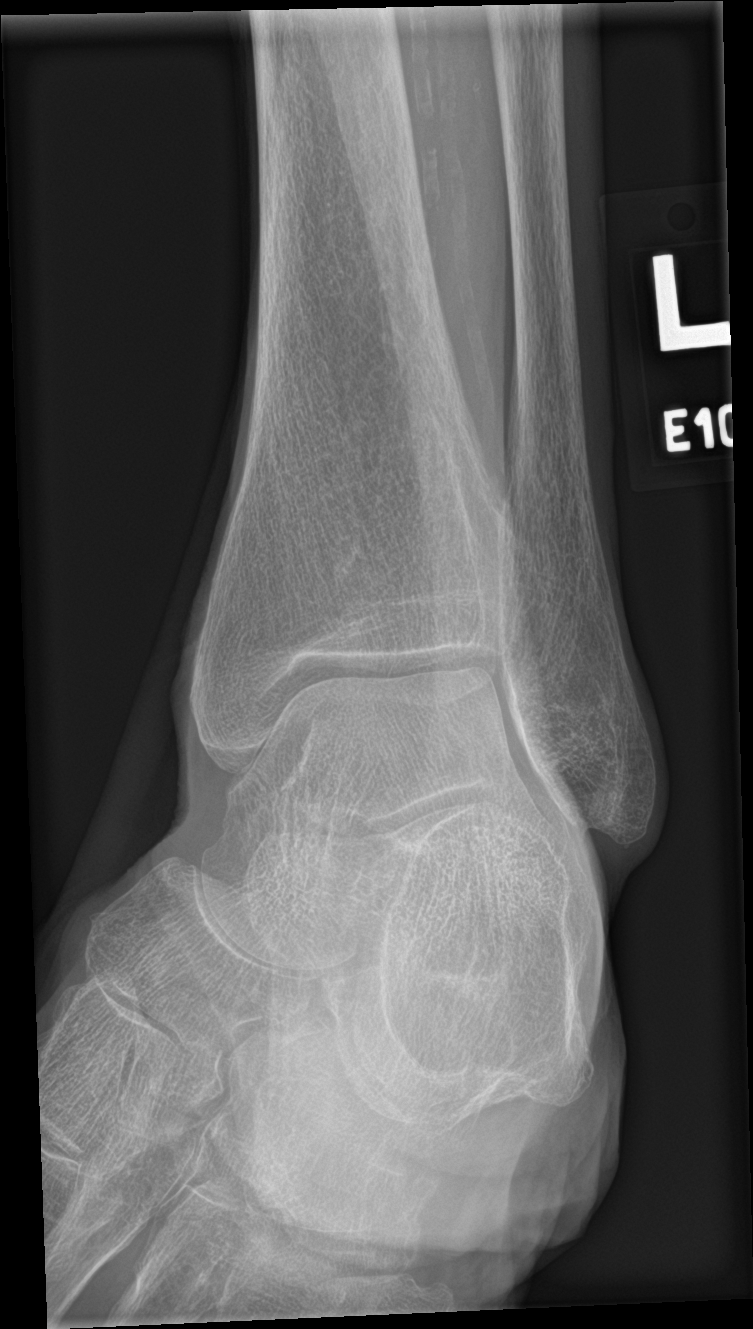

[ankle lat]
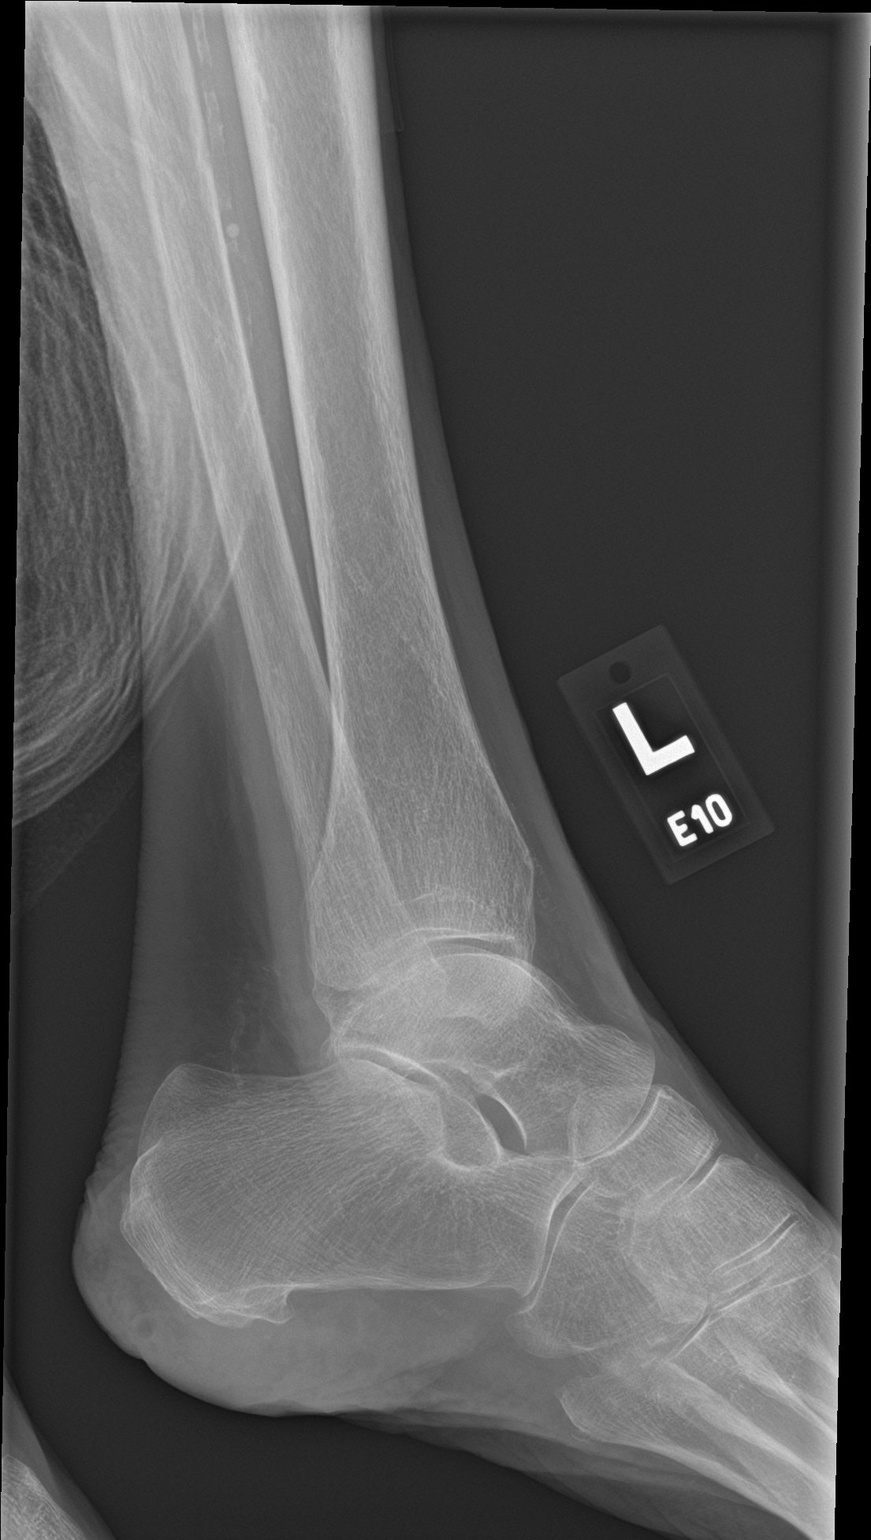

[3 of 3 positions shown; findings below may reference images not displayed]

FINDINGS: There is no evidence of fracture, dislocation, or joint effusion.
The bones are under mineralized. Ankle mortise is preserved. Intact
talar dome. There is no evidence of arthropathy or other focal bone
abnormality. Soft tissues are unremarkable.
IMPRESSION: No fracture or subluxation of the left ankle.

## 2023-03-19 IMAGING — CT CT PELVIS W/O CM
2 of 3 series · 16 of 46 positions shown, 18 images · non-contrast
Comparison: LEFT hip radiographs 02/21/2021, CT pelvis 12/15/2020

CLINICAL DATA: LEFT hip pain after fall at home today, abnormal
radiographs demonstrating pelvic fracture

EXAM:
CT PELVIS WITHOUT CONTRAST
TECHNIQUE: Multidetector CT imaging of the pelvis was performed following the
standard protocol without intravenous contrast.
RADIATION DOSE REDUCTION: This exam was performed according to the
departmental dose-optimization program which includes automated
exposure control, adjustment of the mA and/or kV according to
patient size and/or use of iterative reconstruction technique.

[Series 5: 3 axial soft · axial · 0.72mm/px · z∈[+582,+826]mm · 13 of 142 slices shown, 15 images]
[im 10/142  soft-tissue]
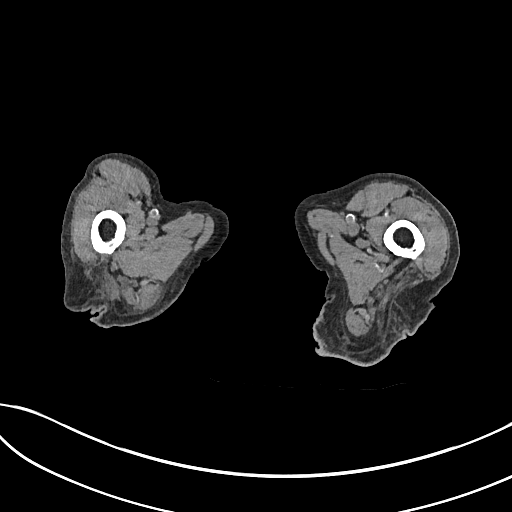
[im 10/142  bone]
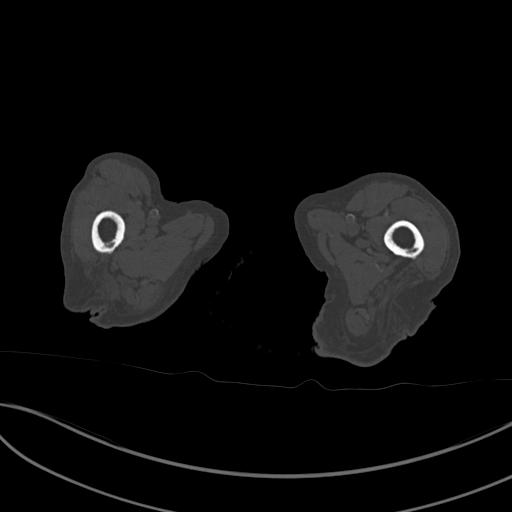
[im 19/142  soft-tissue]
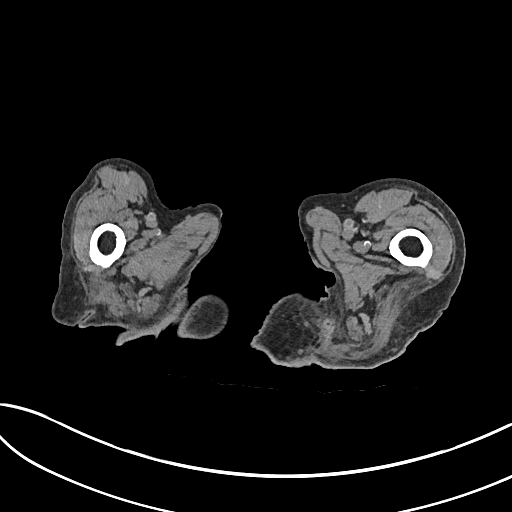
[im 28/142  soft-tissue]
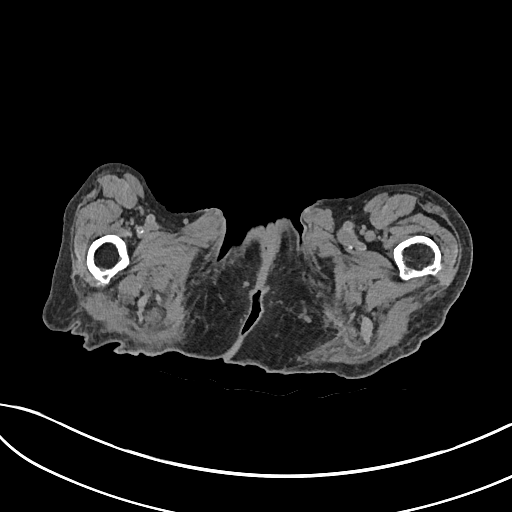
[im 41/142  soft-tissue]
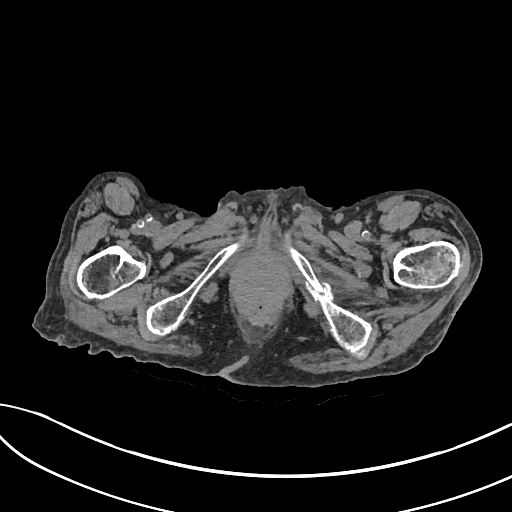
[im 51/142  soft-tissue]
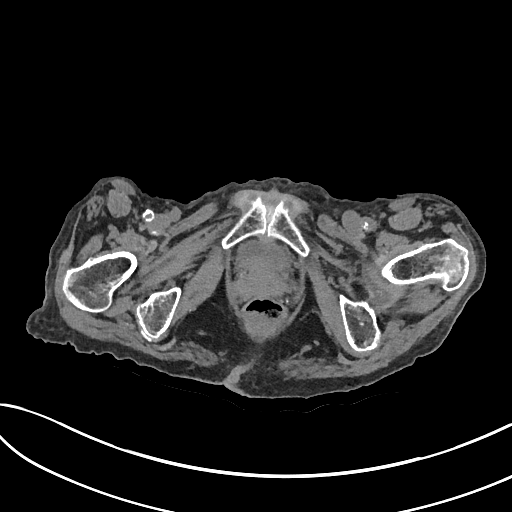
[im 60/142  soft-tissue]
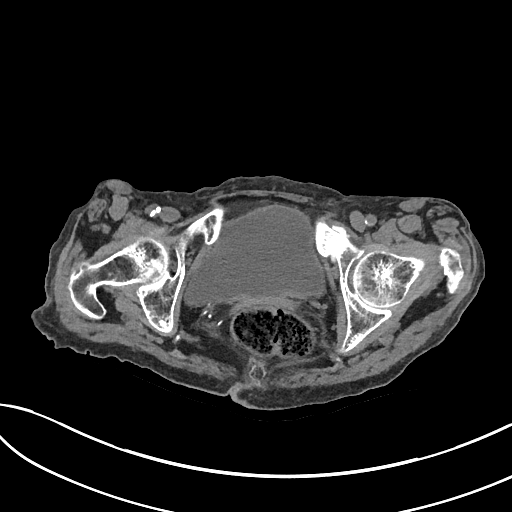
[im 73/142  soft-tissue]
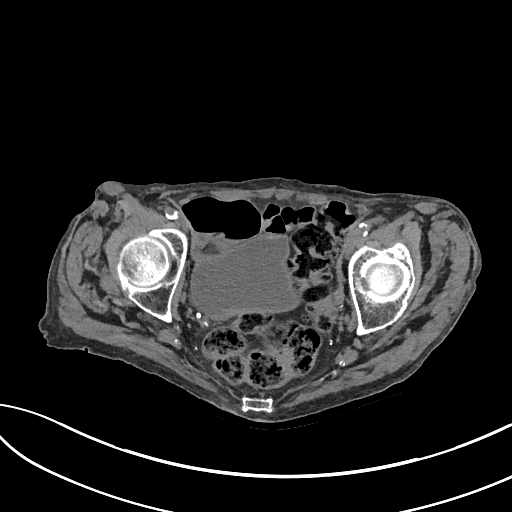
[im 82/142  soft-tissue]
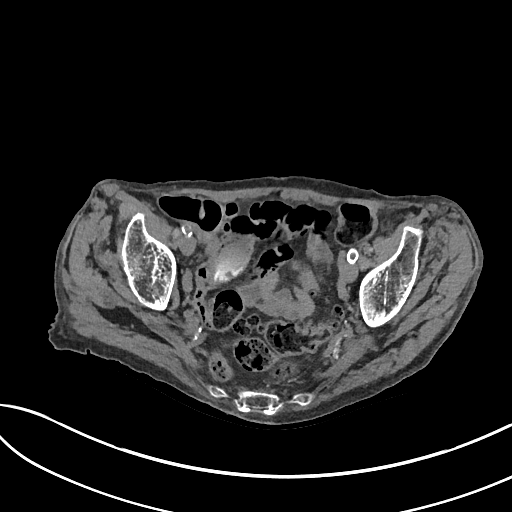
[im 91/142  soft-tissue]
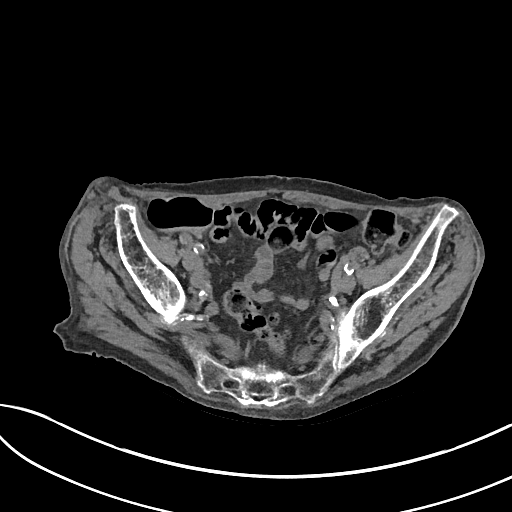
[im 91/142  bone]
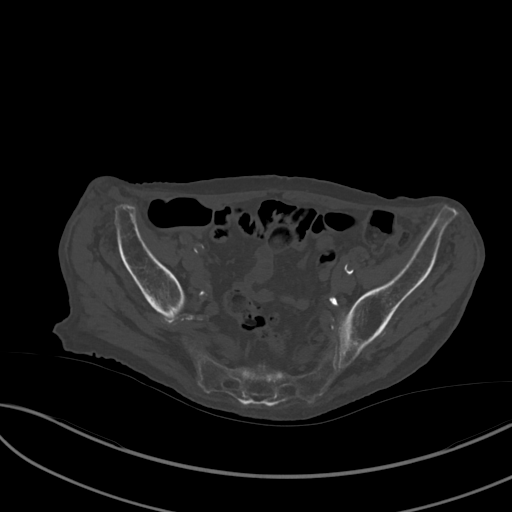
[im 101/142  soft-tissue]
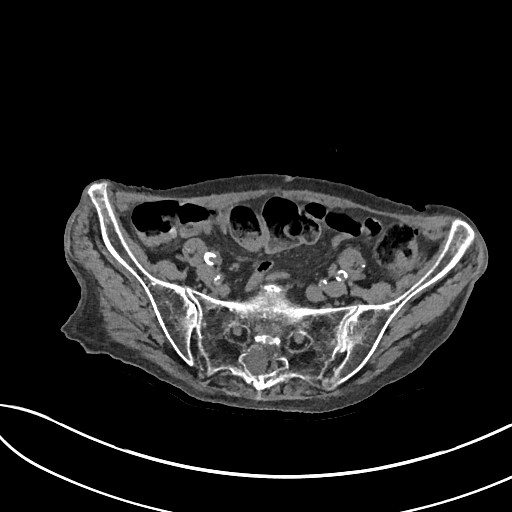
[im 114/142  soft-tissue]
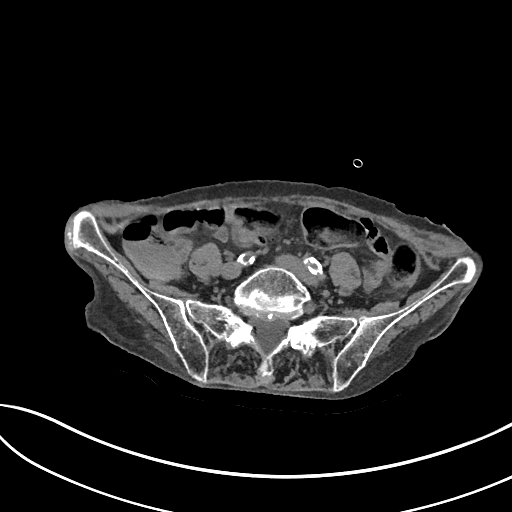
[im 123/142  soft-tissue]
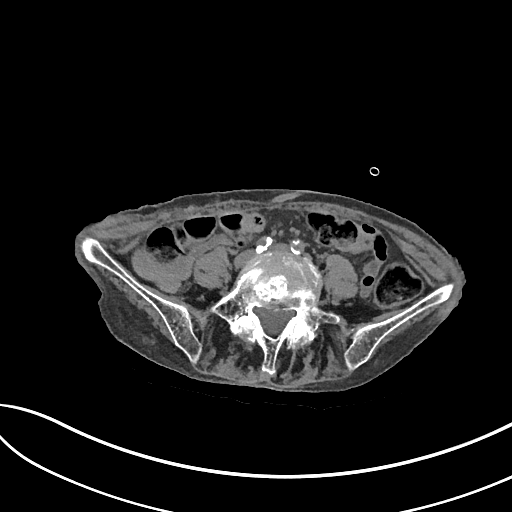
[im 132/142  soft-tissue]
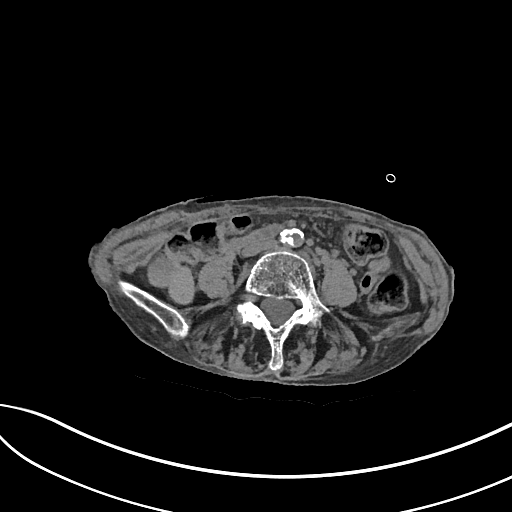

[Series 8: coronal st · coronal · 0.56mm/px · 3 of 102 slices shown]
[im 34/102  soft-tissue]
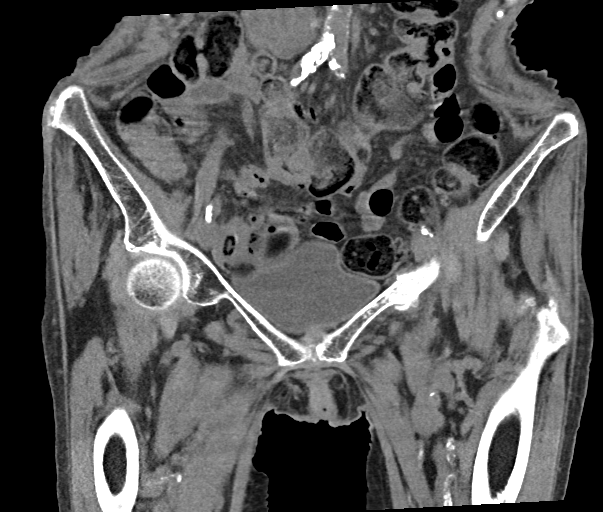
[im 45/102  soft-tissue]
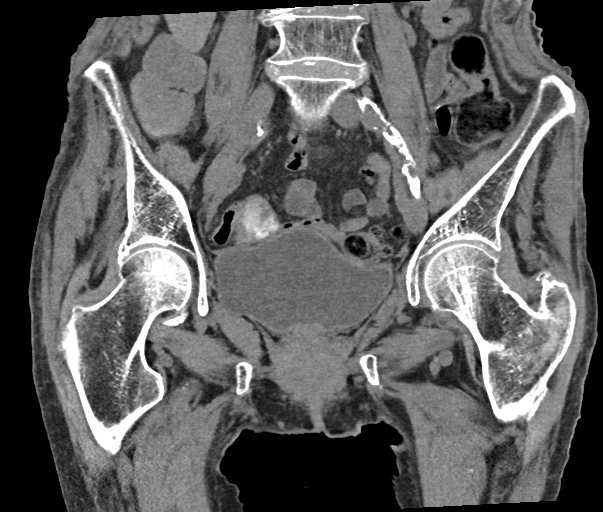
[im 57/102  soft-tissue]
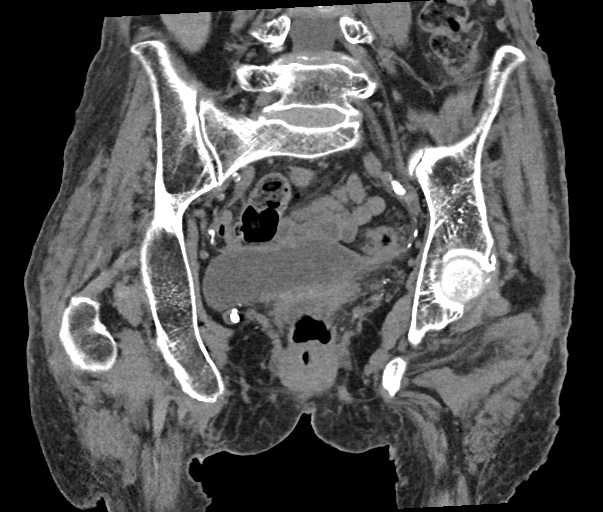

[16 of 46 positions shown; findings below may reference images not displayed]

FINDINGS: Urinary Tract:  Distal ureters and bladder unremarkable.

Bowel: Slightly increased stool in distal colon. Minimal distal
colonic diverticulosis. Pelvic bowel loops otherwise unremarkable.

Vascular/Lymphatic: Dense atherosclerotic calcifications aorta,
iliac arteries, femoral arteries. No adenopathy.

Reproductive:  Uterus surgically absent.  Unremarkable ovaries.

Other:  No free air or free fluid.  No hernia.

Musculoskeletal: Marked osseous demineralization. Degenerative facet
and disc disease changes at lower lumbar spine. Sacrum intact. Hip
and SI joint spaces preserved. Subacute healing fractures of the
RIGHT superior and inferior pubic rami identified, demonstrating
sclerosis and callus. Femora intact. No femoral fracture or
dislocation.
IMPRESSION: Subacute healing fractures of the RIGHT superior and inferior pubic
rami.

The LEFT inferior pubic ramus fracture was present on the prior CT
of 12/15/2020.

The LEFT superior pubic ramus fracture was not seen on the previous
study but is subacute.

Minimal distal colonic diverticulosis.

Aortic Atherosclerosis ([id number]).

## 2023-03-19 IMAGING — DX DG HIP (WITH OR WITHOUT PELVIS) 2-3V*L*
3 series · 3 of 3 positions shown · non-contrast
Comparison: Pelvis CT 12/15/2020

CLINICAL DATA: Left leg pain.  Bruising.  Fall?

EXAM:
DG HIP (WITH OR WITHOUT PELVIS) 2-3V LEFT

[pelvis ap]
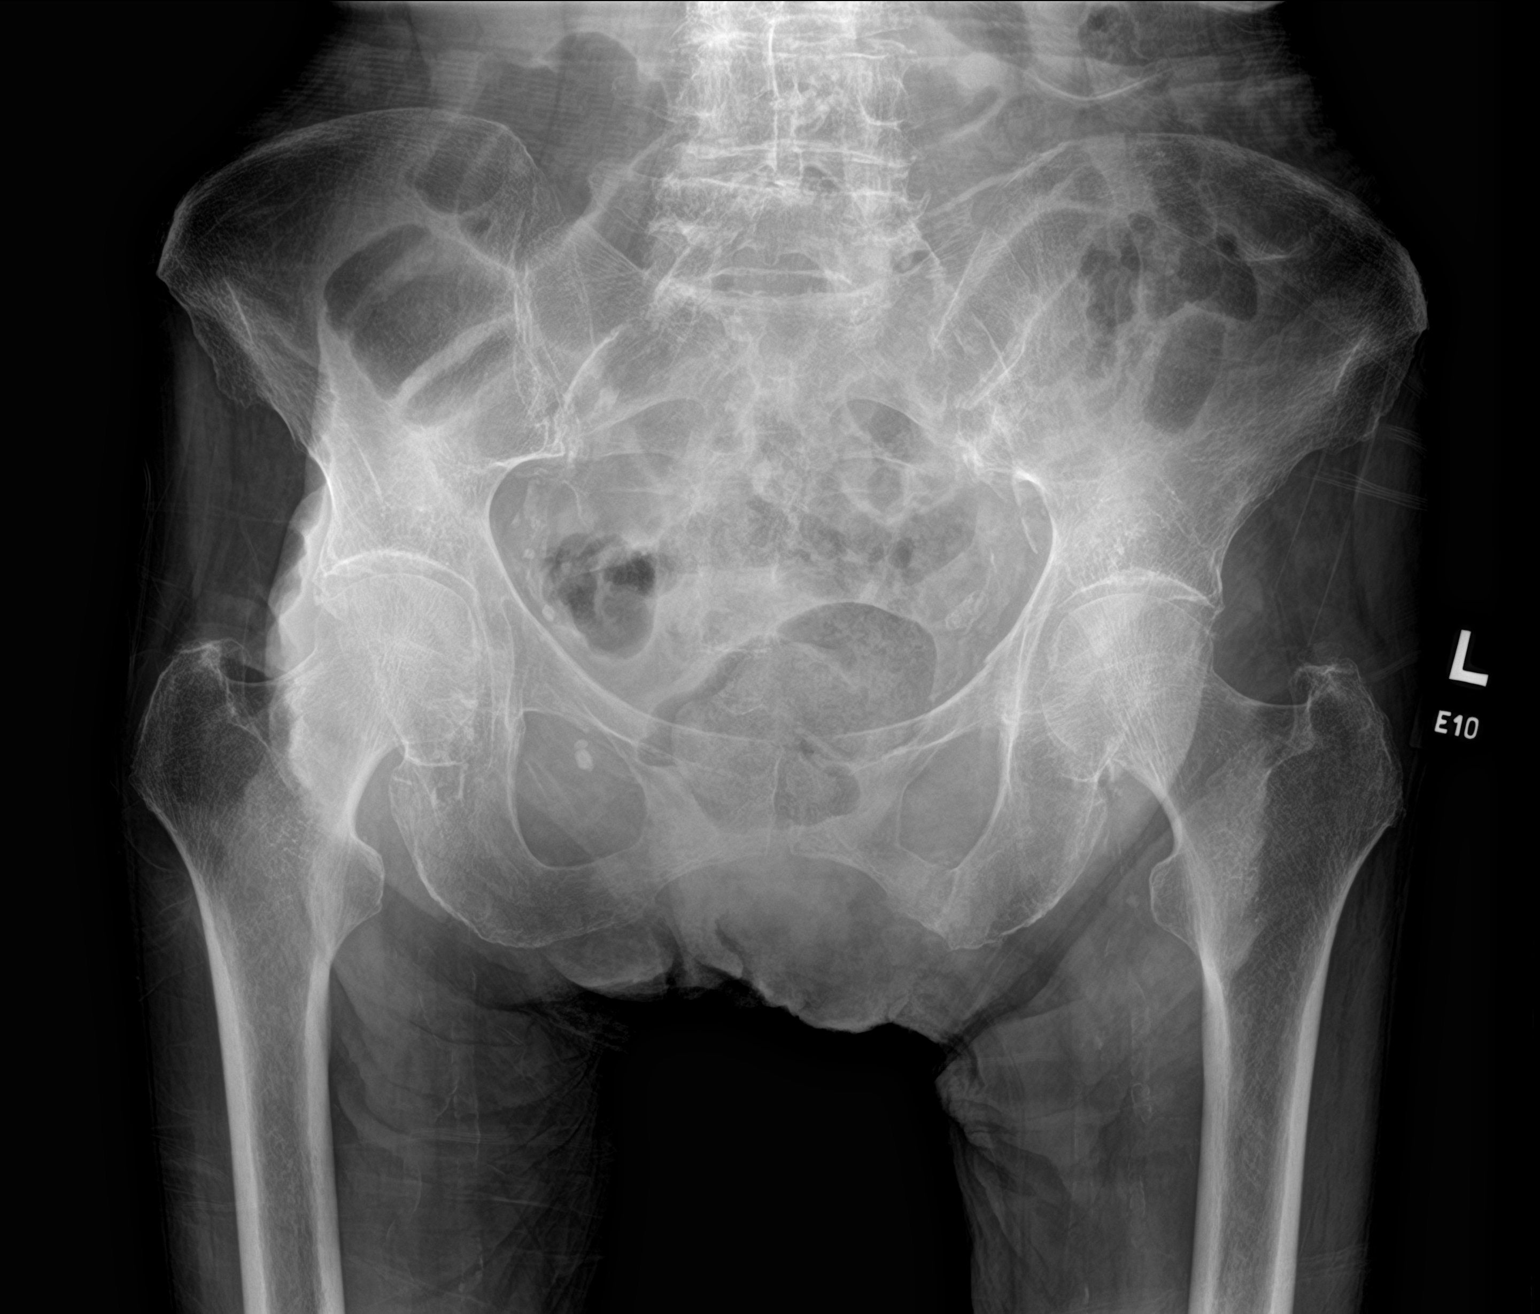

[hip ap]
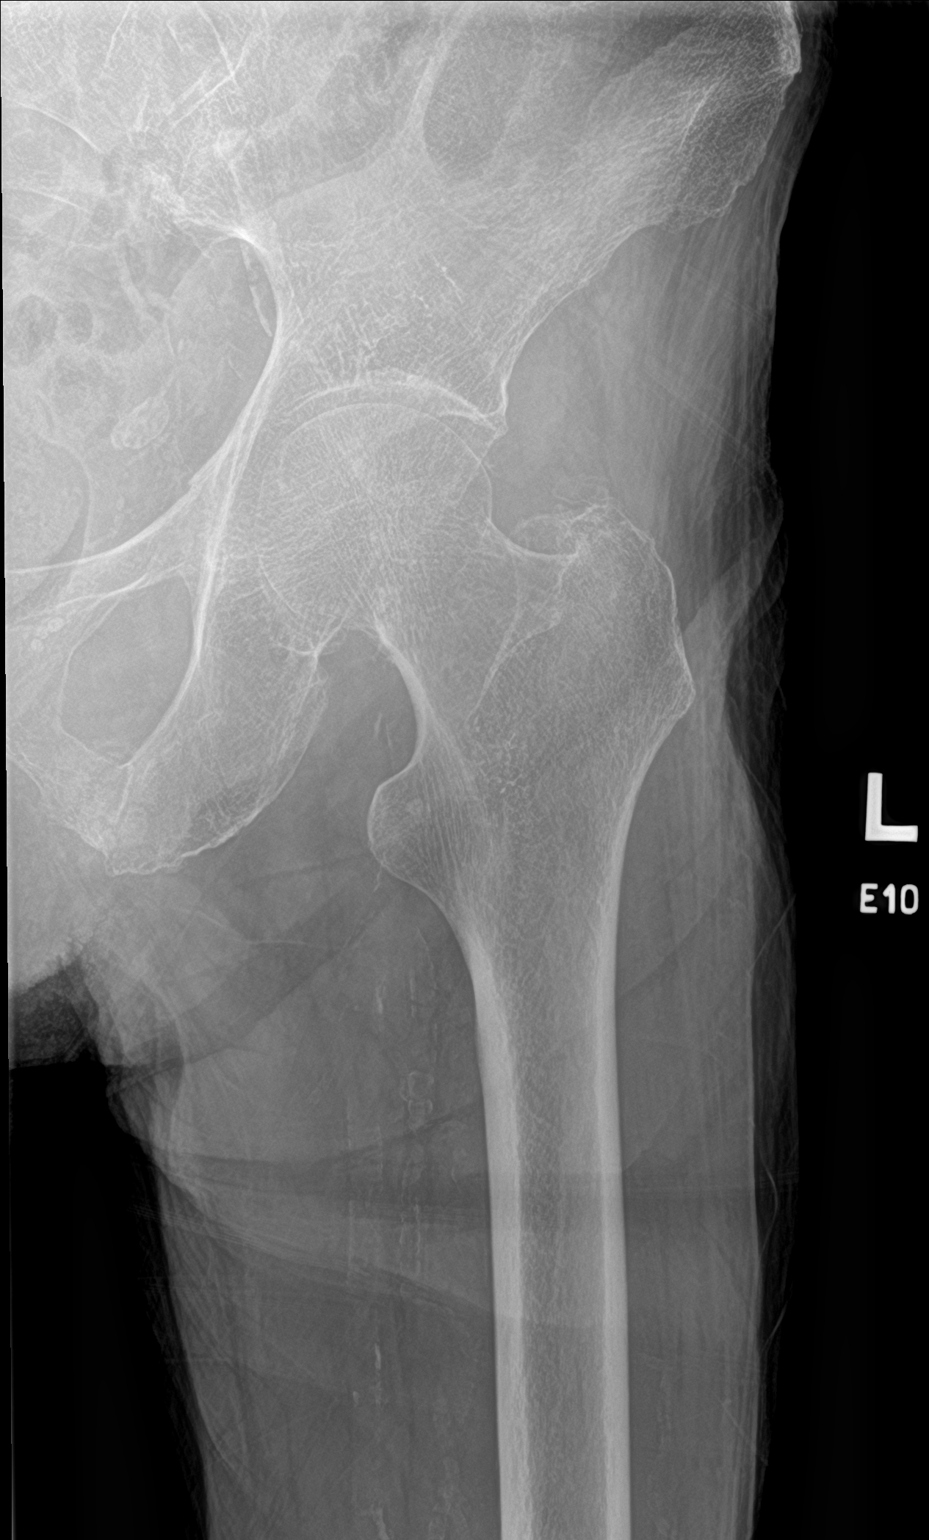

[hip frog leg]
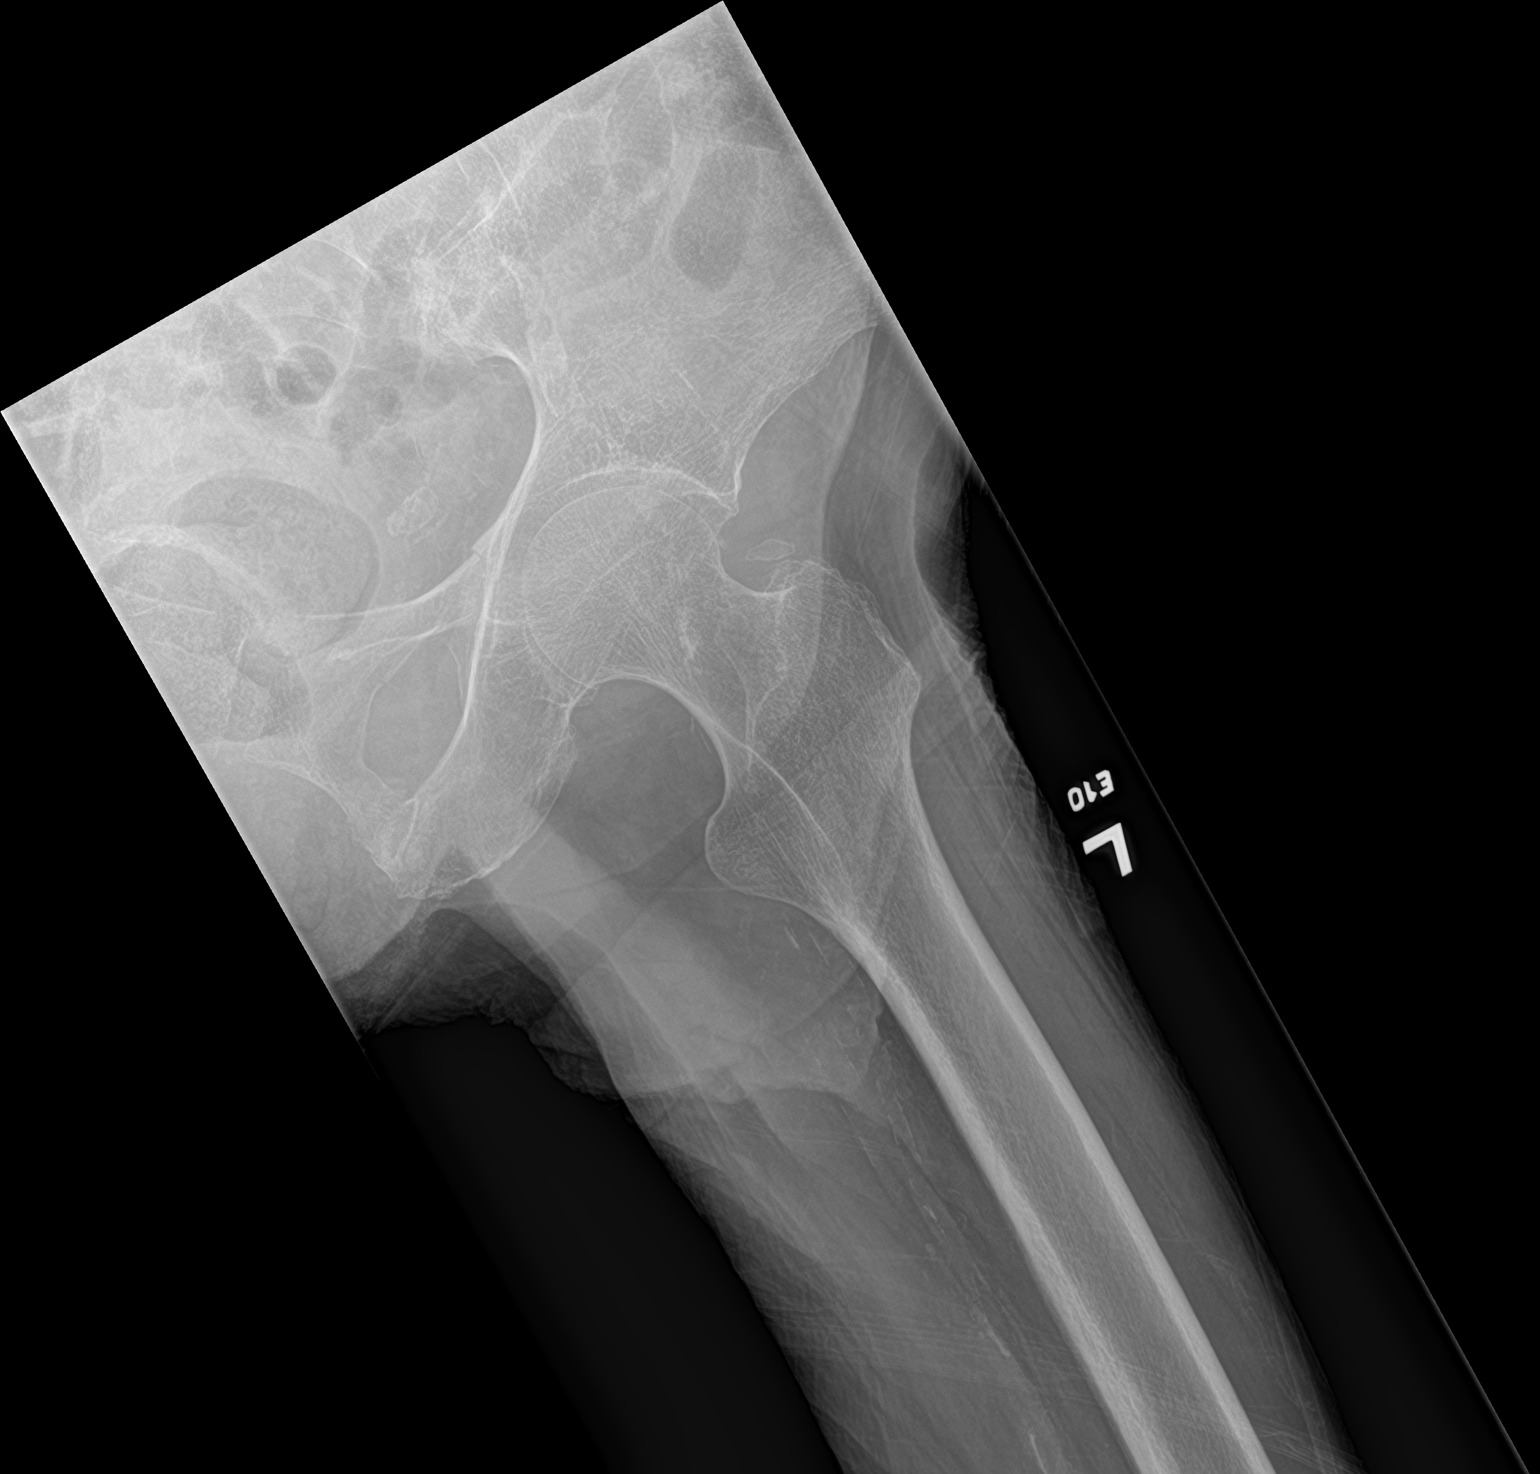

[3 of 3 positions shown; findings below may reference images not displayed]

FINDINGS: Nondisplaced left acetabular fracture is the puboacetabular
junction. Nondisplaced left inferior pubic ramus fracture with
callus formation, also seen on prior CT. Femoral head remains
seated. Normal hip joint space. The bones are diffusely under
mineralized. No evidence of avascular necrosis or erosion. Vascular
calcifications. Soft tissues are atrophic.
IMPRESSION: 1. Nondisplaced left acetabular fracture at the puboacetabular
junction, possibly acute, not definitively seen on prior pelvic CT.
2. Nondisplaced left inferior pubic ramus fracture with callus
formation is subacute.
